# Patient Record
Sex: Female | Born: 1950 | Race: White | Hispanic: No | Smoking: Former smoker
Health system: Southern US, Community
[De-identification: ages and names within clinical notes are randomized; demographics above are authoritative.]

## PROBLEM LIST (undated history)

## (undated) DIAGNOSIS — G4700 Insomnia, unspecified: Secondary | ICD-10-CM

## (undated) DIAGNOSIS — R112 Nausea with vomiting, unspecified: Secondary | ICD-10-CM

## (undated) DIAGNOSIS — R059 Cough, unspecified: Secondary | ICD-10-CM

## (undated) DIAGNOSIS — S36039A Unspecified laceration of spleen, initial encounter: Secondary | ICD-10-CM

## (undated) DIAGNOSIS — F32A Depression, unspecified: Secondary | ICD-10-CM

## (undated) DIAGNOSIS — I509 Heart failure, unspecified: Secondary | ICD-10-CM

## (undated) DIAGNOSIS — J986 Disorders of diaphragm: Secondary | ICD-10-CM

## (undated) DIAGNOSIS — K760 Fatty (change of) liver, not elsewhere classified: Secondary | ICD-10-CM

## (undated) DIAGNOSIS — G4730 Sleep apnea, unspecified: Secondary | ICD-10-CM

## (undated) DIAGNOSIS — N289 Disorder of kidney and ureter, unspecified: Secondary | ICD-10-CM

## (undated) DIAGNOSIS — I10 Essential (primary) hypertension: Secondary | ICD-10-CM

## (undated) DIAGNOSIS — M109 Gout, unspecified: Secondary | ICD-10-CM

## (undated) DIAGNOSIS — E871 Hypo-osmolality and hyponatremia: Secondary | ICD-10-CM

## (undated) DIAGNOSIS — J449 Chronic obstructive pulmonary disease, unspecified: Secondary | ICD-10-CM

## (undated) DIAGNOSIS — F329 Major depressive disorder, single episode, unspecified: Secondary | ICD-10-CM

## (undated) DIAGNOSIS — D649 Anemia, unspecified: Secondary | ICD-10-CM

## (undated) DIAGNOSIS — Z9889 Other specified postprocedural states: Secondary | ICD-10-CM

## (undated) DIAGNOSIS — R42 Dizziness and giddiness: Secondary | ICD-10-CM

## (undated) DIAGNOSIS — R05 Cough: Secondary | ICD-10-CM

## (undated) DIAGNOSIS — K769 Liver disease, unspecified: Secondary | ICD-10-CM

## (undated) HISTORY — DX: Sleep apnea, unspecified: G47.30

## (undated) HISTORY — DX: Cough: R05

## (undated) HISTORY — DX: Fatty (change of) liver, not elsewhere classified: K76.0

## (undated) HISTORY — DX: Chronic obstructive pulmonary disease, unspecified: J44.9

## (undated) HISTORY — PX: LIVER BIOPSY: SHX301

## (undated) HISTORY — PX: TONSILLECTOMY AND ADENOIDECTOMY: SUR1326

## (undated) HISTORY — DX: Insomnia, unspecified: G47.00

## (undated) HISTORY — PX: APPENDECTOMY: SHX54

## (undated) HISTORY — DX: Heart failure, unspecified: I50.9

## (undated) HISTORY — DX: Cough, unspecified: R05.9

---

## 2000-11-11 HISTORY — PX: REPLACEMENT TOTAL KNEE: SUR1224

## 2005-01-29 ENCOUNTER — Ambulatory Visit: Payer: Self-pay | Admitting: Family Medicine

## 2006-02-24 ENCOUNTER — Ambulatory Visit: Payer: Self-pay | Admitting: Family Medicine

## 2006-02-26 ENCOUNTER — Ambulatory Visit: Payer: Self-pay | Admitting: Family Medicine

## 2006-04-28 ENCOUNTER — Emergency Department: Payer: Self-pay | Admitting: Internal Medicine

## 2007-04-15 ENCOUNTER — Ambulatory Visit: Payer: Self-pay | Admitting: Family Medicine

## 2008-03-08 ENCOUNTER — Ambulatory Visit: Payer: Self-pay | Admitting: Family Medicine

## 2009-03-14 ENCOUNTER — Ambulatory Visit: Payer: Self-pay | Admitting: Family Medicine

## 2009-04-28 ENCOUNTER — Inpatient Hospital Stay: Payer: Self-pay | Admitting: Surgery

## 2009-05-11 ENCOUNTER — Ambulatory Visit: Payer: Self-pay | Admitting: Internal Medicine

## 2009-05-11 ENCOUNTER — Inpatient Hospital Stay: Payer: Self-pay | Admitting: Internal Medicine

## 2009-06-12 ENCOUNTER — Ambulatory Visit: Payer: Self-pay | Admitting: Surgery

## 2009-06-26 ENCOUNTER — Ambulatory Visit: Payer: Self-pay | Admitting: Surgery

## 2009-07-10 ENCOUNTER — Ambulatory Visit: Payer: Self-pay | Admitting: Surgery

## 2010-03-15 ENCOUNTER — Ambulatory Visit: Payer: Self-pay | Admitting: Family Medicine

## 2010-08-21 ENCOUNTER — Ambulatory Visit: Payer: Self-pay | Admitting: Family Medicine

## 2010-09-06 ENCOUNTER — Emergency Department: Payer: Self-pay | Admitting: Emergency Medicine

## 2010-09-13 ENCOUNTER — Inpatient Hospital Stay: Payer: Self-pay | Admitting: Internal Medicine

## 2010-09-20 ENCOUNTER — Ambulatory Visit: Payer: Self-pay | Admitting: Internal Medicine

## 2010-09-26 ENCOUNTER — Ambulatory Visit: Payer: Self-pay | Admitting: Internal Medicine

## 2010-10-09 ENCOUNTER — Ambulatory Visit: Payer: Self-pay | Admitting: Specialist

## 2010-10-12 ENCOUNTER — Ambulatory Visit: Payer: Self-pay | Admitting: Specialist

## 2010-10-25 ENCOUNTER — Ambulatory Visit: Payer: Self-pay | Admitting: Specialist

## 2011-02-20 ENCOUNTER — Ambulatory Visit: Payer: Self-pay | Admitting: Urology

## 2011-02-25 ENCOUNTER — Ambulatory Visit: Payer: Self-pay | Admitting: Urology

## 2012-04-06 ENCOUNTER — Ambulatory Visit: Payer: Self-pay | Admitting: General Practice

## 2012-04-06 LAB — URINALYSIS, COMPLETE
Glucose,UR: NEGATIVE mg/dL (ref 0–75)
Ketone: NEGATIVE
Nitrite: NEGATIVE
Ph: 6 (ref 4.5–8.0)
Protein: NEGATIVE
Specific Gravity: 1.006 (ref 1.003–1.030)
Squamous Epithelial: 1
WBC UR: <1 /HPF (ref 0–5)

## 2012-04-06 LAB — BASIC METABOLIC PANEL
Anion Gap: 8 (ref 7–16)
BUN: 24 mg/dL — ABNORMAL HIGH (ref 7–18)
Calcium, Total: 8.8 mg/dL (ref 8.5–10.1)
Creatinine: 1.59 mg/dL — ABNORMAL HIGH (ref 0.60–1.30)
EGFR (Non-African Amer.): 35 — ABNORMAL LOW
Glucose: 95 mg/dL (ref 65–99)
Osmolality: 280 (ref 275–301)
Sodium: 138 mmol/L (ref 136–145)

## 2012-04-06 LAB — CBC
MCH: 29.3 pg (ref 26.0–34.0)
MCHC: 32.2 g/dL (ref 32.0–36.0)
MCV: 91 fL (ref 80–100)
Platelet: 196 10*3/uL (ref 150–440)
WBC: 3.9 10*3/uL (ref 3.6–11.0)

## 2012-04-06 LAB — PROTIME-INR
INR: 1
Prothrombin Time: 13.4 secs (ref 11.5–14.7)

## 2012-04-06 LAB — MRSA PCR SCREENING

## 2012-04-06 LAB — APTT: Activated PTT: 28.8 secs (ref 23.6–35.9)

## 2012-04-08 LAB — URINE CULTURE

## 2012-04-20 ENCOUNTER — Inpatient Hospital Stay: Payer: Self-pay

## 2012-04-21 LAB — PLATELET COUNT: Platelet: 209 10*3/uL (ref 150–440)

## 2012-04-21 LAB — BASIC METABOLIC PANEL
BUN: 19 mg/dL — ABNORMAL HIGH (ref 7–18)
Calcium, Total: 8.2 mg/dL — ABNORMAL LOW (ref 8.5–10.1)
Chloride: 109 mmol/L — ABNORMAL HIGH (ref 98–107)
Glucose: 97 mg/dL (ref 65–99)
Osmolality: 283 (ref 275–301)
Sodium: 141 mmol/L (ref 136–145)

## 2012-04-22 LAB — BASIC METABOLIC PANEL
Anion Gap: 10 (ref 7–16)
BUN: 19 mg/dL — ABNORMAL HIGH (ref 7–18)
Creatinine: 1.36 mg/dL — ABNORMAL HIGH (ref 0.60–1.30)
EGFR (Non-African Amer.): 42 — ABNORMAL LOW
Glucose: 93 mg/dL (ref 65–99)
Osmolality: 278 (ref 275–301)
Potassium: 4.3 mmol/L (ref 3.5–5.1)
Sodium: 138 mmol/L (ref 136–145)

## 2012-04-22 LAB — PLATELET COUNT: Platelet: 180 10*3/uL (ref 150–440)

## 2012-04-22 LAB — HEMOGLOBIN: HGB: 10 g/dL — ABNORMAL LOW (ref 12.0–16.0)

## 2012-04-24 LAB — BASIC METABOLIC PANEL
Anion Gap: 11 (ref 7–16)
BUN: 33 mg/dL — ABNORMAL HIGH (ref 7–18)
Calcium, Total: 8.4 mg/dL — ABNORMAL LOW (ref 8.5–10.1)
Chloride: 105 mmol/L (ref 98–107)
Co2: 18 mmol/L — ABNORMAL LOW (ref 21–32)
Creatinine: 3.16 mg/dL — ABNORMAL HIGH (ref 0.60–1.30)
EGFR (African American): 18 — ABNORMAL LOW
EGFR (Non-African Amer.): 15 — ABNORMAL LOW
Glucose: 91 mg/dL (ref 65–99)
Osmolality: 275 (ref 275–301)
Potassium: 4.3 mmol/L (ref 3.5–5.1)
Sodium: 134 mmol/L — ABNORMAL LOW (ref 136–145)

## 2012-04-24 LAB — URINALYSIS, COMPLETE
Bacteria: NONE SEEN
Bilirubin,UR: NEGATIVE
Glucose,UR: NEGATIVE mg/dL (ref 0–75)
Ketone: NEGATIVE
Leukocyte Esterase: NEGATIVE
Nitrite: NEGATIVE
Ph: 6 (ref 4.5–8.0)
Protein: 30
RBC,UR: 4 /HPF (ref 0–5)
Specific Gravity: 1.012 (ref 1.003–1.030)
Squamous Epithelial: NONE SEEN
WBC UR: 1 /HPF (ref 0–5)

## 2012-04-25 LAB — CBC WITH DIFFERENTIAL/PLATELET
Eosinophil #: 0 10*3/uL (ref 0.0–0.7)
Lymphocyte #: 0.7 10*3/uL — ABNORMAL LOW (ref 1.0–3.6)
Lymphocyte %: 15.1 %
MCV: 90 fL (ref 80–100)
Neutrophil #: 3.3 10*3/uL (ref 1.4–6.5)
Neutrophil %: 72.8 %
RDW: 15.2 % — ABNORMAL HIGH (ref 11.5–14.5)

## 2012-04-25 LAB — BASIC METABOLIC PANEL
Calcium, Total: 8.6 mg/dL (ref 8.5–10.1)
Creatinine: 1.49 mg/dL — ABNORMAL HIGH (ref 0.60–1.30)
EGFR (African American): 44 — ABNORMAL LOW
EGFR (Non-African Amer.): 38 — ABNORMAL LOW
Glucose: 95 mg/dL (ref 65–99)
Potassium: 4.4 mmol/L (ref 3.5–5.1)

## 2012-04-27 LAB — CBC WITH DIFFERENTIAL/PLATELET
Basophil %: 0.3 %
Eosinophil #: 0 10*3/uL (ref 0.0–0.7)
Eosinophil %: 0.1 %
HCT: 28.1 % — ABNORMAL LOW (ref 35.0–47.0)
HGB: 9.1 g/dL — ABNORMAL LOW (ref 12.0–16.0)
Lymphocyte #: 0.9 10*3/uL — ABNORMAL LOW (ref 1.0–3.6)
Lymphocyte %: 25.4 %
MCH: 29.1 pg (ref 26.0–34.0)
MCHC: 32.4 g/dL (ref 32.0–36.0)
MCV: 90 fL (ref 80–100)
Monocyte %: 15.9 %
Neutrophil #: 2.1 10*3/uL (ref 1.4–6.5)
Neutrophil %: 58.3 %
Platelet: 264 10*3/uL (ref 150–440)
WBC: 3.6 10*3/uL (ref 3.6–11.0)

## 2012-04-27 LAB — BASIC METABOLIC PANEL
Chloride: 104 mmol/L (ref 98–107)
Creatinine: 1.01 mg/dL (ref 0.60–1.30)
EGFR (African American): >60
EGFR (Non-African Amer.): >60
Potassium: 4.6 mmol/L (ref 3.5–5.1)

## 2012-06-16 ENCOUNTER — Ambulatory Visit: Payer: Self-pay | Admitting: Family Medicine

## 2013-08-05 ENCOUNTER — Ambulatory Visit: Payer: Self-pay | Admitting: Family Medicine

## 2014-09-15 ENCOUNTER — Ambulatory Visit: Payer: Self-pay | Admitting: Family Medicine

## 2014-10-24 ENCOUNTER — Ambulatory Visit: Payer: Self-pay | Admitting: Family Medicine

## 2015-02-01 ENCOUNTER — Ambulatory Visit: Payer: Self-pay | Admitting: Gastroenterology

## 2015-03-05 NOTE — Discharge Summary (Signed)
PATIENT NAME:  Carla Huerta, Carla Huerta MR#:  [id number] DATE OF BIRTH:  [birth year]  DATE OF ADMISSION:  04/20/2012 DATE OF DISCHARGE:  04/27/2012  ADMITTING DIAGNOSIS: Degenerative arthrosis of the right knee.   DISCHARGE DIAGNOSES:  1. Degenerative arthrosis of the right knee. 2. Hypotension. 3. Increased creatinine levels.  4. Hypoxia.   CONSULTATIONS:  1. Dr. Imogene Burnhen from [facility].  2. Dr. Meredeth IdeFleming.  3. Dr. Wynelle LinkKolluru, regular renal doctor.     HISTORY: The patient is a 64 year old female who has been followed at [facility] for progression of right knee pain. The patient was noted to have bilateral knee pain but the right was more symptomatic than the left. The patient states that the right knee pain had progressed to the point that it was significantly interfering with her activities of daily living even while she was at rest. Most of the pain was localized to the medial aspect of knee. The patient was experiencing a lot of difficulty with going up and down stairs as well as even walking short distances and getting in and out of a car. She had not seen any significant improvement in her condition despite cortisone injections as well as Synvisc. She has been unable to tolerate anti-inflammatory medications due to renal insufficiency. X-rays taken in the Orthopedic Department of [facility] showed severe degenerative changes in a tricompartmental fashion with varus deformity. After discussion of the risks and benefits of surgical intervention, the patient expressed her understanding of the risks and benefits and agreed for plans for surgical intervention.   PROCEDURE: Right total knee arthroplasty using computer-assisted navigation. Anesthesia:  Femoral nerve block with spinal and general. Soft tissue release: Anterior cruciate ligament, posterior cruciate ligament, deep medial collateral ligaments, as well as patellofemoral ligament. Implants utilized: DePuy PFC Sigma size 3 posterior  stabilized femoral component (cemented), size 3 MBT tibial component (cemented), 35 mm three-pegged oval dome patella (cemented), and a 15 mm stabilized rotating platform polyethylene insert.   HOSPITAL COURSE: The patient tolerated the procedure very well. She had no complications. She was then taken to the PAC-U where she was stabilized and then transferred to the Orthopedic floor. The patient began receiving anticoagulation therapy of Lovenox 30 mg subcutaneous every 12 hours per Anesthesia protocol. This was later changed to 40 mg daily once she had an increasingly elevated creatinine. The patient was also fitted with TED stockings bilaterally. These were allowed to be removed one hour per 8-hour shift. The right one was applied on day two following removal of the Hemovac and dressing change. She was fitted with the AVI compression foot pumps bilaterally set at 80 mmHg. Her calves have been nontender, free of any evidence of any deep venous thrombosis. Negative Homans sign. Her heels were elevated off the bed using rolled towels.   The patient was noted to have some difficulty with hypotension, and subsequently a medical consult was obtained by the Hospitalist. Her medications were adjusted and she seemed to tolerate this. She was having episodes of dizziness when she was ambulating and was noted to be with extremely difficult breathing after being up for a while. Her oxygen levels were dropping into the upper 80s. She was placed on 3 liters of oxygen. Throughout the hospital course, her oxygen level was maintained in the low 90s. A Pulmonary consult was obtained. After some adjustment for medication and some breathing treatments, the patient was able to wean from the O2 level and had normal saturation upon being discharged. The patient was  tentatively scheduled to be discharged from the hospital on the 14th, but at that time she was noted to have a significant increase in her creatinine level from 1.36 to  3.16. A Nephrology consult was obtained. A bolus of fluid was given as well as IV fluids. This seemed to correct it, and upon being discharged her laboratory studies were all within normal range. Hemoglobin was 9.2. There were no transfusions needed during the hospital course. Overall hospital course has been an unremarkable other than some modification of her blood pressure medicines and O2 medications.   Physical therapy was initiated on day one for gait training and transfers. As stated above, this was with difficulty secondary to deconditioning and her lung functions. However, she was able to ambulate 80 feet. Occupational therapy was also initiated on day one for activities of daily living and assistive devices.   DISPOSITION: The patient is being discharged to a skilled nursing facility in improved stable condition.   DISCHARGE INSTRUCTIONS:  1. She may weight bear as tolerated.  2. She will receive physical therapy for gait training and transfers.  3. Occupational therapy for activities of daily living and assistive devices.  4. She is placed on an ADA diet.  5. Incentive spirometer every1 hour while awake.  6. CPAP machine at night.  7. Encourage cough, deep breathing every 2 hours while awake.  8. TED stockings are to be worn during the day but may be removed at night.  9. Elevate heels off the bed.  10. She has a follow-up appointment with Van Clines on June 25th at 10:15,  Dr. Ernest Pine on July 25th.    DRUG ALLERGIES: No known drug allergies.   MEDICATIONS:  1. Tylenol ES 500 to 1000 mg every 4 hours p.r.n.  2. Roxicodone 5 to 10 mg every 4 hours p.r.n.  3. Tramadol 100 mg every 4 hours p.r.n.  4. Dulcolax suppository 10 mg rectally daily p.r.n. for constipation.  5. Milk of magnesia 30 mL b.i.d.  6. Enema with soapsuds if no results with milk of magnesia or Dulcolax daily.  7.  Malanta  DS 30 mL every 6 hours p.r.n.   8. Senokot S 1 tablet b.i.d.   9. Neurontin 600 mg b.i.d.    10. Zestril 10 mg daily.  11. Crestor 10 mg at bedtime. 12. Prilosec 40 mg at 6 a.m.  13. Tofranil 75 mg at bedtime.  14. Omega-3 fatty acid 2 grams daily.  15. Advair Diskus 250/50 inhaler, 1 puff b.i.d.    16. Albuterol oral inhaler 2 puffs every 6 hours p.r.n. for shortness of breath with a spacer.  17. Lovenox 40 mg daily for 14 days, then discontinue and begin taking one 81 mg enteric-coated aspirin.   PAST MEDICAL HISTORY:  1. Chickenpox.  2. Lung disease.  3. Anemia.  4. Hypertension.  5. Hyperlipidemia.  6. Paralyzed diaphragm. 7. Slight chronic obstructive pulmonary disease.  8. Cystic fibrosis.  9. Renal insufficiency.  10. Sleep apnea.  ____________________________ Van Clines, PA jrw:cbb D: 04/27/2012 10:34:48 ET T: 04/27/2012 10:49:41 ET JOB#: [id number]  cc: Van Clines, PA, <Dictator> JON WOLFE PA ELECTRONICALLY SIGNED 04/30/2012 21:44

## 2015-03-05 NOTE — Discharge Summary (Signed)
PATIENT NAME:  Carla Huerta, Carla Huerta MR#:  [id number] DATE OF BIRTH:  Jan 31, 1951  DATE OF ADMISSION:  04/20/2012 DATE OF DISCHARGE:  04/23/2012  ADMITTING DIAGNOSIS: Degenerative arthrosis of right knee.   DISCHARGE DIAGNOSIS: Degenerative arthrosis of right knee.    HISTORY: Patient is a 64 year old pleasant female who has been followed at [facility] for progression of bilateral knee pain with the right being more symptomatic than the left. Patient's right knee had progressed to the point that it was significantly interfering with her activities of daily living. She was noted to have pain even while nonweightbearing and at rest. She had localized most of the pain along the medial aspect of the knee. Patient was having a lot of difficulty with going up and down stairs as well as ambulating short distance as well getting in and out of car. Previous treatments have consisted of cortisone injections and Synvisc injections with no notable improvement. She has been unable to take anti-inflammatory due to renal insufficiency. Patient states that the pain increased to the point that it was significantly interfering with her activities of daily living. X-rays taken in the [facility] orthopedic department showed severe degenerative changes in a tricompartmental fashion with varus deformity noted. After discussion of the risks and benefits of surgical intervention the patient expressed her understanding of the risks and benefits and agreed for plans for surgical intervention.   PROCEDURE: Right total knee arthroplasty using computer-assisted navigation.   ANESTHESIA: Femoral nerve block, spinal and general.   SOFT TISSUE RELEASE: Anterior cruciate ligament, posterior cruciate ligament, deep medial collateral ligaments, as well as the patellofemoral ligament.   IMPLANTS UTILIZED: DePuy PFC Sigma size 3 posterior stabilized femoral component (cemented), size 3 MBT tibial component (cemented), 35 mm three  pegged oval dome patella (cemented), and a 15 mm stabilized rotating platform polyethylene insert.   HOSPITAL COURSE: Patient tolerated procedure very well. She had no complications. She was then taken to PAC-U where she was stabilized then transferred to the orthopedic floor. She began receiving anticoagulation therapy per hospital pharmacy protocol and anesthesia. This consisted of 30 mg sub-Q q.12 hours. She was fitted with TED stockings bilaterally. These were allowed to be removed one hour per eight hour shift. The right one was applied on day two following removal of the Hemovac and the dressing change. She was fitted with the AVI compression foot pumps bilaterally set at 80 mmHg. Her calves have been nontender, free of any evidence of any deep venous thromboses. Negative Homans sign. Heels were elevated off the bed using rolled towels.   Patient denied any chest pain or shortness of breath. Vital signs have been stable. She has been afebrile. Hemodynamically she was stable. No transfusions were given other than the Autovac transfusions given the first six hours postoperatively.   Physical therapy was initiated on day one for gait training and transfers. She is doing very well with range of motion of the knee but ambulation has been somewhat slow secondary to her partially paralyzed lung, however, she was done very well and has been participating well. Occupational therapy was also initiated on day one for activities of daily living and assisted devices.   Patient's IV, Foley and Hemovac were all discontinued on day two along with dressing change. The wound was free of any drainage or any signs of infection. The Polar Care was reapplied to the surgical leg maintaining a temperature of 40 to 50 degrees Fahrenheit.   DISPOSITION: Patient is being discharged to home  in improved stable condition.   DISCHARGE INSTRUCTIONS:  1. She may weight bear as tolerated. Continue using a walker until cleared by  physical therapy to go to a quad cane.  2. She will receive home health physical therapy.  3. She is to wear her stockings during the day but may be removed at night.  4. Recommend that she use the Polar Care as much as she can around-the-clock maintaining a temperature of 40 to 50 degrees Fahrenheit.  5. She is placed on a regular diet.  6. She has a follow-up appointment in two weeks. She is to call the clinic sooner if any temperatures of 101.5 or greater or excessive bleeding.  7. She is to resume regular medication that she was on prior to admission. She was given a prescription for Lovenox 40 mg daily for 14 days, then discontinue and begin taking one 81 mg enteric-coated aspirin. Also given another prescription for oxycodone 5 to 10 mg every 4 to 6 hours p.r.n. for pain and Ultram 50 to 100 mg every 4 to 6 hours p.r.n. for pain.   PAST MEDICAL HISTORY:  1. Chickenpox.  2. Lung disease.  3. Anemia.  4. Hypertension.  5. Hyperlipidemia.  6. Paralyzed diaphragm. 7. Slight chronic obstructive pulmonary disease. 8. Cystic fibrosis. 9. Renal insufficiency.  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  11. Sleep apnea.  ____________________________ Van ClinesJon Lema Heinkel, PA jrw:cms D: 04/23/2012 07:41:24 ET T: 04/23/2012 12:05:34 ET JOB#: [id number]  cc: Van ClinesJon Donne Robillard, PA, <Dictator> Vernor Monnig PA ELECTRONICALLY SIGNED 04/23/2012 21:33

## 2015-03-05 NOTE — Op Note (Signed)
PATIENT NAME:  Carla Huerta, Nema E MR#:  [id number] DATE OF BIRTH:  November 13, 1950  DATE OF PROCEDURE:  04/20/2012  PROCEDURE: Right femoral nerve block.   SURGEON: Aamna Mallozzi P. Rubye OaksPalmer, MD  INDICATION: To help this patient with postoperative pain about to have a right total knee replacement by Dr. Francesco SorJames Hooten.   DESCRIPTION OF PROCEDURE: The risks and benefits of general anesthesia, spinal anesthesia and the femoral nerve block were discussed with the patient and her family preoperatively in same day surgery. Consent was obtained. The patient opted for a subarachnoid block and femoral nerve block to help with postoperative pain with general anesthesia as a backup. The patient was brought to the PAC-U preoperatively. The usual monitors were applied. She was placed on nasal cannula oxygen. Her oxygen saturation was 99% despite her paralyzed left hemidiaphragm. She was lightly sedated with a total of 2 mg of Versed intravenously. She was still awake, talkative, and in good verbal communication with us but much more comfortable. She had a Betadine prep of her right upper thigh and inguinal crease x3 and a sterile technique was used. A 22-gauge 2 inch Stimuplex needle was used. The usual landmarks were identified including the iliopsoas tendon and the right femoral artery. The needle was advanced approximately 1 inch lateral to the right femoral pulsation. We had good right thigh movement on the first pass without heme or paresthesia. She had a total of 30 mL of 0.25% bupivacaine with 1:400,000 of epinephrine injected incrementally. She tolerated the block without problem or complication. I am hopeful that the block will help her significantly with her postoperative pain.   ____________________________ Kirby CriglerScott P. Rubye OaksPalmer, MD spp:cms D: 04/20/2012 10:27:42 ET T: 04/20/2012 10:58:54 ET JOB#: [id number]  cc: Lorin PicketScott P. Rubye OaksPalmer, MD, <Dictator>  Heriberto AntiguaSCOTT Jamya Starry MD ELECTRONICALLY SIGNED 04/20/2012 12:29

## 2015-03-05 NOTE — Consult Note (Signed)
PATIENT NAME:  Carla Huerta, Carla Huerta MR#:  [id number] DATE OF BIRTH:  [birth year]  DATE OF CONSULTATION:  04/23/2012  REFERRING PHYSICIAN:  Francesco SorJames Hooten, MD  CONSULTING PHYSICIAN:  Shaune PollackQing Felicity Penix, MD  PRIMARY CARE PHYSICIAN: Dr. Hillery AldoSarah Patel   REASON FOR CONSULTATION: Hypotension status post knee replacement.   HISTORY OF PRESENT ILLNESS: The patient is a 64 year old Caucasian female with a history of hypertension, hyperlipidemia, gastroesophageal reflux disease, and history of splenic laceration who had a right knee replacement three days ago for arthrosis by Dr. Ernest PineHooten. The patient developed dizziness today and was noted to have a low blood pressure at 88/59 so [facility] physician was called for consult for hypotension. I ordered normal saline bolus 500 mL and then changed to 150 mL/h and now blood pressure has increased to 102/60. The patient denies any chest pain, palpitation, or orthopnea. No nocturnal dyspnea. No headache or dizziness.    PAST MEDICAL HISTORY:  1. Hypertension. 2. Hyperlipidemia. 3. Gastroesophageal reflux disease.  4. Chronic obstructive pulmonary disease. 5. Obstructive sleep apnea.   PAST SURGICAL HISTORY: Right knee replacement.   FAMILY HISTORY: Both parents had heart attack.   SOCIAL HISTORY: No smoking, alcohol drinking, or illicit drugs.   REVIEW OF SYSTEMS: CONSTITUTIONAL: The patient denies any fever or chills. No headache but has dizziness and mild weakness. HEENT: No double vision, blurred vision, epistaxis, or postnasal drip. No dysphagia. CARDIOVASCULAR: No chest pain, palpitation, orthopnea, or nocturnal dyspnea. No leg edema. PULMONARY: No cough, sputum, shortness of breath, or hemoptysis. GI: No abdominal pain, nausea, vomiting, or diarrhea. No melena or bloody stool. GU: No dysuria or hematuria. No incontinence. SKIN: No rash or jaundice. NEUROLOGY: No syncope, loss of consciousness or seizure but has dizziness.   ALLERGIES: None.   MEDICATIONS:   1. Ancef 1 gram IV one dose. 2. Tylenol 500 to 1000 mg p.o. q.4 hours p.r.n.  3. Dulcolax suppository 10 mg rectal daily p.r.n. for constipation.  4. Senokot one tablet p.o. b.i.d.  5. Anzemet injection 12.5 mg IV push q.6 hours p.r.n. for nausea and vomiting.  6. Neurontin 600 mg p.o. b.i.d.  7. Imipramine tablets 75 mg at bedtime.  8. Lisinopril 10 mg p.o. daily.  9. Milk of Magnesia 30 mL p.o. p.r.n. for constipation.  10. Morphine 1 to 5 mg IV q.4 hours p.r.n.  11. Fish Oil 2 grams p.o. daily.  12. Omeprazole 40 mg p.o. q. 6 a.m.  13. Roxicodone 5 to 10 mg p.o. q.4 hours p.r.n.  14. Crestor 10 mg p.o. daily at bedtime.  15. Tramadol 50 to 100 mg q.4 hours p.r.n. for pain.  16. Ventolin Oral Inhaler 2 puffs q.6 hours p.r.n.  17. Lovenox 40 mg p.o. sub-Q b.i.d.  18. Advair 250/50 inhaler one puff inhalation b.i.d.   PHYSICAL EXAMINATION:   VITALS: Temperature 98.3, blood pressure 102/60, pulse 112, oxygen saturation 95%.   GENERAL: The patient is alert, awake, oriented in no acute distress. Has obesity.   HEENT: Pupils round, equal, reactive to light and accommodation. Moist oral mucosa. Clear oropharynx.   NECK: Supple. No JVD or carotid bruits. No lymphadenopathy. No thyromegaly.   CARDIOVASCULAR: S1, S2 regular rate and rhythm. No murmurs or gallops.   PULMONARY: Bilateral air entry. No wheezing or rales.   ABDOMEN: Obese, soft. Bowel sounds present. It is very difficult to estimate whether the patient has organomegaly.   EXTREMITIES: No edema, clubbing, or cyanosis. Right knee status post knee replacement surgery.   NEUROLOGY: Alert and  oriented x3. No focal deficit. Sensation intact.   LABORATORY DATA: Hemoglobin 9.2, glucose 93, BUN 19, creatinine 1.36, sodium 139, potassium 4.3, chloride 107, bicarb 21, platelets 180.   IMPRESSION:  1. Hypotension possibly due to hypovolemia. 2. Dehydration.  3. Tachycardia due to hypotension.  4. Chronic obstructive pulmonary  disease.  5. Obstructive sleep apnea. 6. CKD, stable.  7. Anemia.   RECOMMENDATIONS:  1. We will hold lisinopril.  2. Cautious pain medication. 3. The patient needs to continue normal saline IV at 150 mL/h for now and closely monitor vital signs. If her blood pressure is stable, may decrease IV fluid rate. In addition, we need to follow-up BMP in the morning and get input and output measurement. 4. For chronic obstructive pulmonary disease and obstructive sleep apnea, follow-up with Dr. Meredeth Ide.   Discussed the patient's situation, plan, and recommendations with the patient and the patient's husband.   TIME SPENT: About 50 minutes.  ____________________________ Shaune Pollack, MD qc:drc D: 04/23/2012 17:09:37 ET T: 04/24/2012 07:27:09 ET JOB#: [id number] Shaune Pollack MD ELECTRONICALLY SIGNED 04/26/2012 14:39

## 2015-03-05 NOTE — Op Note (Signed)
PATIENT NAME:  Carla Huerta, Carla Huerta MR#:  [id number] DATE OF BIRTH:  06-15-1951  DATE OF PROCEDURE:  04/20/2012  PREOPERATIVE DIAGNOSIS: Degenerative arthrosis of the right knee.   POSTOPERATIVE DIAGNOSIS: Degenerative arthrosis of the right knee.   PROCEDURE PERFORMED: Right total knee arthroplasty using computer-assisted navigation.   SURGEON: Illene Labrador. Angie Fava., MD   ASSISTANT: Van Clines, PA-C (required to maintain retraction throughout the procedure)   ANESTHESIA: Femoral nerve block, spinal, and general.   ESTIMATED BLOOD LOSS: 100 mL.   FLUIDS REPLACED: 1600 mL of Crystalloid.   TOURNIQUET TIME: 83 minutes.   DRAINS: Two medium drains to reinfusion system.   SOFT TISSUE RELEASES: Anterior cruciate ligament, posterior cruciate ligament, deep medial collateral ligament, and patellofemoral ligament.   IMPLANTS UTILIZED: DePuy PFC Sigma size 3 posterior stabilized femoral component (cemented), size 3 MBT tibial component (cemented), 35 mm three peg oval dome patella (cemented), and a 15 mm stabilized rotating platform polyethylene insert.   INDICATIONS FOR SURGERY: The patient is a 64 year old female who has been seen for complaints of severe progressive right knee pain. X-rays demonstrated severe degenerative changes in a tricompartmental fashion with varus deformity. After discussion of the risks and benefits of surgical intervention, the patient expressed her understanding of the risks and benefits and agreed with plans for surgical intervention.   PROCEDURE IN DETAIL: The patient was brought in the operating room and, after adequate femoral nerve block and first spinal and subsequently general anesthesia was achieved, a tourniquet was placed on the patient's upper right thigh. The patient's right knee and leg were cleaned and prepped with alcohol and DuraPrep and draped in the usual sterile fashion. A "time-out" was performed as per usual protocol. The right lower extremity was  exsanguinated using an Esmarch and the tourniquet was inflated to 300 mmHg. Anterior longitudinal incision was made followed by a standard mid vastus approach. A moderate effusion was evacuated. Deep fibers of the medial collateral ligament were elevated in a subperiosteal fashion off the medial flare of the tibia so as to maintain a continuous soft tissue sleeve. The patella was subluxed laterally and the patellofemoral ligament was incised. Inspection of the knee demonstrated severe degenerative changes most notably to the medial compartment. Prominent osteophytes were debrided using a rongeur. Anterior and posterior cruciate ligaments were excised. Two 4.0 mm Schanz pins were inserted into the femur and into the tibia for attachment of the ray of trackers used for computer-assisted navigation. Hip center was identified using circumduction technique. Distal landmarks were mapped using the computer. Distal femur and proximal tibia were mapped using the computer. Distal femoral cutting guide was positioned using computer-assisted navigation so as to achieve a 5 degree distal cut. Cut was performed and verified using the computer. Distal femur was sized and it was felt that a size 3 femoral component was appropriate. Femoral cutting guide was positioned and the anterior cut was performed and verified using the computer. This was followed by completion of the posterior and chamfer cuts. Femoral cutting guide for central box was then positioned and central box cut was performed.   Attention was then directed to the proximal tibia. Medial and lateral menisci were excised. The extramedullary tibial cutting guide was positioned using computer-assisted navigation so as to achieve a 0 degree varus valgus alignment and 0 degree posterior slope. Cut was performed and verified using the computer. Proximal tibia was sized and it was felt that a size 3 tibial tray was appropriate. Tibial and femoral trials were  inserted  followed by insertion of first a 10 and subsequently a 12.5, and finally a 15 mm polyethylene trial. This allowed for excellent mediolateral soft tissue balancing both in full extension and in flexion. Finally, the patella was cut and prepared so as to accommodate a 35 mm three peg oval dome patella. Patellar trial was placed and the knee was placed through a range of motion. Excellent patellar tracking was appreciated.   Femoral trial was removed. Central post hole for the tibial component was reamed followed by insertion of a keel punch. Tibial trial was then removed. Cut surfaces of bone were irrigated with copious amounts of normal saline with antibiotic solution using pulsatile lavage and then suctioned dry. Polymethyl methacrylate cement with gentamicin was prepared in the usual fashion using a vacuum mixer. Cement was applied to the cut surface of the proximal tibia as well as along the undersurface of a size 3 MBT tibial component. Tibial component was positioned and impacted into place. Excess cement was removed using freer elevators. Cement was then applied to the cut surface of the femur as well as along the posterior flanges of size 3 posterior stabilized femoral component. Femoral component was positioned and impacted into place. Excess cement was removed using freer elevators. Finally, cement was applied to the backside of a 35 mm three peg oval dome patella and patellar component was positioned and patellar clamp applied. Excess cement was removed using freer elevators.   After adequate curing of cement, tourniquet was deflated after a total tourniquet time of 83 minutes. Hemostasis was achieved using electrocautery. The knee was irrigated with copious amounts of normal saline with antibiotic solution using pulsatile lavage and then suctioned dry. The knee was inspected for any residual cement debris. 30 mL of 0.25% Marcaine with epinephrine was injected along the posterior capsule. A 15 mm  stabilized rotating platform polyethylene insert was inserted and the knee was placed through a range of motion. Excellent mediolateral soft tissue balancing was appreciated and excellent patellar tracking appreciated. Two medium drains were placed in the wound bed and brought out through a separate stab incision to be attached to a reinfusion system. The medial parapatellar portion of the incision was reapproximated using interrupted sutures of #1 Vicryl. Subcutaneous tissue was approximated in layers using first #0 Vicryl followed by #2-0 Vicryl. Skin was closed with skin staples. Sterile dressing was applied.   The patient tolerated the procedure well. She was transported to the recovery room in stable condition.   ____________________________ Illene LabradorJames P. Angie FavaHooten Jr., MD jph:drc D: 04/20/2012 23:18:39 ET T: 04/21/2012 11:24:37 ET JOB#: [id number]  cc: Fayrene FearingJames P. Angie FavaHooten Jr., MD, <Dictator> Illene LabradorJAMES P Angie FavaHOOTEN JR MD ELECTRONICALLY SIGNED 05/01/2012 12:39

## 2015-03-06 LAB — SURGICAL PATHOLOGY

## 2015-06-05 LAB — CBC WITH DIFFERENTIAL/PLATELET
BASOS ABS: 0 10*3/uL (ref 0.0–0.1)
Basophil %: 0.4 %
Eosinophil #: 0 10*3/uL (ref 0.0–0.7)
Eosinophil %: 0.1 %
HCT: 37.7 % (ref 35.0–47.0)
HGB: 12.6 g/dL (ref 12.0–16.0)
LYMPHS PCT: 25.4 %
Lymphocyte #: 1.3 10*3/uL (ref 1.0–3.6)
MCH: 30.9 pg (ref 26.0–34.0)
MCHC: 33.5 g/dL (ref 32.0–36.0)
MCV: 92 fL (ref 80–100)
Monocyte #: 0.6 x10 3/mm (ref 0.2–0.9)
Monocyte %: 10.6 %
NEUTROS PCT: 63.5 %
Neutrophil #: 3.3 10*3/uL (ref 1.4–6.5)
Other Cells Blood: 0
Platelet: 220 10*3/uL (ref 150–440)
RBC: 4.08 10*6/uL (ref 3.80–5.20)
RDW: 15.5 % — ABNORMAL HIGH (ref 11.5–14.5)
WBC: 5.2 10*3/uL (ref 3.6–11.0)

## 2015-06-05 LAB — PROTIME-INR
INR: 1
Prothrombin Time: 13.8 secs

## 2016-07-25 ENCOUNTER — Encounter: Payer: Self-pay | Admitting: *Deleted

## 2016-07-25 ENCOUNTER — Emergency Department: Payer: Medicare HMO

## 2016-07-25 ENCOUNTER — Observation Stay
Admission: EM | Admit: 2016-07-25 | Discharge: 2016-07-26 | Disposition: A | Discharge status: Home / Self Care | Payer: Medicare HMO | Attending: Specialist | Admitting: Specialist

## 2016-07-25 DIAGNOSIS — Z6841 Body Mass Index (BMI) 40.0 and over, adult: Secondary | ICD-10-CM | POA: Diagnosis not present

## 2016-07-25 DIAGNOSIS — R32 Unspecified urinary incontinence: Secondary | ICD-10-CM | POA: Diagnosis not present

## 2016-07-25 DIAGNOSIS — Z87891 Personal history of nicotine dependence: Secondary | ICD-10-CM | POA: Insufficient documentation

## 2016-07-25 DIAGNOSIS — I509 Heart failure, unspecified: Secondary | ICD-10-CM | POA: Insufficient documentation

## 2016-07-25 DIAGNOSIS — M109 Gout, unspecified: Secondary | ICD-10-CM | POA: Diagnosis not present

## 2016-07-25 DIAGNOSIS — D509 Iron deficiency anemia, unspecified: Secondary | ICD-10-CM | POA: Diagnosis not present

## 2016-07-25 DIAGNOSIS — I130 Hypertensive heart and chronic kidney disease with heart failure and stage 1 through stage 4 chronic kidney disease, or unspecified chronic kidney disease: Secondary | ICD-10-CM | POA: Insufficient documentation

## 2016-07-25 DIAGNOSIS — K8020 Calculus of gallbladder without cholecystitis without obstruction: Secondary | ICD-10-CM | POA: Insufficient documentation

## 2016-07-25 DIAGNOSIS — D649 Anemia, unspecified: Secondary | ICD-10-CM | POA: Diagnosis present

## 2016-07-25 DIAGNOSIS — R42 Dizziness and giddiness: Secondary | ICD-10-CM | POA: Diagnosis present

## 2016-07-25 DIAGNOSIS — Z791 Long term (current) use of non-steroidal anti-inflammatories (NSAID): Secondary | ICD-10-CM | POA: Diagnosis not present

## 2016-07-25 DIAGNOSIS — Z7982 Long term (current) use of aspirin: Secondary | ICD-10-CM | POA: Insufficient documentation

## 2016-07-25 DIAGNOSIS — R0602 Shortness of breath: Secondary | ICD-10-CM

## 2016-07-25 DIAGNOSIS — E669 Obesity, unspecified: Secondary | ICD-10-CM | POA: Diagnosis not present

## 2016-07-25 DIAGNOSIS — I700 Atherosclerosis of aorta: Secondary | ICD-10-CM | POA: Diagnosis not present

## 2016-07-25 DIAGNOSIS — Z79899 Other long term (current) drug therapy: Secondary | ICD-10-CM | POA: Insufficient documentation

## 2016-07-25 DIAGNOSIS — F329 Major depressive disorder, single episode, unspecified: Secondary | ICD-10-CM | POA: Insufficient documentation

## 2016-07-25 DIAGNOSIS — E785 Hyperlipidemia, unspecified: Secondary | ICD-10-CM | POA: Diagnosis not present

## 2016-07-25 DIAGNOSIS — N183 Chronic kidney disease, stage 3 (moderate): Secondary | ICD-10-CM | POA: Diagnosis not present

## 2016-07-25 DIAGNOSIS — K219 Gastro-esophageal reflux disease without esophagitis: Secondary | ICD-10-CM | POA: Diagnosis not present

## 2016-07-25 DIAGNOSIS — D61818 Other pancytopenia: Secondary | ICD-10-CM | POA: Diagnosis present

## 2016-07-25 HISTORY — DX: Major depressive disorder, single episode, unspecified: F32.9

## 2016-07-25 HISTORY — DX: Dizziness and giddiness: R42

## 2016-07-25 HISTORY — DX: Gout, unspecified: M10.9

## 2016-07-25 HISTORY — DX: Liver disease, unspecified: K76.9

## 2016-07-25 HISTORY — DX: Essential (primary) hypertension: I10

## 2016-07-25 HISTORY — DX: Depression, unspecified: F32.A

## 2016-07-25 HISTORY — DX: Anemia, unspecified: D64.9

## 2016-07-25 HISTORY — DX: Disorder of kidney and ureter, unspecified: N28.9

## 2016-07-25 HISTORY — DX: Disorders of diaphragm: J98.6

## 2016-07-25 HISTORY — DX: Unspecified laceration of spleen, initial encounter: S36.039A

## 2016-07-25 HISTORY — DX: Hypo-osmolality and hyponatremia: E87.1

## 2016-07-25 LAB — TECHNOLOGIST SMEAR REVIEW

## 2016-07-25 LAB — IRON AND TIBC
IRON: 9 ug/dL — AB (ref 28–170)
SATURATION RATIOS: 2 % — AB (ref 10.4–31.8)
TIBC: 535 ug/dL — ABNORMAL HIGH (ref 250–450)
UIBC: 526 ug/dL

## 2016-07-25 LAB — CBC WITH DIFFERENTIAL/PLATELET
Basophils Absolute: 0 10*3/uL (ref 0–0.1)
Basophils Relative: 1 %
Eosinophils Absolute: 0 10*3/uL (ref 0–0.7)
Eosinophils Relative: 0 %
HCT: 17.3 % — ABNORMAL LOW (ref 35.0–47.0)
HEMOGLOBIN: 5.1 g/dL — AB (ref 12.0–16.0)
LYMPHS PCT: 25 %
Lymphs Abs: 1.1 10*3/uL (ref 1.0–3.6)
MCH: 16.4 pg — AB (ref 26.0–34.0)
MCHC: 29.2 g/dL — ABNORMAL LOW (ref 32.0–36.0)
MCV: 56.3 fL — AB (ref 80.0–100.0)
Monocytes Absolute: 0.7 10*3/uL (ref 0.2–0.9)
Monocytes Relative: 15 %
NEUTROS ABS: 2.7 10*3/uL (ref 1.4–6.5)
Neutrophils Relative %: 59 %
Platelets: 210 10*3/uL (ref 150–440)
RBC: 3.08 MIL/uL — AB (ref 3.80–5.20)
RDW: 22.1 % — ABNORMAL HIGH (ref 11.5–14.5)
WBC: 4.5 10*3/uL (ref 3.6–11.0)

## 2016-07-25 LAB — PROTIME-INR
INR: 1.16
Prothrombin Time: 14.9 s (ref 11.4–15.2)

## 2016-07-25 LAB — COMPREHENSIVE METABOLIC PANEL
ALK PHOS: 126 U/L (ref 38–126)
ALT: 21 U/L (ref 14–54)
AST: 37 U/L (ref 15–41)
Albumin: 3.7 g/dL (ref 3.5–5.0)
Anion gap: 8 (ref 5–15)
BUN: 28 mg/dL — AB (ref 6–20)
CALCIUM: 9 mg/dL (ref 8.9–10.3)
CO2: 23 mmol/L (ref 22–32)
CREATININE: 1.72 mg/dL — AB (ref 0.44–1.00)
Chloride: 105 mmol/L (ref 101–111)
GFR calc non Af Amer: 30 mL/min — ABNORMAL LOW (ref >60)
GFR, EST AFRICAN AMERICAN: 35 mL/min — AB (ref >60)
Glucose, Bld: 81 mg/dL (ref 65–99)
Potassium: 4.5 mmol/L (ref 3.5–5.1)
SODIUM: 136 mmol/L (ref 135–145)
Total Bilirubin: 0.5 mg/dL (ref 0.3–1.2)
Total Protein: 7.1 g/dL (ref 6.5–8.1)

## 2016-07-25 LAB — FERRITIN: FERRITIN: 6 ng/mL — AB (ref 11–307)

## 2016-07-25 LAB — APTT: aPTT: 30 s (ref 24–36)

## 2016-07-25 LAB — PREPARE RBC (CROSSMATCH)

## 2016-07-25 LAB — RETICULOCYTES
RBC.: 3.12 MIL/uL — AB (ref 3.80–5.20)
RETIC COUNT ABSOLUTE: 65.5 10*3/uL (ref 19.0–183.0)
Retic Ct Pct: 2.1 % (ref 0.4–3.1)

## 2016-07-25 LAB — ABO/RH: ABO/RH(D): O POS

## 2016-07-25 LAB — TROPONIN I

## 2016-07-25 MED ORDER — SODIUM CHLORIDE 0.9% FLUSH
3.0000 mL | Freq: Two times a day (BID) | INTRAVENOUS | Status: DC
  Administered 2016-07-25 – 2016-07-26 (×2): 3 mL via INTRAVENOUS

## 2016-07-25 MED ORDER — ACETAMINOPHEN 325 MG PO TABS: 650.0000 mg | ORAL_TABLET | Freq: Four times a day (QID) | ORAL | Status: DC | PRN

## 2016-07-25 MED ORDER — SODIUM CHLORIDE 0.9% FLUSH: 3.0000 mL | INTRAVENOUS | Status: DC | PRN

## 2016-07-25 MED ORDER — BISACODYL 5 MG PO TBEC: 5.0000 mg | DELAYED_RELEASE_TABLET | Freq: Every day | ORAL | Status: DC | PRN

## 2016-07-25 MED ORDER — SODIUM CHLORIDE 0.9 % IV SOLN
10.0000 mL/h | Freq: Once | INTRAVENOUS | Status: AC
  Administered 2016-07-25: 10 mL/h via INTRAVENOUS

## 2016-07-25 MED ORDER — ALBUTEROL SULFATE (2.5 MG/3ML) 0.083% IN NEBU: 2.5000 mg | INHALATION_SOLUTION | RESPIRATORY_TRACT | Status: DC | PRN

## 2016-07-25 MED ORDER — ONDANSETRON HCL 4 MG/2ML IJ SOLN: 4.0000 mg | Freq: Four times a day (QID) | INTRAMUSCULAR | Status: DC | PRN

## 2016-07-25 MED ORDER — SODIUM CHLORIDE 0.9 % IV SOLN: 250.0000 mL | INTRAVENOUS | Status: DC | PRN

## 2016-07-25 MED ORDER — ONDANSETRON HCL 4 MG PO TABS: 4.0000 mg | ORAL_TABLET | Freq: Four times a day (QID) | ORAL | Status: DC | PRN

## 2016-07-25 MED ORDER — ACETAMINOPHEN 650 MG RE SUPP
650.0000 mg | Freq: Four times a day (QID) | RECTAL | Status: DC | PRN
Start: 2016-07-25 — End: 2016-07-26

## 2016-07-25 MED ORDER — POLYETHYLENE GLYCOL 3350 17 G PO PACK: 17.0000 g | PACK | Freq: Every day | ORAL | Status: DC | PRN

## 2016-07-25 NOTE — ED Provider Notes (Signed)
[facility] Emergency Department Provider Note   ____________________________________________   First MD Initiated Contact with Patient 07/25/16 1621     (approximate)  I have reviewed the triage vital signs and the nursing notes.   HISTORY  Chief Complaint Abnormal Lab and Dizziness    HPI Carla Huerta is a 65 y.o. female with history of diaphragm paralysis, hypertension, depression, anemia resents for evaluation of 2-3 weeks of shortness of breath as well as intermittent lightheadedness, gradual onset, no modifying factors, currently mild. Patient was seen by her primary care doctor is prior to arrival and told that her hemoglobin was 4, she was heard to the emergency department for further evaluation. Patient denies any hematemesis, hematuria, dark or tarry stools, blood in her stools. She has no history of GI bleed. She has not had a menstrual period in over 20 years and denies any abnormal vaginal bleeding. She denies any chest pain. She reports that sometime ago she had traumatic spleen laceration and required blood transfusions but has not had required any blood transfusion since then.   Past Medical History:  Diagnosis Date  . Anemia   . Depression   . Diaphragm paralysis   . Gallstone   . Gout   . Hypertension   . Hyponatremia   . Liver disease   . Renal disorder   . Splenic laceration   . Vertigo     Patient Active Problem List   Diagnosis Date Noted  . Anemia 07/25/2016  . Gallstone     History reviewed. No pertinent surgical history.  Prior to Admission medications   Medication Sig Start Date End Date Taking? Authorizing Provider  albuterol (PROVENTIL HFA;VENTOLIN HFA) 108 (90 Base) MCG/ACT inhaler Inhale 2 puffs into the lungs every 4 (four) hours as needed for wheezing or shortness of breath.   Yes Historical Provider, MD  aspirin 81 MG chewable tablet Chew 81 mg by mouth at bedtime.   Yes Historical Provider, MD    atorvastatin (LIPITOR) 40 MG tablet Take 40 mg by mouth at bedtime.   Yes Historical Provider, MD  cholecalciferol (VITAMIN D) 1000 units tablet Take 1,000 Units by mouth daily.   Yes Historical Provider, MD  colchicine 0.6 MG tablet Take 0.6 mg by mouth at bedtime.   Yes Historical Provider, MD  furosemide (LASIX) 20 MG tablet Take 20 mg by mouth 3 (three) times a week. On Mondays, Wednesdays, and Fridays.   Yes Historical Provider, MD  gabapentin (NEURONTIN) 300 MG capsule Take 300 mg by mouth 3 (three) times daily.   Yes Historical Provider, MD  imipramine (TOFRANIL-PM) 100 MG capsule Take 100 mg by mouth at bedtime.   Yes Historical Provider, MD  lisinopril (PRINIVIL,ZESTRIL) 10 MG tablet Take 10 mg by mouth daily.   Yes Historical Provider, MD  naproxen (NAPROSYN) 500 MG tablet Take 500 mg by mouth 2 (two) times daily with a meal.   Yes Historical Provider, MD  omeprazole (PRILOSEC) 40 MG capsule Take 40 mg by mouth at bedtime.   Yes Historical Provider, MD  tolterodine (DETROL LA) 4 MG 24 hr capsule Take 4 mg by mouth at bedtime.   Yes Historical Provider, MD    Allergies Review of patient's allergies indicates no known allergies.  History reviewed. No pertinent family history.  Social History Social History  Substance Use Topics  . Smoking status: Former Games developermoker  . Smokeless tobacco: Current User  . Alcohol use No    Review of Systems Constitutional:  No fever/chills Eyes: No visual changes. ENT: No sore throat. Cardiovascular: Denies chest pain. Respiratory: +shortness of breath. Gastrointestinal: No abdominal pain.  No nausea, no vomiting.  No diarrhea.  No constipation. Genitourinary: Negative for dysuria. Musculoskeletal: Negative for back pain. Skin: Negative for rash. Neurological: Negative for headaches, focal weakness or numbness.  10-point ROS otherwise negative.  ____________________________________________   PHYSICAL EXAM:  VITAL SIGNS: ED Triage Vitals   Enc Vitals Group     BP 07/25/16 1615 134/61     Pulse Rate 07/25/16 1615 (!) 108     Resp 07/25/16 1615 20     Temp 07/25/16 1615 99.8 F (37.7 C)     Temp Source 07/25/16 1615 Oral     SpO2 07/25/16 1615 95 %     Weight 07/25/16 1616 (!) 301 lb (136.5 kg)     Height 07/25/16 1616 5\' 6"  (1.676 m)     Head Circumference --      Peak Flow --      Pain Score --      Pain Loc --      Pain Edu? --      Excl. in GC? --     Constitutional: Alert and oriented. Nontoxic appearing and in no acute distress. Marked skin pallor. Eyes: Conjunctivae are normal. PERRL. EOMI. Head: Atraumatic. Nose: No congestion/rhinnorhea. Mouth/Throat: Mucous membranes are moist.  Oropharynx non-erythematous. Neck: No stridor.  Supple without meningismus. Cardiovascular: Mildly tachycardic rate, regular rhythm. Grossly normal heart sounds.  Good peripheral circulation. Respiratory: Normal respiratory effort.  No retractions. Lungs CTAB. Gastrointestinal: Soft and nontender. No distention.  No CVA tenderness. Genitourinary: deferred Rectal: Clear mucous in the rectal vault is guaiac negative, no blood Musculoskeletal: No lower extremity tenderness nor edema.  No joint effusions. Neurologic:  Normal speech and language. No gross focal neurologic deficits are appreciated. No gait instability. Skin:  Skin is warm, dry and intact. No rash noted. Psychiatric: Mood and affect are normal. Speech and behavior are normal.  ____________________________________________   LABS (all labs ordered are listed, but only abnormal results are displayed)  Labs Reviewed  CBC WITH DIFFERENTIAL/PLATELET - Abnormal; Notable for the following:       Result Value   RBC 3.08 (*)    Hemoglobin 5.1 (*)    HCT 17.3 (*)    MCV 56.3 (*)    MCH 16.4 (*)    MCHC 29.2 (*)    RDW 22.1 (*)    All other components within normal limits  COMPREHENSIVE METABOLIC PANEL - Abnormal; Notable for the following:    BUN 28 (*)     Creatinine, Ser 1.72 (*)    GFR calc non Af Amer 30 (*)    GFR calc Af Amer 35 (*)    All other components within normal limits  FERRITIN - Abnormal; Notable for the following:    Ferritin 6 (*)    All other components within normal limits  IRON AND TIBC - Abnormal; Notable for the following:    Iron 9 (*)    TIBC 535 (*)    Saturation Ratios 2 (*)    All other components within normal limits  RETICULOCYTES - Abnormal; Notable for the following:    RBC. 3.12 (*)    All other components within normal limits  PROTIME-INR  APTT  TROPONIN I  TECHNOLOGIST SMEAR REVIEW  CBC  TYPE AND SCREEN  PREPARE RBC (CROSSMATCH)  PREPARE RBC (CROSSMATCH)  ABO/RH   ____________________________________________  EKG  ED ECG  REPORT I, Gayla Doss, the attending physician, personally viewed and interpreted this ECG.   Date: 07/25/2016  EKG Time: 16:14  Rate: 109  Rhythm: sinus tachycardia  Axis: normal  Intervals:none  ST&T Change: No acute ST elevation or acute ST depression.  ____________________________________________  RADIOLOGY  CXR IMPRESSION:  Mild CHF with interstitial edema. No pleural effusion or pneumonia.  When the patient can tolerate the procedure, a PA and lateral chest  x-ray would be useful.    Aortic atherosclerosis.       ____________________________________________   PROCEDURES  Procedure(s) performed: None  Procedures  Critical Care performed: Yes, see critical care note(s).  CRITICAL CARE Performed by: Toney Rakes A   Total critical care time: 30 minutes  Critical care time was exclusive of separately billable procedures and treating other patients.  Critical care was necessary to treat or prevent imminent or life-threatening deterioration.  Critical care was time spent personally by me on the following activities: development of treatment plan with patient and/or surrogate as well as nursing, discussions with consultants, evaluation of  patient's response to treatment, examination of patient, obtaining history from patient or surrogate, ordering and performing treatments and interventions, ordering and review of laboratory studies, ordering and review of radiographic studies, pulse oximetry and re-evaluation of patient's condition.  ____________________________________________   INITIAL IMPRESSION / ASSESSMENT AND PLAN / ED COURSE  Pertinent labs & imaging results that were available during my care of the patient were reviewed by me and considered in my medical decision making (see chart for details).  Carla Huerta is a 65 y.o. female with history of diaphragm paralysis, hypertension, depression, anemia resents for evaluation of 2-3 weeks of shortness of breath as well as intermittent lightheadedness in the setting of diagnosed anemia today. On exam, she is nontoxic appearing and in no acute distress. Mildly tachycardic but the remainder of her vital signs are stable and she is afebrile. EKG negative so with acute ischemia. Rectal exam shows no evidence of GI bleed. At this time, I am not sure why her hemoglobin is so low. Will recheck CBC, obtain basic screening labs and anticipate admission.  ----------------------------------------- 6:48 PM on 07/25/2016 -----------------------------------------  Patient has severe Symptomatic anemia, hemoglobin 5.1. Patient has been consented to receive blood. She voices understanding of risks and benefits of transfusion and would like to move forward. She has signed consent. We'll transfuse 2 units of packed red blood cells. At this time, it is not clear to me why she is so anemic but she has no evidence of active/acute blood loss. I discussed the case with the hospitalist for admission at this time.  Clinical Course     ____________________________________________   FINAL CLINICAL IMPRESSION(S) / ED DIAGNOSES  Final diagnoses:  Symptomatic anemia  Lightheadedness  SOB  (shortness of breath)      NEW MEDICATIONS STARTED DURING THIS VISIT:  New Prescriptions   No medications on file     Note:  This document was prepared using Dragon voice recognition software and may include unintentional dictation errors.    Gayla Doss, MD 07/25/16 [phone]

## 2016-07-25 NOTE — H&P (Signed)
[facility] [place] at [facility]   PATIENT NAME: Carla Huerta    MR#:  [id number]  DATE OF BIRTH:  [birth year]  DATE OF ADMISSION:  07/25/2016  PRIMARY CARE PHYSICIAN: No primary care provider on file.   REQUESTING/REFERRING PHYSICIAN:  Dr. Inocencio HomesGayle  CHIEF COMPLAINT:   Chief Complaint  Patient presents with  . Abnormal Lab  . Dizziness    HISTORY OF PRESENT ILLNESS:  Carla Huerta  is a 65 y.o. female with a known history of Anemia, CKD stage III, hepatic steatosis, obesity, splenic laceration presents to the emergency room sent in by primary care physician due to anemia. Her hemoglobin today in the emergency room is 5.4. She has been feeling fatigued, dizzy and short of breath. No chest pain or syncope. No melena or blood in stool. No vaginal bleeding or hematuria. Stool checked for blood in the emergency room is negative.  Patient had splenic laceration from a fall 3 years back and received 4 units blood during that time.   PAST MEDICAL HISTORY:   Past Medical History:  Diagnosis Date  . Anemia   . Depression   . Diaphragm paralysis   . Gallstone   . Gout   . Hypertension   . Hyponatremia   . Liver disease   . Renal disorder   . Splenic laceration   . Vertigo     PAST SURGICAL HISTORY:  History reviewed. No pertinent surgical history.  SOCIAL HISTORY:   Social History  Substance Use Topics  . Smoking status: Former Games developermoker  . Smokeless tobacco: Current User  . Alcohol use No    FAMILY HISTORY:  History reviewed. No pertinent family history.  No history of colon cancer  DRUG ALLERGIES:  No Known Allergies  REVIEW OF SYSTEMS:   Review of Systems  Constitutional: Positive for malaise/fatigue. Negative for chills, fever and weight loss.  HENT: Negative for hearing loss and nosebleeds.   Eyes: Negative for blurred vision, double vision and pain.  Respiratory: Negative for cough, hemoptysis, sputum production, shortness of  breath and wheezing.   Cardiovascular: Negative for chest pain, palpitations, orthopnea and leg swelling.  Gastrointestinal: Negative for abdominal pain, constipation, diarrhea, nausea and vomiting.  Genitourinary: Negative for dysuria and hematuria.  Musculoskeletal: Negative for back pain, falls and myalgias.  Skin: Negative for rash.  Neurological: Positive for dizziness and weakness. Negative for tremors, sensory change, speech change, focal weakness, seizures and headaches.  Endo/Heme/Allergies: Does not bruise/bleed easily.  Psychiatric/Behavioral: Negative for depression and memory loss. The patient is not nervous/anxious.     MEDICATIONS AT HOME:   Prior to Admission medications   Medication Sig Start Date End Date Taking? Authorizing Provider  albuterol (PROVENTIL HFA;VENTOLIN HFA) 108 (90 Base) MCG/ACT inhaler Inhale 2 puffs into the lungs every 4 (four) hours as needed for wheezing or shortness of breath.   Yes Historical Provider, MD  aspirin 81 MG chewable tablet Chew 81 mg by mouth at bedtime.   Yes Historical Provider, MD  atorvastatin (LIPITOR) 40 MG tablet Take 40 mg by mouth at bedtime.   Yes Historical Provider, MD  cholecalciferol (VITAMIN D) 1000 units tablet Take 1,000 Units by mouth daily.   Yes Historical Provider, MD  colchicine 0.6 MG tablet Take 0.6 mg by mouth at bedtime.   Yes Historical Provider, MD  furosemide (LASIX) 20 MG tablet Take 20 mg by mouth 3 (three) times a week. On Mondays, Wednesdays, and Fridays.   Yes Historical Provider, MD  gabapentin (NEURONTIN) 300 MG capsule Take 300 mg by mouth 3 (three) times daily.   Yes Historical Provider, MD  imipramine (TOFRANIL-PM) 100 MG capsule Take 100 mg by mouth at bedtime.   Yes Historical Provider, MD  lisinopril (PRINIVIL,ZESTRIL) 10 MG tablet Take 10 mg by mouth daily.   Yes Historical Provider, MD  naproxen (NAPROSYN) 500 MG tablet Take 500 mg by mouth 2 (two) times daily with a meal.   Yes Historical  Provider, MD  omeprazole (PRILOSEC) 40 MG capsule Take 40 mg by mouth at bedtime.   Yes Historical Provider, MD  tolterodine (DETROL LA) 4 MG 24 hr capsule Take 4 mg by mouth at bedtime.   Yes Historical Provider, MD     VITAL SIGNS:  Blood pressure (!) 146/72, pulse (!) 101, temperature 98.7 F (37.1 C), temperature source Oral, resp. rate 13, height 5\' 6"  (1.676 m), weight (!) 136.5 kg (301 lb), SpO2 95 %.  PHYSICAL EXAMINATION:  Physical Exam  GENERAL:  65 y.o.-year-old patient lying in the bed with no acute distress. Morbidly obese. Pale EYES: Pupils equal, round, reactive to light and accommodation. No scleral icterus. Extraocular muscles intact.  HEENT: Head atraumatic, normocephalic. Oropharynx and nasopharynx clear. No oropharyngeal erythema, moist oral mucosa  NECK:  Supple, no jugular venous distention. No thyroid enlargement, no tenderness.  LUNGS: Normal breath sounds bilaterally, no wheezing, rales, rhonchi. No use of accessory muscles of respiration.  CARDIOVASCULAR: S1, S2 normal. No murmurs, rubs, or gallops.  ABDOMEN: Soft, nontender, nondistended. Bowel sounds present. No organomegaly or mass.  EXTREMITIES: No pedal edema, cyanosis, or clubbing. + 2 pedal & radial pulses b/l.   NEUROLOGIC: Cranial nerves II through XII are intact. No focal Motor or sensory deficits appreciated b/l PSYCHIATRIC: The patient is alert and oriented x 3. Good affect.  SKIN: No obvious rash, lesion, or ulcer.   LABORATORY PANEL:   CBC  Recent Labs Lab 07/25/16 1620  WBC 4.5  HGB 5.1*  HCT 17.3*  PLT 210   ------------------------------------------------------------------------------------------------------------------  Chemistries   Recent Labs Lab 07/25/16 1620  NA 136  K 4.5  CL 105  CO2 23  GLUCOSE 81  BUN 28*  CREATININE 1.72*  CALCIUM 9.0  AST 37  ALT 21  ALKPHOS 126  BILITOT 0.5    ------------------------------------------------------------------------------------------------------------------  Cardiac Enzymes  Recent Labs Lab 07/25/16 1620  TROPONINI <0.03   ------------------------------------------------------------------------------------------------------------------  RADIOLOGY:  Dg Chest Portable 1 View  Result Date: 07/25/2016 CLINICAL DATA:  Low hemoglobin. History of hypertension, former smoker. EXAM: PORTABLE CHEST 1 VIEW COMPARISON:  PA and lateral chest x-ray of September 14, 2010 FINDINGS: The study is somewhat limited due to scatter effects related to overlying soft tissues. The lungs are well-expanded. There is no pleural effusion. No alveolar infiltrate is observed. The interstitial markings are coarse. The cardiac silhouette is enlarged. The central pulmonary vascularity is mildly prominent. There is calcification in the wall of the aortic arch. The observed bony thorax is unremarkable. IMPRESSION: Mild CHF with interstitial edema. No pleural effusion or pneumonia. When the patient can tolerate the procedure, a PA and lateral chest x-ray would be useful. Aortic atherosclerosis. Electronically Signed   By: David  [place] M.D.   On: 07/25/2016 16:36     IMPRESSION AND PLAN:   * Microcytic anemia Etiology unknown. Stool is negative for Hemoccult. We'll transfuse 2 units packed RBC. Repeat hemoglobin in the morning. Check iron levels and ferritin along with reticulocyte count. Will need referral to cancer Center for  further workup. Could be due to anemia of chronic disease  * CKD stage III is stable  All the records are reviewed and case discussed with ED provider. Management plans discussed with the patient, family and they are in agreement.  CODE STATUS: FULL CODE  TOTAL TIME TAKING CARE OF THIS PATIENT:  minutes.   Milagros Loll R M.D on 07/25/2016 at 6:38 PM  Between 7am to 6pm - Pager - [phone]  After 6pm go to [email or web address] -  password [email or web address] [facility]  [facility]  Office  [phone]  CC: Primary care physician; No primary care provider on file.  Note: This dictation was prepared with Dragon dictation along with smaller phrase technology. Any transcriptional errors that result from this process are unintentional.

## 2016-07-25 NOTE — ED Triage Notes (Signed)
Pt arrives with complaints of dizziness and SOB for 2 weeks, was seen by PCP this AM and sent to ED for hgb of 4, pt denies any bloody stools or diarrhea, denies any pain, pt awake and alert

## 2016-07-26 LAB — CBC
HCT: 22.3 % — ABNORMAL LOW (ref 35.0–47.0)
HEMOGLOBIN: 7 g/dL — AB (ref 12.0–16.0)
MCH: 19.3 pg — AB (ref 26.0–34.0)
MCHC: 31.1 g/dL — AB (ref 32.0–36.0)
MCV: 62 fL — ABNORMAL LOW (ref 80.0–100.0)
PLATELETS: 197 10*3/uL (ref 150–440)
RBC: 3.6 MIL/uL — ABNORMAL LOW (ref 3.80–5.20)
RDW: 28.1 % — AB (ref 11.5–14.5)
WBC: 3.6 10*3/uL (ref 3.6–11.0)

## 2016-07-26 LAB — HEMOGLOBIN: HEMOGLOBIN: 8.3 g/dL — AB (ref 12.0–16.0)

## 2016-07-26 LAB — PREPARE RBC (CROSSMATCH)

## 2016-07-26 MED ORDER — PANTOPRAZOLE SODIUM 40 MG PO TBEC
40.0000 mg | DELAYED_RELEASE_TABLET | Freq: Every day | ORAL | Status: DC
  Administered 2016-07-26: 40 mg via ORAL
  Filled 2016-07-26: qty 1

## 2016-07-26 MED ORDER — FESOTERODINE FUMARATE ER 4 MG PO TB24
4.0000 mg | ORAL_TABLET | Freq: Every day | ORAL | Status: DC
  Administered 2016-07-26: 4 mg via ORAL
  Filled 2016-07-26: qty 1

## 2016-07-26 MED ORDER — ALBUTEROL SULFATE HFA 108 (90 BASE) MCG/ACT IN AERS: 2.0000 | INHALATION_SPRAY | RESPIRATORY_TRACT | Status: DC | PRN

## 2016-07-26 MED ORDER — FUROSEMIDE 20 MG PO TABS
20.0000 mg | ORAL_TABLET | ORAL | Status: DC
  Administered 2016-07-26: 20 mg via ORAL
  Filled 2016-07-26: qty 1

## 2016-07-26 MED ORDER — IMIPRAMINE HCL 25 MG PO TABS
50.0000 mg | ORAL_TABLET | Freq: Two times a day (BID) | ORAL | Status: DC
  Administered 2016-07-26: 50 mg via ORAL
  Filled 2016-07-26 (×2): qty 2

## 2016-07-26 MED ORDER — ATORVASTATIN CALCIUM 20 MG PO TABS: 40.0000 mg | ORAL_TABLET | Freq: Every day | ORAL | Status: DC

## 2016-07-26 MED ORDER — COLCHICINE 0.6 MG PO TABS: 0.6000 mg | ORAL_TABLET | Freq: Every day | ORAL | Status: DC

## 2016-07-26 MED ORDER — SODIUM CHLORIDE 0.9 % IV SOLN
Freq: Once | INTRAVENOUS | Status: AC
  Administered 2016-07-26: 13:00:00 via INTRAVENOUS

## 2016-07-26 MED ORDER — GABAPENTIN 300 MG PO CAPS
300.0000 mg | ORAL_CAPSULE | Freq: Three times a day (TID) | ORAL | Status: DC
  Administered 2016-07-26: 300 mg via ORAL
  Filled 2016-07-26: qty 1

## 2016-07-26 MED ORDER — VITAMIN D 1000 UNITS PO TABS
1000.0000 [IU] | ORAL_TABLET | Freq: Every day | ORAL | Status: DC
  Administered 2016-07-26: 1000 [IU] via ORAL
  Filled 2016-07-26: qty 1

## 2016-07-26 MED ORDER — LISINOPRIL 10 MG PO TABS
10.0000 mg | ORAL_TABLET | Freq: Every day | ORAL | Status: DC
  Administered 2016-07-26: 10 mg via ORAL
  Filled 2016-07-26: qty 1

## 2016-07-26 MED ORDER — FERROUS SULFATE 325 (65 FE) MG PO TBEC: 325.0000 mg | DELAYED_RELEASE_TABLET | Freq: Two times a day (BID) | ORAL | 2 refills | Status: DC

## 2016-07-26 NOTE — Progress Notes (Signed)
07/26/2016 5:09 PM  Erle CrockerEvelyn H Huerta to be D/C'd Home per MD order.  Discussed prescriptions and follow up appointments with the patient. Prescriptions given to patient, medication list explained in detail. Pt verbalized understanding.    Medication List    STOP taking these medications   naproxen 500 MG tablet Commonly known as:  NAPROSYN     TAKE these medications   albuterol 108 (90 Base) MCG/ACT inhaler Commonly known as:  PROVENTIL HFA;VENTOLIN HFA Inhale 2 puffs into the lungs every 4 (four) hours as needed for wheezing or shortness of breath.   aspirin 81 MG chewable tablet Chew 81 mg by mouth at bedtime.   atorvastatin 40 MG tablet Commonly known as:  LIPITOR Take 40 mg by mouth at bedtime.   cholecalciferol 1000 units tablet Commonly known as:  VITAMIN D Take 1,000 Units by mouth daily.   colchicine 0.6 MG tablet Take 0.6 mg by mouth at bedtime.   ferrous sulfate 325 (65 FE) MG EC tablet Take 1 tablet (325 mg total) by mouth 2 (two) times daily.   furosemide 20 MG tablet Commonly known as:  LASIX Take 20 mg by mouth 3 (three) times a week. On Mondays, Wednesdays, and Fridays.   gabapentin 300 MG capsule Commonly known as:  NEURONTIN Take 300 mg by mouth 3 (three) times daily.   imipramine 100 MG capsule Commonly known as:  TOFRANIL-PM Take 100 mg by mouth at bedtime.   lisinopril 10 MG tablet Commonly known as:  PRINIVIL,ZESTRIL Take 10 mg by mouth daily.   omeprazole 40 MG capsule Commonly known as:  PRILOSEC Take 40 mg by mouth at bedtime.   tolterodine 4 MG 24 hr capsule Commonly known as:  DETROL LA Take 4 mg by mouth at bedtime.       Vitals:   07/26/16 1604 07/26/16 1605  BP: (!) 144/74 123/68  Pulse: (!) 105 (!) 110  Resp:    Temp: 98.3 F (36.8 C)     Skin clean, dry and intact without evidence of skin break down, no evidence of skin tears noted. IV catheter discontinued intact. Site without signs and symptoms of complications.  Dressing and pressure applied. Pt denies pain at this time. No complaints noted.  An After Visit Summary was printed and given to the patient. Patient escorted via WC, and D/C home via private auto.  Bradly Chrisougherty, Tucker Steedley E

## 2016-07-26 NOTE — Discharge Summary (Signed)
[facility] - [place] at [facility]   PATIENT NAME: Carla Huerta    MR#:  [id number]  DATE OF BIRTH:  [birth year]  DATE OF ADMISSION:  07/25/2016 ADMITTING PHYSICIAN: Milagros Loll, MD  DATE OF DISCHARGE: 07/26/2016  PRIMARY CARE PHYSICIAN: No primary care provider on file.    ADMISSION DIAGNOSIS:  Lightheadedness [R42] SOB (shortness of breath) [R06.02] Symptomatic anemia [D64.9]  DISCHARGE DIAGNOSIS:  Active Problems:   Anemia   SECONDARY DIAGNOSIS:   Past Medical History:  Diagnosis Date  . Anemia   . Depression   . Diaphragm paralysis   . Gallstone   . Gout   . Hypertension   . Hyponatremia   . Liver disease   . Renal disorder   . Splenic laceration   . Vertigo     HOSPITAL COURSE:   65 year old female with past medical history of hypertension, hyperlipidemia, gout, osteoporosis, history of splenic laceration, chronic anemia who presents to the hospital due to dizziness and shortness of breath and noted to have symptomatic anemia.  1. Symptomatic anemia-patient was noted to have a severe microcytic anemia. This is likely iron deficient in nature. -She had a stool occult in the ER which was negative. No evidence of melena or hematochezia or any acute bleeding. She was transfused a total of 3 units of packed red blood cells. She is no longer dizzy or orthostatic and feels better and therefore is being discharged home. -She was discharged on oral iron supplements and follow up with hematology as an outpatient.  2. Essential hypertension-patient will continue her lisinopril.  3. History of gout-no acute attack-patient will continue her colchicine.  4. Hyperlipidemia-patient will continue atorvastatin.  5. GERD-patient will continue her omeprazole.  6. Urinary incontinence- cont. Tolterodine.     DISCHARGE CONDITIONS:   Stable.   CONSULTS OBTAINED:    DRUG ALLERGIES:  No Known Allergies  DISCHARGE MEDICATIONS:     Medication List     STOP taking these medications   naproxen 500 MG tablet Commonly known as:  NAPROSYN     TAKE these medications   albuterol 108 (90 Base) MCG/ACT inhaler Commonly known as:  PROVENTIL HFA;VENTOLIN HFA Inhale 2 puffs into the lungs every 4 (four) hours as needed for wheezing or shortness of breath.   aspirin 81 MG chewable tablet Chew 81 mg by mouth at bedtime.   atorvastatin 40 MG tablet Commonly known as:  LIPITOR Take 40 mg by mouth at bedtime.   cholecalciferol 1000 units tablet Commonly known as:  VITAMIN D Take 1,000 Units by mouth daily.   colchicine 0.6 MG tablet Take 0.6 mg by mouth at bedtime.   ferrous sulfate 325 (65 FE) MG EC tablet Take 1 tablet (325 mg total) by mouth 2 (two) times daily.   furosemide 20 MG tablet Commonly known as:  LASIX Take 20 mg by mouth 3 (three) times a week. On Mondays, Wednesdays, and Fridays.   gabapentin 300 MG capsule Commonly known as:  NEURONTIN Take 300 mg by mouth 3 (three) times daily.   imipramine 100 MG capsule Commonly known as:  TOFRANIL-PM Take 100 mg by mouth at bedtime.   lisinopril 10 MG tablet Commonly known as:  PRINIVIL,ZESTRIL Take 10 mg by mouth daily.   omeprazole 40 MG capsule Commonly known as:  PRILOSEC Take 40 mg by mouth at bedtime.   tolterodine 4 MG 24 hr capsule Commonly known as:  DETROL LA Take 4 mg by mouth at bedtime.  DISCHARGE INSTRUCTIONS:   DIET:  Cardiac diet  DISCHARGE CONDITION:  Stable  ACTIVITY:  Activity as tolerated  OXYGEN:  Home Oxygen: No.   Oxygen Delivery: room air  DISCHARGE LOCATION:  home   If you experience worsening of your admission symptoms, develop shortness of breath, life threatening emergency, suicidal or homicidal thoughts you must seek medical attention immediately by calling 911 or calling your MD immediately  if symptoms less severe.  You Must read complete instructions/literature along with all the possible adverse  reactions/side effects for all the Medicines you take and that have been prescribed to you. Take any new Medicines after you have completely understood and accpet all the possible adverse reactions/side effects.   Please note  You were cared for by a hospitalist during your hospital stay. If you have any questions about your discharge medications or the care you received while you were in the hospital after you are discharged, you can call the unit and asked to speak with the hospitalist on call if the hospitalist that took care of you is not available. Once you are discharged, your primary care physician will handle any further medical issues. Please note that NO REFILLS for any discharge medications will be authorized once you are discharged, as it is imperative that you return to your primary care physician (or establish a relationship with a primary care physician if you do not have one) for your aftercare needs so that they can reassess your need for medications and monitor your lab values.     Today   Pt. Here due to symptomatic anemia.  NO melena, hematochezia.  Feels bitter. Will give one more unit as her hg. Is still 7.0.    VITAL SIGNS:  Blood pressure (!) 143/80, pulse (!) 101, temperature 98.3 F (36.8 C), temperature source Oral, resp. rate 16, height 5\' 6"  (1.676 m), weight (!) 136.4 kg (300 lb 12.8 oz), SpO2 100 %.  I/O:   Intake/Output Summary (Last 24 hours) at 07/26/16 1601 Last data filed at 07/26/16 1540  Gross per 24 hour  Intake           1911.6 ml  Output             1800 ml  Net            111.6 ml    PHYSICAL EXAMINATION:  GENERAL:  65 y.o.-year-old patient lying in the bed with no acute distress.  EYES: Pupils equal, round, reactive to light and accommodation. No scleral icterus. Extraocular muscles intact.  Pale Conjunctiva.  HEENT: Head atraumatic, normocephalic. Oropharynx and nasopharynx clear.  NECK:  Supple, no jugular venous distention. No thyroid  enlargement, no tenderness.  LUNGS: Normal breath sounds bilaterally, no wheezing, rales,rhonchi. No use of accessory muscles of respiration.  CARDIOVASCULAR: S1, S2 normal. No murmurs, rubs, or gallops.  ABDOMEN: Soft, non-tender, non-distended. Bowel sounds present. No organomegaly or mass.  EXTREMITIES: No pedal edema, cyanosis, or clubbing.  NEUROLOGIC: Cranial nerves II through XII are intact. No focal motor or sensory defecits b/l.  PSYCHIATRIC: The patient is alert and oriented x 3. Good affect.  SKIN: No obvious rash, lesion, or ulcer.   DATA REVIEW:   CBC  Recent Labs Lab 07/26/16 0523  WBC 3.6  HGB 7.0*  HCT 22.3*  PLT 197    Chemistries   Recent Labs Lab 07/25/16 1620  NA 136  K 4.5  CL 105  CO2 23  GLUCOSE 81  BUN 28*  CREATININE 1.72*  CALCIUM 9.0  AST 37  ALT 21  ALKPHOS 126  BILITOT 0.5    Cardiac Enzymes  Recent Labs Lab 07/25/16 1620  TROPONINI <0.03    Microbiology Results  No results found for this or any previous visit.  RADIOLOGY:  Dg Chest Portable 1 View  Result Date: 07/25/2016 CLINICAL DATA:  Low hemoglobin. History of hypertension, former smoker. EXAM: PORTABLE CHEST 1 VIEW COMPARISON:  PA and lateral chest x-ray of September 14, 2010 FINDINGS: The study is somewhat limited due to scatter effects related to overlying soft tissues. The lungs are well-expanded. There is no pleural effusion. No alveolar infiltrate is observed. The interstitial markings are coarse. The cardiac silhouette is enlarged. The central pulmonary vascularity is mildly prominent. There is calcification in the wall of the aortic arch. The observed bony thorax is unremarkable. IMPRESSION: Mild CHF with interstitial edema. No pleural effusion or pneumonia. When the patient can tolerate the procedure, a PA and lateral chest x-ray would be useful. Aortic atherosclerosis. Electronically Signed   By: David  [place] M.D.   On: 07/25/2016 16:36      Management plans  discussed with the patient, family and they are in agreement.  CODE STATUS:     Code Status Orders        Start     Ordered   07/25/16 1837  Full code  Continuous     07/25/16 1837    Code Status History    Date Active Date Inactive Code Status Order ID Comments User Context   This patient has a current code status but no historical code status.      TOTAL TIME TAKING CARE OF THIS PATIENT: 40 minutes.    Houston Siren M.D on 07/26/2016 at 4:01 PM  Between 7am to 6pm - Pager - [phone]  After 6pm go to [email or web address] - Scientist, research (life sciences) [facility]  Office  [phone]  CC: Primary care physician; No primary care provider on file.

## 2016-07-26 NOTE — Care Management Obs Status (Signed)
MEDICARE OBSERVATION STATUS NOTIFICATION   Patient Details  Name: Carla Huerta MRN: [id number] Date of Birth: 02-18-1951   Medicare Observation Status Notification Given:  No (admitted observation less than 24 hours)    Chapman FitchBOWEN, Fahd Galea T, RN 07/26/2016, 1:46 PM

## 2016-07-26 NOTE — Progress Notes (Signed)
Spoke with Dr. Tobi BastosPyreddy about transfusion orders. Blood transfusion flowsheet still states that there is one unit left to be released. ER gave one unit and pt received second unit on the floor. Making sure the order was for two total units, not 1 unit in the ER and two units on the floor. MD stated to get hgb lab follow up after two units and we will monitor from there. No other transfusions at this time.

## 2016-07-27 LAB — TYPE AND SCREEN
ABO/RH(D): O POS
ANTIBODY SCREEN: NEGATIVE
UNIT DIVISION: 0
UNIT DIVISION: 0
Unit division: 0

## 2016-07-31 ENCOUNTER — Inpatient Hospital Stay: Payer: Medicare HMO | Attending: Hematology and Oncology | Admitting: Hematology and Oncology

## 2016-07-31 ENCOUNTER — Encounter: Payer: Self-pay | Admitting: Hematology and Oncology

## 2016-07-31 ENCOUNTER — Inpatient Hospital Stay: Payer: Medicare HMO

## 2016-07-31 VITALS — BP 105/67 | HR 112 | Temp 99.8°F | Resp 18 | Ht 66.0 in | Wt 298.1 lb

## 2016-07-31 DIAGNOSIS — R42 Dizziness and giddiness: Secondary | ICD-10-CM

## 2016-07-31 DIAGNOSIS — R0602 Shortness of breath: Secondary | ICD-10-CM

## 2016-07-31 DIAGNOSIS — Z7982 Long term (current) use of aspirin: Secondary | ICD-10-CM | POA: Insufficient documentation

## 2016-07-31 DIAGNOSIS — R5383 Other fatigue: Secondary | ICD-10-CM

## 2016-07-31 DIAGNOSIS — Z79899 Other long term (current) drug therapy: Secondary | ICD-10-CM

## 2016-07-31 DIAGNOSIS — G4730 Sleep apnea, unspecified: Secondary | ICD-10-CM | POA: Insufficient documentation

## 2016-07-31 DIAGNOSIS — J449 Chronic obstructive pulmonary disease, unspecified: Secondary | ICD-10-CM | POA: Insufficient documentation

## 2016-07-31 DIAGNOSIS — Z87891 Personal history of nicotine dependence: Secondary | ICD-10-CM | POA: Diagnosis not present

## 2016-07-31 DIAGNOSIS — F329 Major depressive disorder, single episode, unspecified: Secondary | ICD-10-CM | POA: Insufficient documentation

## 2016-07-31 DIAGNOSIS — I10 Essential (primary) hypertension: Secondary | ICD-10-CM | POA: Insufficient documentation

## 2016-07-31 DIAGNOSIS — D509 Iron deficiency anemia, unspecified: Secondary | ICD-10-CM | POA: Diagnosis not present

## 2016-07-31 DIAGNOSIS — D649 Anemia, unspecified: Secondary | ICD-10-CM

## 2016-07-31 DIAGNOSIS — M109 Gout, unspecified: Secondary | ICD-10-CM | POA: Diagnosis not present

## 2016-07-31 LAB — FERRITIN: Ferritin: 36 ng/mL (ref 11–307)

## 2016-07-31 LAB — FOLATE: Folate: 33 ng/mL (ref >5.9)

## 2016-07-31 LAB — CBC WITH DIFFERENTIAL/PLATELET
Basophils Absolute: 0.1 10*3/uL (ref 0–0.1)
Basophils Relative: 1 %
Eosinophils Absolute: 0 10*3/uL (ref 0–0.7)
Eosinophils Relative: 0 %
HCT: 29.9 % — ABNORMAL LOW (ref 35.0–47.0)
Hemoglobin: 9.2 g/dL — ABNORMAL LOW (ref 12.0–16.0)
Lymphocytes Relative: 23 %
Lymphs Abs: 1.6 10*3/uL (ref 1.0–3.6)
MCH: 20.3 pg — ABNORMAL LOW (ref 26.0–34.0)
MCHC: 30.8 g/dL — ABNORMAL LOW (ref 32.0–36.0)
MCV: 65.9 fL — ABNORMAL LOW (ref 80.0–100.0)
Monocytes Absolute: 0.8 10*3/uL (ref 0.2–0.9)
Monocytes Relative: 12 %
Neutro Abs: 4.2 10*3/uL (ref 1.4–6.5)
Neutrophils Relative %: 64 %
Platelets: 232 10*3/uL (ref 150–440)
RBC: 4.54 MIL/uL (ref 3.80–5.20)
RDW: 31.6 % — ABNORMAL HIGH (ref 11.5–14.5)
WBC: 6.6 10*3/uL (ref 3.6–11.0)

## 2016-07-31 LAB — RETICULOCYTES
RBC.: 4.54 MIL/uL (ref 3.80–5.20)
Retic Count, Absolute: 181.6 10*3/uL (ref 19.0–183.0)
Retic Ct Pct: 4 % — ABNORMAL HIGH (ref 0.4–3.1)

## 2016-07-31 LAB — BASIC METABOLIC PANEL
Anion gap: 12 (ref 5–15)
BUN: 19 mg/dL (ref 6–20)
CO2: 21 mmol/L — ABNORMAL LOW (ref 22–32)
Calcium: 9.6 mg/dL (ref 8.9–10.3)
Chloride: 106 mmol/L (ref 101–111)
Creatinine, Ser: 1.37 mg/dL — ABNORMAL HIGH (ref 0.44–1.00)
GFR calc Af Amer: 46 mL/min — ABNORMAL LOW (ref >60)
GFR calc non Af Amer: 40 mL/min — ABNORMAL LOW (ref >60)
Glucose, Bld: 101 mg/dL — ABNORMAL HIGH (ref 65–99)
Potassium: 4.1 mmol/L (ref 3.5–5.1)
Sodium: 139 mmol/L (ref 135–145)

## 2016-07-31 LAB — URINALYSIS COMPLETE WITH MICROSCOPIC (ARMC ONLY)
Bilirubin Urine: NEGATIVE
Glucose, UA: NEGATIVE mg/dL
Hgb urine dipstick: NEGATIVE
Ketones, ur: NEGATIVE mg/dL
Leukocytes, UA: NEGATIVE
Nitrite: NEGATIVE
Protein, ur: NEGATIVE mg/dL
RBC / HPF: NONE SEEN RBC/hpf (ref 0–5)
Specific Gravity, Urine: <1.005 — ABNORMAL LOW (ref 1.005–1.030)
pH: 5.5 (ref 5.0–8.0)

## 2016-07-31 LAB — VITAMIN B12: Vitamin B-12: 646 pg/mL (ref 180–914)

## 2016-07-31 NOTE — Progress Notes (Signed)
[facility] day:  07/31/2016  Chief Complaint: Carla Huerta is a 65 y.o. female with anemia who is referred in consultation by Dr. Verdell Carmine for assessment and management.  HPI: The patient notes blood drawn once a year. Counts have been "borderline" for awhile.  She notes being on "oral iron for a while then 2 months off".     She was admitted overnight at [facility] on 07/25/2016.  She presented with fatigue, dizziness, and shortness of breath.  Hematocrit was 17.3 with a hemoglobin of 5.1, and MCV 56.3.  She was transfused with 3 units of packed red blood cells.   Hemoglobin at discharge was 7.0.  Stool occult blood was negative.  She was told to take iron again.  She notes a weight loss of about 15 pounds during the summer. She is eating vegetables and pasta. She does not like to eat meat or chicken. She eats chicken livers once a week. She will also eat fish. She craves ice cream and has loved ice for years.  She denies any melena or hematochezia.   She has never had a colonoscopy or EGD.  She has a history of splenic laceration 4-5 years ago.  Spleen was not removed.  She was transfused 4 units of PRBCs.  Symptomatically, she notes a runny nose and cough since her hospitalization. She has some bladder leakage.   She has felt better since discharge and transfusion of PRBCs. She denies any dizziness or swimmy headedness. She denies any hematuria or vaginal bleeding.   Past Medical History:  Diagnosis Date  . Anemia   . COPD (chronic obstructive pulmonary disease) ([place])   . Cough   . Depression   . Diaphragm paralysis   . Gallstone   . Gout   . Hypertension   . Hyponatremia   . Insomnia   . Liver disease   . Renal disorder   . Sleep apnea   . Splenic laceration   . Vertigo     No past surgical history on file.  No family history on file.  Social History:  reports that she has quit smoking. She uses smokeless tobacco. She reports  that she does not drink alcohol. Her drug history is not on file.  She denies any exposure to radiation or toxins.  She lives in [place].  She is disabled.  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  The patient is alone today.  Allergies: No Known Allergies  Current Medications: Current Outpatient Prescriptions  Medication Sig Dispense Refill  . albuterol (PROVENTIL HFA;VENTOLIN HFA) 108 (90 Base) MCG/ACT inhaler Inhale 2 puffs into the lungs every 4 (four) hours as needed for wheezing or shortness of breath.    Marland Kitchen aspirin 81 MG chewable tablet Chew 81 mg by mouth at bedtime.    Marland Kitchen atorvastatin (LIPITOR) 40 MG tablet Take 40 mg by mouth at bedtime.    . cholecalciferol (VITAMIN D) 1000 units tablet Take 1,000 Units by mouth daily.    . colchicine 0.6 MG tablet Take 0.6 mg by mouth at bedtime.    . ferrous sulfate 325 (65 FE) MG EC tablet Take 1 tablet (325 mg total) by mouth 2 (two) times daily. 60 tablet 2  . furosemide (LASIX) 20 MG tablet Take 20 mg by mouth 3 (three) times a week. On Mondays, Wednesdays, and Fridays.    Marland Kitchen gabapentin (NEURONTIN) 300 MG capsule Take 300 mg by mouth 3 (three) times  daily.    . imipramine (TOFRANIL-PM) 100 MG capsule Take 100 mg by mouth at bedtime.    Marland Kitchen lisinopril (PRINIVIL,ZESTRIL) 10 MG tablet Take 10 mg by mouth daily.    Marland Kitchen omeprazole (PRILOSEC) 40 MG capsule Take 40 mg by mouth at bedtime.    . tolterodine (DETROL LA) 4 MG 24 hr capsule Take 4 mg by mouth at bedtime.     No current facility-administered medications for this visit.     Review of Systems:  GENERAL:  Feels better since transfusion.  No fevers or sweats.  Weight loss of 15 pounds during the summer. PERFORMANCE STATUS (ECOG):  2 HEENT:  Runny nose.  Dry mouth.  No visual changes, sore throat, mouth sores or tenderness. Lungs: Cough.  No shortness of breath.  No hemoptysis. Cardiac:  No chest pain, palpitations, orthopnea, or PND. GI:  No nausea, vomiting, diarrhea,  constipation, melena or hematochezia.  Ice pica. GU: Bladder leakage.  No urgency, frequency, dysuria, or hematuria. Musculoskeletal:  No back pain. Needs left knee replacement.  No muscle tenderness. Extremities:  No pain or swelling. Skin:  No rashes or skin changes. Neuro:  No headache, numbness or weakness, balance or coordination issues. Endocrine:  No diabetes, thyroid issues, hot flashes or night sweats. Psych:  No mood changes, depression or anxiety. Pain:  No focal pain. Review of systems:  All other systems reviewed and found to be negative.  Physical Exam: Blood pressure 105/67, pulse (!) 112, temperature 99.8 F (37.7 C), temperature source Tympanic, resp. rate 18, height '5\' 6"'  (1.676 m), weight 298 lb 1 oz (135.2 kg). GENERAL:  Well developed, well nourished, heavyset woman sitting comfortably in the exam room in no acute distress. MENTAL STATUS:  Alert and oriented to person, place and time. HEAD:  Long gray hair.  Normocephalic, atraumatic, face symmetric, no Cushingoid features. EYES:  Blue eyes.  Pupils equal round and reactive to light and accomodation.  No conjunctivitis or scleral icterus. ENT:  Oropharynx clear without lesion.  Upper dentures.  No lower teeth.  Tongue normal. Mucous membranes moist.  RESPIRATORY:  Clear to auscultation without rales, wheezes or rhonchi. CARDIOVASCULAR:  Regular rate and rhythm without murmur, rub or gallop. ABDOMEN:  Soft, non-tender, with active bowel sounds, and no appreciable hepatosplenomegaly.  No masses. SKIN:  No rashes, ulcers or lesions. EXTREMITIES: No edema, no skin discoloration or tenderness.  No palpable cords. LYMPH NODES: No palpable cervical, supraclavicular, axillary or inguinal adenopathy  NEUROLOGICAL: Unremarkable. PSYCH:  Appropriate.   Office Visit on 07/31/2016  Component Date Value Ref Range Status  . WBC 07/31/2016 6.6  3.6 - 11.0 K/uL Final  . RBC 07/31/2016 4.54  3.80 - 5.20 MIL/uL Final  . Hemoglobin  07/31/2016 9.2* 12.0 - 16.0 g/dL Final  . HCT 07/31/2016 29.9* 35.0 - 47.0 % Final  . MCV 07/31/2016 65.9* 80.0 - 100.0 fL Final  . MCH 07/31/2016 20.3* 26.0 - 34.0 pg Final  . MCHC 07/31/2016 30.8* 32.0 - 36.0 g/dL Final  . RDW 07/31/2016 31.6* 11.5 - 14.5 % Final  . Platelets 07/31/2016 232  150 - 440 K/uL Final  . Neutrophils Relative % 07/31/2016 64  % Final  . Neutro Abs 07/31/2016 4.2  1.4 - 6.5 K/uL Final  . Lymphocytes Relative 07/31/2016 23  % Final  . Lymphs Abs 07/31/2016 1.6  1.0 - 3.6 K/uL Final  . Monocytes Relative 07/31/2016 12  % Final  . Monocytes Absolute 07/31/2016 0.8  0.2 - 0.9 K/uL  Final  . Eosinophils Relative 07/31/2016 0  % Final  . Eosinophils Absolute 07/31/2016 0.0  0 - 0.7 K/uL Final  . Basophils Relative 07/31/2016 1  % Final  . Basophils Absolute 07/31/2016 0.1  0 - 0.1 K/uL Final  . Ferritin 07/31/2016 36  11 - 307 ng/mL Final  . Retic Ct Pct 07/31/2016 4.0* 0.4 - 3.1 % Final  . RBC. 07/31/2016 4.54  3.80 - 5.20 MIL/uL Final  . Retic Count, Manual 07/31/2016 181.6  19.0 - 183.0 K/uL Final  . Vitamin B-12 07/31/2016 646  180 - 914 pg/mL Final   Comment: (NOTE) This assay is not validated for testing neonatal or myeloproliferative syndrome specimens for Vitamin B12 levels. Performed at [facility]   . Folate 07/31/2016 33.0  >5.9 ng/mL Final  . Sodium 07/31/2016 139  135 - 145 mmol/L Final  . Potassium 07/31/2016 4.1  3.5 - 5.1 mmol/L Final  . Chloride 07/31/2016 106  101 - 111 mmol/L Final  . CO2 07/31/2016 21* 22 - 32 mmol/L Final  . Glucose, Bld 07/31/2016 101* 65 - 99 mg/dL Final  . BUN 07/31/2016 19  6 - 20 mg/dL Final  . Creatinine, Ser 07/31/2016 1.37* 0.44 - 1.00 mg/dL Final  . Calcium 07/31/2016 9.6  8.9 - 10.3 mg/dL Final  . GFR calc non Af Amer 07/31/2016 40* >60 mL/min Final  . GFR calc Af Amer 07/31/2016 46* >60 mL/min Final   Comment: (NOTE) The eGFR has been calculated using the CKD EPI equation. This calculation has not  been validated in all clinical situations. eGFR's persistently <60 mL/min signify possible Chronic Kidney Disease.   . Anion gap 07/31/2016 12  5 - 15 Final  . Color, Urine 07/31/2016 YELLOW  YELLOW Final  . APPearance 07/31/2016 CLEAR  CLEAR Final  . Glucose, UA 07/31/2016 NEGATIVE  NEGATIVE mg/dL Final  . Bilirubin Urine 07/31/2016 NEGATIVE  NEGATIVE Final  . Ketones, ur 07/31/2016 NEGATIVE  NEGATIVE mg/dL Final  . Specific Gravity, Urine 07/31/2016 <1.005* 1.005 - 1.030 Final  . Hgb urine dipstick 07/31/2016 NEGATIVE  NEGATIVE Final  . pH 07/31/2016 5.5  5.0 - 8.0 Final  . Protein, ur 07/31/2016 NEGATIVE  NEGATIVE mg/dL Final  . Nitrite 07/31/2016 NEGATIVE  NEGATIVE Final  . Leukocytes, UA 07/31/2016 NEGATIVE  NEGATIVE Final  . RBC / HPF 07/31/2016 NONE SEEN  0 - 5 RBC/hpf Final  . WBC, UA 07/31/2016 0-5  0 - 5 WBC/hpf Final  . Bacteria, UA 07/31/2016 MANY* NONE SEEN Final  . Squamous Epithelial / LPF 07/31/2016 0-5* NONE SEEN Final    Assessment:  Carla Huerta is a 65 y.o. female with severe microcytic anemia c/w iron deficiency.  She presented with fatigue, dizziness, and shortness of breath.  Hematocrit was 17.3 with a hemoglobin of 5.1, and MCV 56.3.  She was transfused with 3 units of PRBCs.   Hemoglobin at discharge was 7.0.  Stool occult blood was negative.  She is on oral iron.  Diet is modest.  She denies any melena, hematochezia, hematuria or vaginal bleeding.  She has ice pica.  She has never had a colonoscopy or EGD.  She has a history of splenic laceration 4-5 years ago.  Her spleen was not removed.  She was transfused 4 units of PRBCs.  Symptomatically, she feels better since her transfusion.  She has lost 15 pounds over the summer.  Plan: 1.  Review labs revealing probable severe iron deficiency.  We discussed additional labs today.  We discussed continuation of oral iron.  Iron will be increased to BID with OJ or vitamin C.  We discussed iron rich foods.  We  discussed need for GI evaluation. 2.  Labs today:  CBC with diff, BMP, ferritin, iron studies, B12, folate, retic count. 3.  Urinalysis: r/o hematuria. 4.  Increase ferrous sulfate to 325 mg BID with OJ or vitamin C. 5.  Discuss plan for IV iron if unable to replete iron stores. 6.  Preauth Venofer. 7.  Anticipate GI consult for EGD + colonoscopy. 8.  RTC in 2 weeks for MD assessment and labs (CBC with diff, ferritin) +/- Venofer.   Lequita Asal, MD  07/31/2016

## 2016-07-31 NOTE — Progress Notes (Signed)
Patient here today as new evaluation regarding severe IDA (Hgb 7.0).  Referred by Dr. Cherlynn KaiserSainani.

## 2016-08-02 ENCOUNTER — Other Ambulatory Visit: Payer: Self-pay | Admitting: *Deleted

## 2016-08-02 DIAGNOSIS — D649 Anemia, unspecified: Secondary | ICD-10-CM

## 2016-08-04 ENCOUNTER — Encounter: Payer: Self-pay | Admitting: Hematology and Oncology

## 2016-08-11 NOTE — Progress Notes (Signed)
[facility]  Clinic day:  08/12/2016   Chief Complaint: Carla Huerta is a 65 y.o. female with anemia who is seen for review of work-up and discussion regarding direction of therapy.  HPI: The patient was last seen in the hematology clinic on 07/31/2016.  At that time, she was seen for initial consultation.  She had severe microcytic anemia c/w iron deficiency.  She had presented with symptomatic anemia (hematocrit 17.3) and had received 3 units of PRBCs.  Diet was modest.  She was on oral iron.  She had never had a colonoscopy or EGD.  Concern was raised because of a 15 pound unexplained weight loss over the summer.  We discussed the need for GI evaluation.  She underwent a work-up.  CBC revealed a hematocrit of 29.9, hemoglobin 9.2, MCV 65.9, platelets 232,000 and white count 6600.  Ferritin was 36.  Reticulocyte count was 4%.  B12 was 646 and folate 33.  Creatinine was 1.37 (CrCl 40 ml/min).  Urinalysis revealed no blood.  At last visit, ferrous sulfate was increased to 325 mg po twice a day with orange juice or vitamin C.  If she did not respond appropriately to oral iron, recommendations were for IV iron.  During the interim, she notes brief left sided epistaxis.  She notes clavicle pain.  She is taking her oral iron.  She has an appointment with GI.   Past Medical History:  Diagnosis Date  . Anemia   . COPD (chronic obstructive pulmonary disease) ([facility])   . Cough   . Depression   . Diaphragm paralysis   . Gallstone   . Gout   . Hypertension   . Hyponatremia   . Insomnia   . Liver disease   . Renal disorder   . Sleep apnea   . Splenic laceration   . Vertigo     History reviewed. No pertinent surgical history.  History reviewed. No pertinent family history.  Social History:  reports that she has quit smoking. She uses smokeless tobacco. She reports that she does not drink alcohol. Her drug history is not on file.  She denies any exposure  to radiation or toxins.  She lives in [place].  She is disabled.  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  The patient is accompanied by her husband today.  Allergies: No Known Allergies  Current Medications: Current Outpatient Prescriptions  Medication Sig Dispense Refill  . albuterol (PROVENTIL HFA;VENTOLIN HFA) 108 (90 Base) MCG/ACT inhaler Inhale 2 puffs into the lungs every 4 (four) hours as needed for wheezing or shortness of breath.    Marland Kitchen. aspirin 81 MG chewable tablet Chew 81 mg by mouth at bedtime.    Marland Kitchen. atorvastatin (LIPITOR) 40 MG tablet Take 40 mg by mouth at bedtime.    . cholecalciferol (VITAMIN D) 1000 units tablet Take 1,000 Units by mouth daily.    . colchicine 0.6 MG tablet Take 0.6 mg by mouth at bedtime.    . ferrous sulfate 325 (65 FE) MG EC tablet Take 1 tablet (325 mg total) by mouth 2 (two) times daily. 60 tablet 2  . furosemide (LASIX) 20 MG tablet Take 20 mg by mouth 3 (three) times a week. On Mondays, Wednesdays, and Fridays.    Marland Kitchen. gabapentin (NEURONTIN) 300 MG capsule Take 300 mg by mouth 3 (three) times daily.    Marland Kitchen. imipramine (TOFRANIL-PM) 100 MG capsule Take 100 mg by mouth at bedtime.    .Marland Kitchen  lisinopril (PRINIVIL,ZESTRIL) 10 MG tablet Take 10 mg by mouth daily.    Marland Kitchen omeprazole (PRILOSEC) 40 MG capsule Take 40 mg by mouth at bedtime.    . tolterodine (DETROL LA) 4 MG 24 hr capsule Take 4 mg by mouth at bedtime.     No current facility-administered medications for this visit.     Review of Systems:  GENERAL:  Feels "ok".  No fevers or sweats.  Weight up 3 pounds. PERFORMANCE STATUS (ECOG):  2 HEENT:  No visual changes, sore throat, mouth sores or tenderness. Lungs: Shortness of breath with exertion.  No cough.  No hemoptysis. Cardiac:  No chest pain, palpitations, orthopnea, or PND. GI:  No nausea, vomiting, diarrhea, constipation, melena or hematochezia.  Ice pica. GU: Bladder leakage.  No urgency, frequency, dysuria, or  hematuria. Musculoskeletal:  No back pain. Needs left knee replacement.  Clavicle pain.  No muscle tenderness. Extremities:  No pain or swelling. Skin:  No rashes or skin changes. Neuro:  No headache, numbness or weakness, balance or coordination issues. Endocrine:  No diabetes, thyroid issues, hot flashes or night sweats. Psych:  No mood changes, depression or anxiety. Pain:  No focal pain. Review of systems:  All other systems reviewed and found to be negative.  Physical Exam: Blood pressure 103/68, pulse (!) 116, weight (!) 301 lb 2.4 oz (136.6 kg). GENERAL:  Well developed, well nourished, heavyset woman sitting comfortably in the exam room in no acute distress. MENTAL STATUS:  Alert and oriented to person, place and time. HEAD:  Long gray hair.  Normocephalic, atraumatic, face symmetric, no Cushingoid features. EYES:  Blue eyes.  No conjunctivitis or scleral icterus. NEUROLOGICAL: Unremarkable. PSYCH:  Appropriate.   Appointment on 08/12/2016  Component Date Value Ref Range Status  . WBC 08/12/2016 5.0  3.6 - 11.0 K/uL Final  . RBC 08/12/2016 4.17  3.80 - 5.20 MIL/uL Final  . Hemoglobin 08/12/2016 10.1* 12.0 - 16.0 g/dL Final  . HCT 16/08/9603 31.0* 35.0 - 47.0 % Final  . MCV 08/12/2016 74.3* 80.0 - 100.0 fL Final  . MCH 08/12/2016 24.2* 26.0 - 34.0 pg Final  . MCHC 08/12/2016 32.6  32.0 - 36.0 g/dL Final  . RDW 54/07/8118 40.4* 11.5 - 15.5 % Final  . Platelets 08/12/2016 263  150 - 440 K/uL Final  . Neutrophils Relative % 08/12/2016 65  % Final  . Neutro Abs 08/12/2016 3.3  1.4 - 6.5 K/uL Final  . Lymphocytes Relative 08/12/2016 21  % Final  . Lymphs Abs 08/12/2016 1.0  1.0 - 3.6 K/uL Final  . Monocytes Relative 08/12/2016 13  % Final  . Monocytes Absolute 08/12/2016 0.7  0.2 - 0.9 K/uL Final  . Eosinophils Relative 08/12/2016 0  % Final  . Eosinophils Absolute 08/12/2016 0.0  0 - 0.7 K/uL Final  . Basophils Relative 08/12/2016 1  % Final  . Basophils Absolute 08/12/2016  0.0  0 - 0.1 K/uL Final    Assessment:  Carla Huerta is a 65 y.o. female with severe iron deficiency.  She presented with fatigue, dizziness, and shortness of breath.  Hematocrit was 17.3 with a hemoglobin of 5.1, and MCV 56.3.  She was transfused with 3 units of PRBCs.   Hemoglobin at discharge was 7.0.  Stool occult blood was negative.  She is on oral iron.  Work-up on 07/31/2016 revealed a hematocrit of 29.9, hemoglobin 9.2, MCV 65.9, platelets 232,000 and white count 6600.  Ferritin was 36.  Reticulocyte count was 4%.  B12 and folate were normal.  Creatinine was 1.37 (CrCl 40 ml/min).  Urinalysis revealed no blood.  Diet is modest.  She denies any melena, hematochezia, hematuria or vaginal bleeding.  She has ice pica.  She has never had a colonoscopy or EGD.  She has a history of splenic laceration 4-5 years ago.  Her spleen was not removed.  She was transfused 4 units of PRBCs.  Symptomatically, she feels better since her transfusion.  Exam is stable.  Hematocrit has improved to 31.0.   MCV is 74.3.  Plan: 1.  Review work-up and isolated iron deficiency.  Discuss improvement in hematocrit on oral iron.  Discuss plan to continue oral iron until ferritin 100.  Discuss gastroenterology consult to r/o GI blood loss. 2.  Continue ferrous sulfate to 325 mg BID with OJ or vitamin C. 3.  No Venofer today 4.  GI consult on 10/01/2016. 5.  RTC in 2 months for MD assessment and labs (CBC with diff, ferritin).   Rosey Bath, MD  08/12/2016, 9:05 AM

## 2016-08-12 ENCOUNTER — Inpatient Hospital Stay: Payer: Medicare HMO

## 2016-08-12 ENCOUNTER — Encounter: Payer: Self-pay | Admitting: Hematology and Oncology

## 2016-08-12 ENCOUNTER — Other Ambulatory Visit: Payer: Self-pay | Admitting: *Deleted

## 2016-08-12 ENCOUNTER — Telehealth: Payer: Self-pay

## 2016-08-12 ENCOUNTER — Inpatient Hospital Stay (HOSPITAL_BASED_OUTPATIENT_CLINIC_OR_DEPARTMENT_OTHER): Payer: Medicare HMO | Admitting: Hematology and Oncology

## 2016-08-12 ENCOUNTER — Inpatient Hospital Stay: Payer: Medicare HMO | Attending: Hematology and Oncology

## 2016-08-12 ENCOUNTER — Other Ambulatory Visit: Payer: Self-pay

## 2016-08-12 VITALS — BP 103/68 | HR 116 | Wt 301.1 lb

## 2016-08-12 DIAGNOSIS — D509 Iron deficiency anemia, unspecified: Secondary | ICD-10-CM

## 2016-08-12 DIAGNOSIS — Z7982 Long term (current) use of aspirin: Secondary | ICD-10-CM | POA: Insufficient documentation

## 2016-08-12 DIAGNOSIS — Z79899 Other long term (current) drug therapy: Secondary | ICD-10-CM | POA: Diagnosis not present

## 2016-08-12 DIAGNOSIS — J449 Chronic obstructive pulmonary disease, unspecified: Secondary | ICD-10-CM | POA: Insufficient documentation

## 2016-08-12 DIAGNOSIS — F329 Major depressive disorder, single episode, unspecified: Secondary | ICD-10-CM | POA: Insufficient documentation

## 2016-08-12 DIAGNOSIS — G4730 Sleep apnea, unspecified: Secondary | ICD-10-CM | POA: Diagnosis not present

## 2016-08-12 DIAGNOSIS — Z720 Tobacco use: Secondary | ICD-10-CM | POA: Diagnosis not present

## 2016-08-12 DIAGNOSIS — M109 Gout, unspecified: Secondary | ICD-10-CM | POA: Insufficient documentation

## 2016-08-12 DIAGNOSIS — G4700 Insomnia, unspecified: Secondary | ICD-10-CM | POA: Diagnosis not present

## 2016-08-12 DIAGNOSIS — I10 Essential (primary) hypertension: Secondary | ICD-10-CM | POA: Insufficient documentation

## 2016-08-12 DIAGNOSIS — D649 Anemia, unspecified: Secondary | ICD-10-CM

## 2016-08-12 LAB — CBC WITH DIFFERENTIAL/PLATELET
Basophils Absolute: 0 10*3/uL (ref 0–0.1)
Basophils Relative: 1 %
Eosinophils Absolute: 0 10*3/uL (ref 0–0.7)
Eosinophils Relative: 0 %
HCT: 31 % — ABNORMAL LOW (ref 35.0–47.0)
Hemoglobin: 10.1 g/dL — ABNORMAL LOW (ref 12.0–16.0)
Lymphocytes Relative: 21 %
Lymphs Abs: 1 10*3/uL (ref 1.0–3.6)
MCH: 24.2 pg — ABNORMAL LOW (ref 26.0–34.0)
MCHC: 32.6 g/dL (ref 32.0–36.0)
MCV: 74.3 fL — ABNORMAL LOW (ref 80.0–100.0)
Monocytes Absolute: 0.7 10*3/uL (ref 0.2–0.9)
Monocytes Relative: 13 %
Neutro Abs: 3.3 10*3/uL (ref 1.4–6.5)
Neutrophils Relative %: 65 %
Platelets: 263 10*3/uL (ref 150–440)
RBC: 4.17 MIL/uL (ref 3.80–5.20)
RDW: 40.4 % — ABNORMAL HIGH (ref 11.5–15.5)
WBC: 5 10*3/uL (ref 3.6–11.0)

## 2016-08-12 LAB — FERRITIN: Ferritin: 36 ng/mL (ref 11–307)

## 2016-08-12 NOTE — Telephone Encounter (Signed)
Gastroenterology Pre-Procedure Review  Request Date: 10/01/2016 Requesting Physician: Dr. Merlene PullingORCORAN  PATIENT REVIEW QUESTIONS: The patient responded to the following health history questions as indicated:    1. Are you having any GI issues? no 2. Do you have a personal history of Polyps? no 3. Do you have a family history of Colon Cancer or Polyps? no 4. Diabetes Mellitus? no 5. Joint replacements in the past 12 months?no 6. Major health problems in the past 3 months?yes (Hemoglobin was low, given 3 units of blood 07/2016) 7. Any artificial heart valves, MVP, or defibrillator?no    MEDICATIONS & ALLERGIES:    Patient reports the following regarding taking any anticoagulation/antiplatelet therapy:   Plavix, Coumadin, Eliquis, Xarelto, Lovenox, Pradaxa, Brilinta, or Effient? no Aspirin? yes (Heart Health)  Patient confirms/reports the following medications:  Current Outpatient Prescriptions  Medication Sig Dispense Refill  . albuterol (PROVENTIL HFA;VENTOLIN HFA) 108 (90 Base) MCG/ACT inhaler Inhale 2 puffs into the lungs every 4 (four) hours as needed for wheezing or shortness of breath.    Marland Kitchen. aspirin 81 MG chewable tablet Chew 81 mg by mouth at bedtime.    Marland Kitchen. atorvastatin (LIPITOR) 40 MG tablet Take 40 mg by mouth at bedtime.    . cholecalciferol (VITAMIN D) 1000 units tablet Take 1,000 Units by mouth daily.    . colchicine 0.6 MG tablet Take 0.6 mg by mouth at bedtime.    . ferrous sulfate 325 (65 FE) MG EC tablet Take 1 tablet (325 mg total) by mouth 2 (two) times daily. 60 tablet 2  . furosemide (LASIX) 20 MG tablet Take 20 mg by mouth 3 (three) times a week. On Mondays, Wednesdays, and Fridays.    Marland Kitchen. gabapentin (NEURONTIN) 300 MG capsule Take 300 mg by mouth 3 (three) times daily.    Marland Kitchen. lisinopril (PRINIVIL,ZESTRIL) 10 MG tablet Take 10 mg by mouth daily.    Marland Kitchen. omeprazole (PRILOSEC) 40 MG capsule Take 40 mg by mouth at bedtime.    . tolterodine (DETROL LA) 4 MG 24 hr capsule Take 4 mg  by mouth at bedtime.    Marland Kitchen. imipramine (TOFRANIL) 50 MG tablet      No current facility-administered medications for this visit.     Patient confirms/reports the following allergies:  No Known Allergies  No orders of the defined types were placed in this encounter.   AUTHORIZATION INFORMATION Primary Insurance: 1D#: Group #:  Secondary Insurance: 1D#: Group #:  SCHEDULE INFORMATION: Date: 10/01/2016 Time: Location: [facility]

## 2016-08-12 NOTE — Progress Notes (Signed)
Called Dr. Servando SnareWohl office and pt has been scheduled for colonoscopy for 11/21 and the office notified pt about the date and time.

## 2016-08-12 NOTE — Progress Notes (Signed)
Patient states she had a nosebleed last week. Has not had another one.  Otherwise, no complaints.

## 2016-08-12 NOTE — Telephone Encounter (Signed)
Screening Colonoscopy Z12.11 Iron deficiency anemia, unspecified iron deficiency anemia type D50.9 [facility] 10/01/2016 [employer]are  Pre cert

## 2016-08-26 NOTE — Telephone Encounter (Signed)
Authorization is not required per Elle T. At [phone] Reference #: [id number]

## 2016-09-24 ENCOUNTER — Telehealth: Payer: Self-pay | Admitting: Gastroenterology

## 2016-09-24 NOTE — Telephone Encounter (Signed)
Patient wants to know if she should stop her iron supplement 5 days prior to her procedure?

## 2016-09-24 NOTE — Telephone Encounter (Signed)
Pt advised to stop iron supplements, fish oil and multivitamins 5 days prior to procedure.

## 2016-10-01 ENCOUNTER — Ambulatory Visit
Admission: RE | Admit: 2016-10-01 | Discharge: 2016-10-01 | Disposition: A | Discharge status: Home / Self Care | Payer: Medicare HMO | Source: Ambulatory Visit | Attending: Gastroenterology | Admitting: Gastroenterology

## 2016-10-01 ENCOUNTER — Encounter: Payer: Self-pay | Admitting: Anesthesiology

## 2016-10-01 ENCOUNTER — Ambulatory Visit: Payer: Medicare HMO | Admitting: Anesthesiology

## 2016-10-01 ENCOUNTER — Encounter
Admission: RE | Disposition: A | Discharge status: Home / Self Care | Payer: Self-pay | Source: Ambulatory Visit | Attending: Gastroenterology

## 2016-10-01 DIAGNOSIS — D500 Iron deficiency anemia secondary to blood loss (chronic): Secondary | ICD-10-CM | POA: Insufficient documentation

## 2016-10-01 DIAGNOSIS — G4730 Sleep apnea, unspecified: Secondary | ICD-10-CM | POA: Diagnosis not present

## 2016-10-01 DIAGNOSIS — N289 Disorder of kidney and ureter, unspecified: Secondary | ICD-10-CM | POA: Insufficient documentation

## 2016-10-01 DIAGNOSIS — F329 Major depressive disorder, single episode, unspecified: Secondary | ICD-10-CM | POA: Diagnosis not present

## 2016-10-01 DIAGNOSIS — Z79899 Other long term (current) drug therapy: Secondary | ICD-10-CM | POA: Insufficient documentation

## 2016-10-01 DIAGNOSIS — Z7982 Long term (current) use of aspirin: Secondary | ICD-10-CM | POA: Diagnosis not present

## 2016-10-01 DIAGNOSIS — M109 Gout, unspecified: Secondary | ICD-10-CM | POA: Diagnosis not present

## 2016-10-01 DIAGNOSIS — K769 Liver disease, unspecified: Secondary | ICD-10-CM | POA: Insufficient documentation

## 2016-10-01 DIAGNOSIS — J986 Disorders of diaphragm: Secondary | ICD-10-CM | POA: Insufficient documentation

## 2016-10-01 DIAGNOSIS — E871 Hypo-osmolality and hyponatremia: Secondary | ICD-10-CM | POA: Diagnosis not present

## 2016-10-01 DIAGNOSIS — I10 Essential (primary) hypertension: Secondary | ICD-10-CM | POA: Insufficient documentation

## 2016-10-01 DIAGNOSIS — Z87891 Personal history of nicotine dependence: Secondary | ICD-10-CM | POA: Insufficient documentation

## 2016-10-01 DIAGNOSIS — J449 Chronic obstructive pulmonary disease, unspecified: Secondary | ICD-10-CM | POA: Insufficient documentation

## 2016-10-01 DIAGNOSIS — K449 Diaphragmatic hernia without obstruction or gangrene: Secondary | ICD-10-CM | POA: Insufficient documentation

## 2016-10-01 DIAGNOSIS — G4700 Insomnia, unspecified: Secondary | ICD-10-CM | POA: Insufficient documentation

## 2016-10-01 DIAGNOSIS — D509 Iron deficiency anemia, unspecified: Secondary | ICD-10-CM | POA: Diagnosis present

## 2016-10-01 HISTORY — PX: COLONOSCOPY WITH PROPOFOL: SHX5780

## 2016-10-01 HISTORY — PX: ESOPHAGOGASTRODUODENOSCOPY (EGD) WITH PROPOFOL: SHX5813

## 2016-10-01 SURGERY — COLONOSCOPY WITH PROPOFOL
Anesthesia: General

## 2016-10-01 MED ORDER — MIDAZOLAM HCL 2 MG/2ML IJ SOLN
INTRAMUSCULAR | Status: DC | PRN
  Administered 2016-10-01: 1 mg via INTRAVENOUS

## 2016-10-01 MED ORDER — PROPOFOL 10 MG/ML IV BOLUS
INTRAVENOUS | Status: DC | PRN
  Administered 2016-10-01: 20 mg via INTRAVENOUS
  Administered 2016-10-01: 50 mg via INTRAVENOUS

## 2016-10-01 MED ORDER — SODIUM CHLORIDE 0.9 % IV SOLN
INTRAVENOUS | Status: DC
  Administered 2016-10-01: 1000 mL via INTRAVENOUS

## 2016-10-01 MED ORDER — PHENYLEPHRINE HCL 10 MG/ML IJ SOLN
INTRAMUSCULAR | Status: DC | PRN
  Administered 2016-10-01 (×3): 100 ug via INTRAVENOUS

## 2016-10-01 MED ORDER — PROPOFOL 500 MG/50ML IV EMUL
INTRAVENOUS | Status: DC | PRN
  Administered 2016-10-01: 125 ug/kg/min via INTRAVENOUS

## 2016-10-01 NOTE — Transfer of Care (Signed)
Immediate Anesthesia Transfer of Care Note  Patient: Carla Huerta  Procedure(s) Performed: Procedure(s): COLONOSCOPY WITH PROPOFOL (N/A) ESOPHAGOGASTRODUODENOSCOPY (EGD) WITH PROPOFOL (N/A)  Patient Location: PACU  Anesthesia Type:General  Level of Consciousness: awake and alert   Airway & Oxygen Therapy: Patient Spontanous Breathing and Patient connected to nasal cannula oxygen  Post-op Assessment: Report given to RN and Post -op Vital signs reviewed and stable  Post vital signs: Reviewed and stable  Last Vitals:  Vitals:   10/01/16 0904 10/01/16 1007  BP: (!) 145/88 (!) 94/54  Pulse: (!) 114 100  Resp: 16 14  Temp: 36.6 C 36.4 C    Last Pain:  Vitals:   10/01/16 1007  TempSrc: Axillary         Complications: No apparent anesthesia complications

## 2016-10-01 NOTE — H&P (Signed)
Carla Miniumarren Guinn Delarosa, MD [facility] [address] [place], [place] [address] Phone: [phone] Fax : [phone]  Primary Care Physician:  [facility] Gastroenterologist:  Dr. Servando SnareWohl  Pre-Procedure History & Physical: HPI:  Carla Huerta is a 65 y.o. female is here for an endoscopy and colonoscopy.   Past Medical History:  Diagnosis Date  . Anemia   . COPD (chronic obstructive pulmonary disease) (HCC)   . Cough   . Depression   . Diaphragm paralysis   . Gallstone   . Gout   . Hypertension   . Hyponatremia   . Insomnia   . Liver disease   . Renal disorder   . Sleep apnea   . Splenic laceration   . Vertigo     No past surgical history on file.  Prior to Admission medications   Medication Sig Start Date End Date Taking? Authorizing Provider  albuterol (PROVENTIL HFA;VENTOLIN HFA) 108 (90 Base) MCG/ACT inhaler Inhale 2 puffs into the lungs every 4 (four) hours as needed for wheezing or shortness of breath.   Yes Historical Provider, MD  aspirin 81 MG chewable tablet Chew 81 mg by mouth at bedtime.   Yes Historical Provider, MD  atorvastatin (LIPITOR) 40 MG tablet Take 40 mg by mouth at bedtime.   Yes Historical Provider, MD  cholecalciferol (VITAMIN D) 1000 units tablet Take 1,000 Units by mouth daily.   Yes Historical Provider, MD  colchicine 0.6 MG tablet Take 0.6 mg by mouth at bedtime.   Yes Historical Provider, MD  ferrous sulfate 325 (65 FE) MG EC tablet Take 1 tablet (325 mg total) by mouth 2 (two) times daily. 07/26/16  Yes Houston SirenVivek J Sainani, MD  furosemide (LASIX) 20 MG tablet Take 20 mg by mouth 3 (three) times a week. On Mondays, Wednesdays, and Fridays.   Yes Historical Provider, MD  gabapentin (NEURONTIN) 300 MG capsule Take 300 mg by mouth 3 (three) times daily.   Yes Historical Provider, MD  imipramine (TOFRANIL) 50 MG tablet  08/09/16  Yes Historical Provider, MD  lisinopril (PRINIVIL,ZESTRIL) 10 MG tablet Take 10 mg by mouth daily.   Yes  Historical Provider, MD  omeprazole (PRILOSEC) 40 MG capsule Take 40 mg by mouth at bedtime.   Yes Historical Provider, MD  tolterodine (DETROL LA) 4 MG 24 hr capsule Take 4 mg by mouth at bedtime.   Yes Historical Provider, MD    Allergies as of 08/12/2016  . (No Known Allergies)    No family history on file.  Social History   Social History  . Marital status: Married    Spouse name: N/A  . Number of children: N/A  . Years of education: N/A   Occupational History  . Not on file.   Social History Main Topics  . Smoking status: Former Games developermoker  . Smokeless tobacco: Current User  . Alcohol use No  . Drug use: Unknown  . Sexual activity: Not on file   Other Topics Concern  . Not on file   Social History Narrative  . No narrative on file    Review of Systems: See HPI, otherwise negative ROS  Physical Exam: BP (!) 145/88   Pulse (!) 114   Temp 97.9 F (36.6 C)   Resp 16   Ht 5\' 6"  (1.676 m)   Wt 300 lb (136.1 kg)   SpO2 98%   BMI 48.42 kg/m  General:   Alert,  pleasant and cooperative in NAD Head:  Normocephalic and atraumatic.  Neck:  Supple; no masses or thyromegaly. Lungs:  Clear throughout to auscultation.    Heart:  Regular rate and rhythm. Abdomen:  Soft, nontender and nondistended. Normal bowel sounds, without guarding, and without rebound.   Neurologic:  Alert and  oriented x4;  grossly normal neurologically.  Impression/Plan: Carla Huerta is here for an endoscopy and colonoscopy to be performed for IDA  Risks, benefits, limitations, and alternatives regarding  endoscopy and colonoscopy have been reviewed with the patient.  Questions have been answered.  All parties agreeable.   Carla Miniumarren Kyrene Longan, MD  10/01/2016, 9:07 AM

## 2016-10-01 NOTE — Anesthesia Preprocedure Evaluation (Signed)
Anesthesia Evaluation  Patient identified by MRN, date of birth, ID band Patient awake    Reviewed: Allergy & Precautions, NPO status , Patient's Chart, lab work & pertinent test results  Airway Mallampati: II       Dental  (+) Chipped   Pulmonary sleep apnea and Continuous Positive Airway Pressure Ventilation , COPD,  COPD inhaler, former smoker,    Pulmonary exam normal        Cardiovascular hypertension, Pt. on medications Normal cardiovascular exam     Neuro/Psych PSYCHIATRIC DISORDERS Depression    GI/Hepatic   Endo/Other    Renal/GU Renal disease     Musculoskeletal   Abdominal Normal abdominal exam  (+)   Peds  Hematology  (+) anemia ,   Anesthesia Other Findings Past Medical History: No date: Anemia No date: COPD (chronic obstructive pulmonary disease) (* No date: Cough No date: Depression No date: Diaphragm paralysis No date: Gallstone No date: Gout No date: Hypertension No date: Hyponatremia No date: Insomnia No date: Liver disease No date: Renal disorder No date: Sleep apnea No date: Splenic laceration No date: Vertigo  Reproductive/Obstetrics                             Anesthesia Physical Anesthesia Plan  ASA: III  Anesthesia Plan: General   Post-op Pain Management:    Induction: Intravenous  Airway Management Planned: Nasal Cannula  Additional Equipment:   Intra-op Plan:   Post-operative Plan:   Informed Consent: I have reviewed the patients History and Physical, chart, labs and discussed the procedure including the risks, benefits and alternatives for the proposed anesthesia with the patient or authorized representative who has indicated his/her understanding and acceptance.   Dental advisory given  Plan Discussed with: CRNA and Surgeon  Anesthesia Plan Comments:         Anesthesia Quick Evaluation

## 2016-10-01 NOTE — Op Note (Signed)
[facility] Gastroenterology Patient Name: Carla Huerta Procedure Date: 10/01/2016 9:41 AM MRN: [id number] Account #: [id number] Date of Birth: 1951/11/03 Admit Type: Outpatient Age: 65 [facility] Gender: Female Note Status: Finalized Procedure:            Upper GI endoscopy Indications:          Iron deficiency anemia Providers:            Midge Miniumarren Taila Basinski MD, MD Referring MD:         No Local Md, MD (Referring MD) Medicines:            Propofol per Anesthesia Complications:        No immediate complications. Procedure:            Pre-Anesthesia Assessment:                       - Prior to the procedure, a History and Physical was                        performed, and patient medications and allergies were                        reviewed. The patient's tolerance of previous                        anesthesia was also reviewed. The risks and benefits of                        the procedure and the sedation options and risks were                        discussed with the patient. All questions were                        answered, and informed consent was obtained. Prior                        Anticoagulants: The patient has taken no previous                        anticoagulant or antiplatelet agents. ASA Grade                        Assessment: III - A patient with severe systemic                        disease. After reviewing the risks and benefits, the                        patient was deemed in satisfactory condition to undergo                        the procedure.                       After obtaining informed consent, the endoscope was                        passed under direct vision. Throughout the procedure,  the patient's blood pressure, pulse, and oxygen                        saturations were monitored continuously. The Endoscope                        was introduced through the mouth, and advanced to the            second part of duodenum. The upper GI endoscopy was                        accomplished without difficulty. The patient tolerated                        the procedure well. Findings:      A medium-sized hiatal hernia was present.      The stomach was normal.      The examined duodenum was normal. Impression:           - Medium-sized hiatal hernia.                       - Normal stomach.                       - Normal examined duodenum.                       - No specimens collected. Recommendation:       - Discharge patient to home.                       - Resume previous diet.                       - Continue present medications.                       - Perform a colonoscopy today. Procedure Code(s):    --- Professional ---                       [id number], Esophagogastroduodenoscopy, flexible, transoral;                        diagnostic, including collection of specimen(s) by                        brushing or washing, when performed (separate procedure) Diagnosis Code(s):    --- Professional ---                       D50.9, Iron deficiency anemia, unspecified                       K44.9, Diaphragmatic hernia without obstruction or                        gangrene CPT copyright 2016 American Medical Association. All rights reserved. The codes documented in this report are preliminary and upon coder review may  be revised to meet current compliance requirements. Midge Miniumarren Nishawn Rotan MD, MD 10/01/2016 9:49:16 AM This report has been signed electronically. Number of Addenda: 0 Note Initiated On: 10/01/2016 9:41 AM      [facility]

## 2016-10-01 NOTE — Op Note (Signed)
[facility] Gastroenterology Patient Name: Carla Huerta Procedure Date: 10/01/2016 9:40 AM MRN: [id number] Account #: [id number] Date of Birth: 11-Jul-1951 Admit Type: Outpatient Age: 65 [facility] Gender: Female Note Status: Finalized Procedure:            Colonoscopy Indications:          Iron deficiency anemia secondary to chronic blood loss Providers:            Midge Miniumarren Clarinda Obi MD, MD Referring MD:         No Local Md, MD (Referring MD) Medicines:            Propofol per Anesthesia Complications:        No immediate complications. Procedure:            Pre-Anesthesia Assessment:                       - Prior to the procedure, a History and Physical was                        performed, and patient medications and allergies were                        reviewed. The patient's tolerance of previous                        anesthesia was also reviewed. The risks and benefits of                        the procedure and the sedation options and risks were                        discussed with the patient. All questions were                        answered, and informed consent was obtained. Prior                        Anticoagulants: The patient has taken no previous                        anticoagulant or antiplatelet agents. ASA Grade                        Assessment: II - A patient with mild systemic disease.                        After reviewing the risks and benefits, the patient was                        deemed in satisfactory condition to undergo the                        procedure.                       After obtaining informed consent, the colonoscope was                        passed under direct vision. Throughout the procedure,  the patient's blood pressure, pulse, and oxygen                        saturations were monitored continuously. The                        Colonoscope was introduced through the anus and                    advanced to the the cecum, identified by appendiceal                        orifice and ileocecal valve. The colonoscopy was                        performed without difficulty. The patient tolerated the                        procedure well. The quality of the bowel preparation                        was excellent. Findings:      The perianal and digital rectal examinations were normal.      The colon (entire examined portion) appeared normal. Impression:           - The entire examined colon is normal.                       - No specimens collected. Recommendation:       - Discharge patient to home.                       - Resume previous diet.                       - Continue present medications.                       - If anemia persists and consider capsule endoscopy. Procedure Code(s):    --- Professional ---                       [id number], Colonoscopy, flexible; diagnostic, including                        collection of specimen(s) by brushing or washing, when                        performed (separate procedure) Diagnosis Code(s):    --- Professional ---                       D50.0, Iron deficiency anemia secondary to blood loss                        (chronic) CPT copyright 2016 American Medical Association. All rights reserved. The codes documented in this report are preliminary and upon coder review may  be revised to meet current compliance requirements. Midge Miniumarren Susie Pousson MD, MD 10/01/2016 10:08:32 AM This report has been signed electronically. Number of Addenda: 0 Note Initiated On: 10/01/2016 9:40 AM Scope Withdrawal Time: 0 hours 11 minutes 18 seconds  Total Procedure Duration: 0 hours 13 minutes 29 seconds  [facility]

## 2016-10-02 ENCOUNTER — Encounter: Payer: Self-pay | Admitting: Gastroenterology

## 2016-10-02 NOTE — Anesthesia Postprocedure Evaluation (Signed)
Anesthesia Post Note  Patient: [name]  Procedure(s) Performed: Procedure(s) (LRB): COLONOSCOPY WITH PROPOFOL (N/A) ESOPHAGOGASTRODUODENOSCOPY (EGD) WITH PROPOFOL (N/A)  Patient location during evaluation: PACU Anesthesia Type: General Level of consciousness: awake and alert and oriented Pain management: pain level controlled Vital Signs Assessment: post-procedure vital signs reviewed and stable Respiratory status: spontaneous breathing Cardiovascular status: blood pressure returned to baseline Anesthetic complications: no    Last Vitals:  Vitals:   10/01/16 1027 10/01/16 1037  BP: (!) 105/57 112/62  Pulse: 96 92  Resp: 16 17  Temp:      Last Pain:  Vitals:   10/02/16 0752  TempSrc:   PainSc: 0-No pain                 Jadelyn Elks

## 2016-10-14 ENCOUNTER — Inpatient Hospital Stay (HOSPITAL_BASED_OUTPATIENT_CLINIC_OR_DEPARTMENT_OTHER): Payer: Medicare HMO | Admitting: Hematology and Oncology

## 2016-10-14 ENCOUNTER — Inpatient Hospital Stay: Payer: Medicare HMO | Attending: Hematology and Oncology

## 2016-10-14 ENCOUNTER — Encounter: Payer: Self-pay | Admitting: Hematology and Oncology

## 2016-10-14 ENCOUNTER — Other Ambulatory Visit: Payer: Self-pay

## 2016-10-14 VITALS — BP 112/68 | HR 105 | Temp 98.8°F | Resp 22 | Ht 66.0 in | Wt 308.0 lb

## 2016-10-14 DIAGNOSIS — I10 Essential (primary) hypertension: Secondary | ICD-10-CM | POA: Insufficient documentation

## 2016-10-14 DIAGNOSIS — Z79899 Other long term (current) drug therapy: Secondary | ICD-10-CM | POA: Diagnosis not present

## 2016-10-14 DIAGNOSIS — R42 Dizziness and giddiness: Secondary | ICD-10-CM | POA: Diagnosis not present

## 2016-10-14 DIAGNOSIS — D509 Iron deficiency anemia, unspecified: Secondary | ICD-10-CM

## 2016-10-14 DIAGNOSIS — E871 Hypo-osmolality and hyponatremia: Secondary | ICD-10-CM | POA: Insufficient documentation

## 2016-10-14 DIAGNOSIS — Z87891 Personal history of nicotine dependence: Secondary | ICD-10-CM | POA: Diagnosis not present

## 2016-10-14 DIAGNOSIS — J449 Chronic obstructive pulmonary disease, unspecified: Secondary | ICD-10-CM | POA: Diagnosis not present

## 2016-10-14 DIAGNOSIS — K769 Liver disease, unspecified: Secondary | ICD-10-CM | POA: Diagnosis not present

## 2016-10-14 DIAGNOSIS — M109 Gout, unspecified: Secondary | ICD-10-CM | POA: Diagnosis not present

## 2016-10-14 LAB — CBC WITH DIFFERENTIAL/PLATELET
Basophils Absolute: 0 10*3/uL (ref 0–0.1)
Basophils Relative: 0 %
Eosinophils Absolute: 0 10*3/uL (ref 0–0.7)
Eosinophils Relative: 0 %
HCT: 37 % (ref 35.0–47.0)
Hemoglobin: 12.6 g/dL (ref 12.0–16.0)
Lymphocytes Relative: 28 %
Lymphs Abs: 1.2 10*3/uL (ref 1.0–3.6)
MCH: 30.5 pg (ref 26.0–34.0)
MCHC: 34.1 g/dL (ref 32.0–36.0)
MCV: 89.4 fL (ref 80.0–100.0)
Monocytes Absolute: 0.5 10*3/uL (ref 0.2–0.9)
Monocytes Relative: 11 %
Neutro Abs: 2.8 10*3/uL (ref 1.4–6.5)
Neutrophils Relative %: 61 %
Platelets: 162 10*3/uL (ref 150–440)
RBC: 4.14 MIL/uL (ref 3.80–5.20)
RDW: 14.1 % (ref 11.5–14.5)
WBC: 4.5 10*3/uL (ref 3.6–11.0)

## 2016-10-14 LAB — FERRITIN: Ferritin: 36 ng/mL (ref 11–307)

## 2016-10-14 NOTE — Progress Notes (Signed)
Pt here for IDA, dark stools due to iron pill. She is not having any problems with stools. Eating and drinking good. energy level is good

## 2016-10-14 NOTE — Progress Notes (Signed)
[facility]  Clinic day:  10/14/2016   Chief Complaint: Carla Huerta is a 65 y.o. female with anemia who is seen for 2 month assessment on oral iron.  HPI: The patient was last seen in the hematology clinic on 08/12/2016.  At that time, she was was doing well on oral iron.  Hematocrit had improved to 31.0.  She underwent EGD and colonoscopy by Dr. Midge Minium on 10/01/2016.  EGD revealed a medium size hiatal hernia, normal stomach and duodenum.  Colonoscopy revealed a normal colon.  Plan was for capsule study if anemia persists.  During the interim, she notes dark stools.  She was taking oral iron with orange juice, but ran out of orange juice.  She denies any change in ice pica.  She states that she needs an knee replacement.   Past Medical History:  Diagnosis Date  . Anemia   . COPD (chronic obstructive pulmonary disease) (HCC)   . Cough   . Depression   . Diaphragm paralysis   . Gout   . Hypertension   . Hyponatremia   . Insomnia   . Liver disease   . Nonalcoholic hepatosteatosis   . Renal disorder   . Sleep apnea   . Splenic laceration   . Vertigo     Past Surgical History:  Procedure Laterality Date  . APPENDECTOMY     when she was 55 yearsa old  . COLONOSCOPY WITH PROPOFOL N/A 10/01/2016   Procedure: COLONOSCOPY WITH PROPOFOL;  Surgeon: Midge Minium, MD;  Location: [facility] ENDOSCOPY;  Service: Endoscopy;  Laterality: N/A;  . ESOPHAGOGASTRODUODENOSCOPY (EGD) WITH PROPOFOL N/A 10/01/2016   Procedure: ESOPHAGOGASTRODUODENOSCOPY (EGD) WITH PROPOFOL;  Surgeon: Midge Minium, MD;  Location: [facility] ENDOSCOPY;  Service: Endoscopy;  Laterality: N/A;  . LIVER BIOPSY     2015  . REPLACEMENT TOTAL KNEE Right 2002  . TONSILLECTOMY AND ADENOIDECTOMY     when pt was 65 years old    History reviewed. No pertinent family history.  Social History:  reports that she has quit smoking. She uses smokeless tobacco. She reports that she does not drink  alcohol or use drugs.  She denies any exposure to radiation or toxins.  She lives in [place].  She is disabled.  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  The patient is alone today.  Allergies: No Known Allergies  Current Medications: Current Outpatient Prescriptions  Medication Sig Dispense Refill  . albuterol (PROVENTIL HFA;VENTOLIN HFA) 108 (90 Base) MCG/ACT inhaler Inhale 2 puffs into the lungs every 4 (four) hours as needed for wheezing or shortness of breath.    Marland Kitchen aspirin 81 MG chewable tablet Chew 81 mg by mouth at bedtime.    Marland Kitchen atorvastatin (LIPITOR) 40 MG tablet Take 40 mg by mouth at bedtime.    . cholecalciferol (VITAMIN D) 1000 units tablet Take 1,000 Units by mouth daily.    . colchicine 0.6 MG tablet Take 0.6 mg by mouth at bedtime.    . ferrous sulfate 325 (65 FE) MG EC tablet Take 1 tablet (325 mg total) by mouth 2 (two) times daily. 60 tablet 2  . furosemide (LASIX) 20 MG tablet Take 20 mg by mouth 3 (three) times a week. On Mondays, Wednesdays, and Fridays.    Marland Kitchen gabapentin (NEURONTIN) 300 MG capsule Take 300 mg by mouth 3 (three) times daily.    Marland Kitchen imipramine (TOFRANIL) 50 MG tablet     . lisinopril (PRINIVIL,ZESTRIL) 10 MG  tablet Take 10 mg by mouth daily.    Marland Kitchen. omeprazole (PRILOSEC) 40 MG capsule Take 40 mg by mouth at bedtime.    . tolterodine (DETROL LA) 4 MG 24 hr capsule Take 4 mg by mouth at bedtime.     No current facility-administered medications for this visit.     Review of Systems:  GENERAL:  Energy level is good.  No fevers or sweats.  Weight up 6 pounds. PERFORMANCE STATUS (ECOG):  2 HEENT:  No visual changes, sore throat, mouth sores or tenderness. Lungs: Shortness of breath with exertion.  No cough.  No hemoptysis. Cardiac:  No chest pain, palpitations, orthopnea, or PND. GI:  Dark stools on oral iron.  No nausea, vomiting, diarrhea, constipation, melena or hematochezia.  Ice pica (no change). GU: Bladder leakage.  No urgency, frequency,  dysuria, or hematuria. Musculoskeletal:  No back pain. Needs left knee replacement.  Clavicle pain.  No muscle tenderness. Extremities:  No pain or swelling. Skin:  No rashes or skin changes. Neuro:  No headache, numbness or weakness, balance or coordination issues. Endocrine:  No diabetes, thyroid issues, hot flashes or night sweats. Psych:  No mood changes, depression or anxiety. Pain:  No focal pain. Review of systems:  All other systems reviewed and found to be negative.  Physical Exam: Blood pressure 112/68, pulse (!) 105, temperature 98.8 F (37.1 C), temperature source Tympanic, resp. rate (!) 22, height 5\' 6"  (1.676 m), weight (!) 307 lb 15.7 oz (139.7 kg). GENERAL:  Well developed, well nourished, heavyset woman sitting comfortably in the exam room in no acute distress. MENTAL STATUS:  Alert and oriented to person, place and time. HEAD:  Long brown hair.  Normocephalic, atraumatic, face symmetric, no Cushingoid features. EYES:  Blue eyes.  Pupils equal round and reactive to light and accomodation.  No conjunctivitis or scleral icterus. ENT:  Oropharynx clear without lesion.  Upper dentures.  No lower teeth.  Tongue normal. Mucous membranes moist.  RESPIRATORY:  Clear to auscultation without rales, wheezes or rhonchi. CARDIOVASCULAR:  Regular rate and rhythm without murmur, rub or gallop. ABDOMEN:  Soft, non-tender, with active bowel sounds, and no appreciable hepatosplenomegaly.  No masses. SKIN:  No rashes, ulcers or lesions. EXTREMITIES: No edema, no skin discoloration or tenderness.  No palpable cords. LYMPH NODES: No palpable cervical, supraclavicular, axillary or inguinal adenopathy  NEUROLOGICAL: Unremarkable. PSYCH:  Appropriate.   Orders Only on 10/14/2016  Component Date Value Ref Range Status  . WBC 10/14/2016 4.5  3.6 - 11.0 K/uL Final  . RBC 10/14/2016 4.14  3.80 - 5.20 MIL/uL Final  . Hemoglobin 10/14/2016 12.6  12.0 - 16.0 g/dL Final  . HCT 86/57/846912/02/2016 37.0   35.0 - 47.0 % Final  . MCV 10/14/2016 89.4  80.0 - 100.0 fL Final  . MCH 10/14/2016 30.5  26.0 - 34.0 pg Final  . MCHC 10/14/2016 34.1  32.0 - 36.0 g/dL Final  . RDW 62/95/284112/02/2016 14.1  11.5 - 14.5 % Final  . Platelets 10/14/2016 162  150 - 440 K/uL Final  . Neutrophils Relative % 10/14/2016 61  % Final  . Neutro Abs 10/14/2016 2.8  1.4 - 6.5 K/uL Final  . Lymphocytes Relative 10/14/2016 28  % Final  . Lymphs Abs 10/14/2016 1.2  1.0 - 3.6 K/uL Final  . Monocytes Relative 10/14/2016 11  % Final  . Monocytes Absolute 10/14/2016 0.5  0.2 - 0.9 K/uL Final  . Eosinophils Relative 10/14/2016 0  % Final  . Eosinophils Absolute  10/14/2016 0.0  0 - 0.7 K/uL Final  . Basophils Relative 10/14/2016 0  % Final  . Basophils Absolute 10/14/2016 0.0  0 - 0.1 K/uL Final  . Ferritin 10/14/2016 36  11 - 307 ng/mL Final    Assessment:  Carla Huerta is a 65 y.o. female with severe iron deficiency.  She presented with fatigue, dizziness, and shortness of breath.  Hematocrit was 17.3 with a hemoglobin of 5.1, and MCV 56.3.  She was transfused with 3 units of PRBCs.   Hemoglobin at discharge was 7.0.  Stool occult blood was negative.  She is on oral iron.  Work-up on 07/31/2016 revealed a hematocrit of 29.9, hemoglobin 9.2, MCV 65.9, platelets 232,000 and white count 6600.  Ferritin was 36.  Reticulocyte count was 4%.  B12 and folate were normal.  Creatinine was 1.37 (CrCl 40 ml/min).  Urinalysis revealed no blood.  Diet is modest.  She denies any melena, hematochezia, hematuria or vaginal bleeding.  She has ice pica.  EGD and colonoscopy on 10/01/2016 were normal.  Plan was for capsule study if anemia persists.  She has a history of splenic laceration 4-5 years ago.  She is s/p splenectomy.  She was transfused 4 units of PRBCs.  Symptomatically, she feels good.  Exam is stable.  Hematocrit is normal.  Ferritin is 36.  Plan: 1.  Labs today:  CBC with diff, ferritin. 2.  Review  EGD and colonoscopy.  Plan for  capsule study if anemia recurs after discontinuation of oral iron. 3.  Continue ferrous sulfate to 325 mg BID with OJ or vitamin C. 4.  Discuss diet issues. 5.  Patient requests f/u with PCP [facility]  Recommend labs (CBC with diff, ferritin) in 2 months. Continue oral iron until ferritin near 100.  Check labs (CBC and ferritin) 3 months after dscontinuation.  If hematocrit dropping at that point, would pursue capsule study. 6.  RTC prn   Rosey BathMelissa C Corcoran, MD  10/14/2016, 11:15 AM

## 2016-12-18 ENCOUNTER — Other Ambulatory Visit: Payer: Self-pay | Admitting: Family Medicine

## 2016-12-18 DIAGNOSIS — E559 Vitamin D deficiency, unspecified: Secondary | ICD-10-CM

## 2016-12-18 DIAGNOSIS — Z780 Asymptomatic menopausal state: Secondary | ICD-10-CM

## 2016-12-18 DIAGNOSIS — Z0000 Encounter for general adult medical examination without abnormal findings: Secondary | ICD-10-CM

## 2017-01-27 ENCOUNTER — Ambulatory Visit
Admission: RE | Admit: 2017-01-27 | Discharge: 2017-01-27 | Disposition: A | Discharge status: Home / Self Care | Payer: Medicare HMO | Source: Ambulatory Visit | Attending: Family Medicine | Admitting: Family Medicine

## 2017-01-27 DIAGNOSIS — Z780 Asymptomatic menopausal state: Secondary | ICD-10-CM

## 2017-01-27 DIAGNOSIS — E559 Vitamin D deficiency, unspecified: Secondary | ICD-10-CM

## 2017-01-27 DIAGNOSIS — M810 Age-related osteoporosis without current pathological fracture: Secondary | ICD-10-CM | POA: Diagnosis not present

## 2017-01-27 DIAGNOSIS — Z1231 Encounter for screening mammogram for malignant neoplasm of breast: Secondary | ICD-10-CM | POA: Diagnosis not present

## 2017-01-27 DIAGNOSIS — Z0000 Encounter for general adult medical examination without abnormal findings: Secondary | ICD-10-CM

## 2018-01-22 ENCOUNTER — Other Ambulatory Visit: Payer: Self-pay | Admitting: Family Medicine

## 2018-01-22 DIAGNOSIS — Z1231 Encounter for screening mammogram for malignant neoplasm of breast: Secondary | ICD-10-CM

## 2018-02-19 ENCOUNTER — Ambulatory Visit
Admission: RE | Admit: 2018-02-19 | Discharge: 2018-02-19 | Disposition: A | Discharge status: Home / Self Care | Payer: Medicare HMO | Source: Ambulatory Visit | Attending: Family Medicine | Admitting: Family Medicine

## 2018-02-19 DIAGNOSIS — Z1231 Encounter for screening mammogram for malignant neoplasm of breast: Secondary | ICD-10-CM | POA: Diagnosis not present

## 2018-02-19 DIAGNOSIS — R928 Other abnormal and inconclusive findings on diagnostic imaging of breast: Secondary | ICD-10-CM | POA: Insufficient documentation

## 2018-02-26 ENCOUNTER — Other Ambulatory Visit: Payer: Self-pay | Admitting: Family Medicine

## 2018-02-26 DIAGNOSIS — R928 Other abnormal and inconclusive findings on diagnostic imaging of breast: Secondary | ICD-10-CM

## 2018-02-27 ENCOUNTER — Ambulatory Visit
Admission: RE | Admit: 2018-02-27 | Discharge: 2018-02-27 | Disposition: A | Discharge status: Home / Self Care | Payer: Medicare HMO | Source: Ambulatory Visit | Attending: Family Medicine | Admitting: Family Medicine

## 2018-02-27 DIAGNOSIS — R928 Other abnormal and inconclusive findings on diagnostic imaging of breast: Secondary | ICD-10-CM | POA: Diagnosis not present

## 2018-11-13 ENCOUNTER — Other Ambulatory Visit: Payer: Self-pay

## 2018-11-13 ENCOUNTER — Emergency Department
Admission: EM | Admit: 2018-11-13 | Discharge: 2018-11-13 | Disposition: A | Discharge status: Home / Self Care | Payer: Medicare HMO | Attending: Student in an Organized Health Care Education/Training Program | Admitting: Student in an Organized Health Care Education/Training Program

## 2018-11-13 ENCOUNTER — Emergency Department: Payer: Medicare HMO

## 2018-11-13 ENCOUNTER — Encounter: Payer: Self-pay | Admitting: *Deleted

## 2018-11-13 DIAGNOSIS — Y939 Activity, unspecified: Secondary | ICD-10-CM | POA: Diagnosis not present

## 2018-11-13 DIAGNOSIS — Y998 Other external cause status: Secondary | ICD-10-CM | POA: Insufficient documentation

## 2018-11-13 DIAGNOSIS — S99912A Unspecified injury of left ankle, initial encounter: Secondary | ICD-10-CM | POA: Diagnosis present

## 2018-11-13 DIAGNOSIS — S82392A Other fracture of lower end of left tibia, initial encounter for closed fracture: Secondary | ICD-10-CM | POA: Diagnosis not present

## 2018-11-13 DIAGNOSIS — W19XXXA Unspecified fall, initial encounter: Secondary | ICD-10-CM | POA: Diagnosis not present

## 2018-11-13 DIAGNOSIS — I10 Essential (primary) hypertension: Secondary | ICD-10-CM | POA: Insufficient documentation

## 2018-11-13 DIAGNOSIS — Z96651 Presence of right artificial knee joint: Secondary | ICD-10-CM | POA: Insufficient documentation

## 2018-11-13 DIAGNOSIS — Y929 Unspecified place or not applicable: Secondary | ICD-10-CM | POA: Insufficient documentation

## 2018-11-13 DIAGNOSIS — J449 Chronic obstructive pulmonary disease, unspecified: Secondary | ICD-10-CM | POA: Diagnosis not present

## 2018-11-13 DIAGNOSIS — Z7982 Long term (current) use of aspirin: Secondary | ICD-10-CM | POA: Diagnosis not present

## 2018-11-13 DIAGNOSIS — Z79899 Other long term (current) drug therapy: Secondary | ICD-10-CM | POA: Diagnosis not present

## 2018-11-13 DIAGNOSIS — F17228 Nicotine dependence, chewing tobacco, with other nicotine-induced disorders: Secondary | ICD-10-CM | POA: Insufficient documentation

## 2018-11-13 MED ORDER — HYDROCODONE-ACETAMINOPHEN 5-325 MG PO TABS: 1.0000 | ORAL_TABLET | ORAL | 0 refills | Status: DC | PRN

## 2018-11-13 NOTE — ED Triage Notes (Signed)
PT reports twisting left ankle three days ago while walking today. Swelling and bruising noted. Pt arrived in a booth but family reports it was an old one they had at home.

## 2018-11-13 NOTE — ED Provider Notes (Signed)
[facility] Emergency Department Provider Note    First MD Initiated Contact with Patient 11/13/18 1339     (approximate)  I have reviewed the triage vital signs and the nursing notes.   HISTORY  Chief Complaint Ankle Pain    HPI Carla Huerta is a 68 y.o. female is for evaluation of 3 days of left ankle pain difficulty walking secondary to pain after mechanical fall 3 days ago.  Was recently treated for gout.  No recent antibiotics.  Denies any shortness of breath.  No other associated pain.  Does have chronic knee pain but no worsening of pain.  She is been wearing a walking boot for the past several days.  Daughter finally convinced her to come to the ER for evaluation.    Past Medical History:  Diagnosis Date  . Anemia   . COPD (chronic obstructive pulmonary disease) (HCC)   . Cough   . Depression   . Diaphragm paralysis   . Gout   . Hypertension   . Hyponatremia   . Insomnia   . Liver disease   . Nonalcoholic hepatosteatosis   . Renal disorder   . Sleep apnea   . Splenic laceration   . Vertigo    Family History  Problem Relation Age of Onset  . Breast cancer Neg Hx    Past Surgical History:  Procedure Laterality Date  . APPENDECTOMY     when she was 55 yearsa old  . COLONOSCOPY WITH PROPOFOL N/A 10/01/2016   Procedure: COLONOSCOPY WITH PROPOFOL;  Surgeon: Midge Minium, MD;  Location: [facility] ENDOSCOPY;  Service: Endoscopy;  Laterality: N/A;  . ESOPHAGOGASTRODUODENOSCOPY (EGD) WITH PROPOFOL N/A 10/01/2016   Procedure: ESOPHAGOGASTRODUODENOSCOPY (EGD) WITH PROPOFOL;  Surgeon: Midge Minium, MD;  Location: [facility] ENDOSCOPY;  Service: Endoscopy;  Laterality: N/A;  . LIVER BIOPSY     2015  . REPLACEMENT TOTAL KNEE Right 2002  . TONSILLECTOMY AND ADENOIDECTOMY     when pt was 68 years old   Patient Active Problem List   Diagnosis Date Noted  . Iron deficiency anemia 10/14/2016  . Blood loss anemia   . Hernia, hiatal   . Anemia 07/25/2016   . Gallstone       Prior to Admission medications   Medication Sig Start Date End Date Taking? Authorizing Provider  albuterol (PROVENTIL HFA;VENTOLIN HFA) 108 (90 Base) MCG/ACT inhaler Inhale 2 puffs into the lungs every 4 (four) hours as needed for wheezing or shortness of breath.    [provider]  aspirin 81 MG chewable tablet Chew 81 mg by mouth at bedtime.    [provider]  atorvastatin (LIPITOR) 40 MG tablet Take 40 mg by mouth at bedtime.    [provider]  cholecalciferol (VITAMIN D) 1000 units tablet Take 1,000 Units by mouth daily.    [provider]  colchicine 0.6 MG tablet Take 0.6 mg by mouth at bedtime.    [provider]  ferrous sulfate 325 (65 FE) MG EC tablet Take 1 tablet (325 mg total) by mouth 2 (two) times daily. 07/26/16   Houston Siren, MD  furosemide (LASIX) 20 MG tablet Take 20 mg by mouth 3 (three) times a week. On Mondays, Wednesdays, and Fridays.    [provider]  gabapentin (NEURONTIN) 300 MG capsule Take 300 mg by mouth 3 (three) times daily.    [provider]  imipramine (TOFRANIL) 50 MG tablet  08/09/16   [provider]  lisinopril (  PRINIVIL,ZESTRIL) 10 MG tablet Take 10 mg by mouth daily.    [provider]  omeprazole (PRILOSEC) 40 MG capsule Take 40 mg by mouth at bedtime.    [provider]  tolterodine (DETROL LA) 4 MG 24 hr capsule Take 4 mg by mouth at bedtime.    [provider]    Allergies Patient has no known allergies.    Social History Social History   Tobacco Use  . Smoking status: Former Games developermoker  . Smokeless tobacco: Current User  Substance Use Topics  . Alcohol use: No  . Drug use: No    Review of Systems Patient denies headaches, rhinorrhea, blurry vision, numbness, shortness of breath, chest pain, edema, cough, abdominal pain, nausea, vomiting, diarrhea, dysuria, fevers, rashes or hallucinations unless otherwise stated  above in HPI. ____________________________________________   PHYSICAL EXAM:  VITAL SIGNS: Vitals:   11/13/18 1323  BP: (!) 185/90  Pulse: (!) 120  Resp: 16  Temp: 99.2 F (37.3 C)  SpO2: 99%    Constitutional: Alert and oriented.  Eyes: Conjunctivae are normal.  Head: Atraumatic. Nose: No congestion/rhinnorhea. Mouth/Throat: Mucous membranes are moist.   Neck: No stridor. Painless ROM.  Cardiovascular:mildly tachycardic. regular rhythm. Grossly normal heart sounds.  Good peripheral circulation.   Respiratory: Normal respiratory effort.  No retractions.  Gastrointestinal: obese Genitourinary: deferred Musculoskeletal: Palpation of swelling and ecchymosis of the left ankle.  No tenderness palpation of the proximal fibula.  Neurovascular intact distally.  Good cap refill.  Neurologic:  Normal speech and language. No gross focal neurologic deficits are appreciated. No facial droop Skin:  Skin is warm, dry and intact. No rash noted. Psychiatric: Mood and affect are normal. Speech and behavior are normal.  ____________________________________________   LABS (all labs ordered are listed, but only abnormal results are displayed)  No results found for this or any previous visit (from the past 24 hour(s)). ____________________________________________  ____________________________________________  RADIOLOGY  I personally reviewed all radiographic images ordered to evaluate for the above acute complaints and reviewed radiology reports and findings.  These findings were personally discussed with the patient.  Please see medical record for radiology report.  ____________________________________________   PROCEDURES  Procedure(s) performed:  Procedures    Critical Care performed: no ____________________________________________   INITIAL IMPRESSION / ASSESSMENT AND PLAN / ED COURSE  Pertinent labs & imaging results that were available during my care of the patient were  reviewed by me and considered in my medical decision making (see chart for details).   DDX: .fracture, dislocation, contusion   Carla Crockervelyn H Huerta is a 68 y.o. who presents to the ED with 68 y.o. female with acute left ankle injury. Patient is AFVSS in ED. Exam as above. Given current presentation have considered the above differential. Denies any other injuries. Denies motor or sensory loss. Able to bear weight. Afebrile and VSS in Ed. Exam as above. NV intact throughout and distal to injury. Pt able to range joint. No clinical suspicion for infectious process or septic joint. Treatments will include observation, X-rays.  X-rays w/ e/o fracture.  Will place in splint. No other injuries reported or noted on exam.  Discussed supportive care and follow up with pt.       As part of my medical decision making, I reviewed the following data within the electronic MEDICAL RECORD NUMBER Nursing notes reviewed and incorporated, Labs reviewed, notes from prior ED visits and [place] Controlled Substance Database   ____________________________________________   FINAL CLINICAL IMPRESSION(S) / ED DIAGNOSES  Final diagnoses:  Other closed fracture of distal end of left tibia, initial encounter      NEW MEDICATIONS STARTED DURING THIS VISIT:  New Prescriptions   No medications on file     Note:  This document was prepared using Dragon voice recognition software and may include unintentional dictation errors.    Willy Eddyobinson, Ugochi Henzler, MD 11/13/18 1359

## 2019-04-12 ENCOUNTER — Other Ambulatory Visit: Payer: Self-pay | Admitting: Family Medicine

## 2019-04-12 DIAGNOSIS — Z1231 Encounter for screening mammogram for malignant neoplasm of breast: Secondary | ICD-10-CM

## 2019-05-26 ENCOUNTER — Ambulatory Visit
Admission: RE | Admit: 2019-05-26 | Discharge: 2019-05-26 | Disposition: A | Discharge status: Home / Self Care | Payer: Medicare HMO | Source: Ambulatory Visit | Attending: Family Medicine | Admitting: Family Medicine

## 2019-05-26 ENCOUNTER — Other Ambulatory Visit: Payer: Self-pay

## 2019-05-26 DIAGNOSIS — Z1231 Encounter for screening mammogram for malignant neoplasm of breast: Secondary | ICD-10-CM | POA: Insufficient documentation

## 2019-06-07 ENCOUNTER — Other Ambulatory Visit: Payer: Self-pay | Admitting: Family Medicine

## 2019-06-07 DIAGNOSIS — R928 Other abnormal and inconclusive findings on diagnostic imaging of breast: Secondary | ICD-10-CM

## 2019-06-09 ENCOUNTER — Ambulatory Visit
Admission: RE | Admit: 2019-06-09 | Discharge: 2019-06-09 | Disposition: A | Discharge status: Home / Self Care | Payer: Medicare HMO | Source: Ambulatory Visit | Attending: Family Medicine | Admitting: Family Medicine

## 2019-06-09 DIAGNOSIS — R928 Other abnormal and inconclusive findings on diagnostic imaging of breast: Secondary | ICD-10-CM | POA: Insufficient documentation

## 2019-06-14 ENCOUNTER — Other Ambulatory Visit: Payer: Self-pay | Admitting: Family Medicine

## 2019-06-14 DIAGNOSIS — R921 Mammographic calcification found on diagnostic imaging of breast: Secondary | ICD-10-CM

## 2019-06-14 DIAGNOSIS — R928 Other abnormal and inconclusive findings on diagnostic imaging of breast: Secondary | ICD-10-CM

## 2019-06-17 ENCOUNTER — Other Ambulatory Visit: Payer: Self-pay

## 2019-06-17 ENCOUNTER — Ambulatory Visit
Admission: RE | Admit: 2019-06-17 | Discharge: 2019-06-17 | Disposition: A | Discharge status: Home / Self Care | Payer: Medicare HMO | Source: Ambulatory Visit | Attending: Family Medicine | Admitting: Family Medicine

## 2019-06-17 DIAGNOSIS — R921 Mammographic calcification found on diagnostic imaging of breast: Secondary | ICD-10-CM | POA: Insufficient documentation

## 2019-06-17 DIAGNOSIS — R928 Other abnormal and inconclusive findings on diagnostic imaging of breast: Secondary | ICD-10-CM | POA: Insufficient documentation

## 2019-06-17 HISTORY — PX: BREAST BIOPSY: SHX20

## 2019-06-22 LAB — SURGICAL PATHOLOGY

## 2019-07-12 ENCOUNTER — Ambulatory Visit: Payer: Self-pay | Admitting: General Surgery

## 2019-07-12 DIAGNOSIS — N6092 Unspecified benign mammary dysplasia of left breast: Secondary | ICD-10-CM

## 2019-08-30 ENCOUNTER — Other Ambulatory Visit: Payer: Self-pay | Admitting: General Surgery

## 2019-08-30 DIAGNOSIS — N6092 Unspecified benign mammary dysplasia of left breast: Secondary | ICD-10-CM

## 2019-09-10 ENCOUNTER — Other Ambulatory Visit: Payer: Self-pay

## 2019-09-10 ENCOUNTER — Encounter: Payer: Self-pay | Admitting: Oncology

## 2019-09-10 ENCOUNTER — Inpatient Hospital Stay: Payer: Medicare HMO | Attending: Oncology | Admitting: Oncology

## 2019-09-10 DIAGNOSIS — Z79899 Other long term (current) drug therapy: Secondary | ICD-10-CM

## 2019-09-10 DIAGNOSIS — G4700 Insomnia, unspecified: Secondary | ICD-10-CM | POA: Diagnosis not present

## 2019-09-10 DIAGNOSIS — Z803 Family history of malignant neoplasm of breast: Secondary | ICD-10-CM

## 2019-09-10 DIAGNOSIS — K219 Gastro-esophageal reflux disease without esophagitis: Secondary | ICD-10-CM | POA: Diagnosis not present

## 2019-09-10 DIAGNOSIS — Z7982 Long term (current) use of aspirin: Secondary | ICD-10-CM

## 2019-09-10 DIAGNOSIS — E785 Hyperlipidemia, unspecified: Secondary | ICD-10-CM | POA: Insufficient documentation

## 2019-09-10 DIAGNOSIS — N6092 Unspecified benign mammary dysplasia of left breast: Secondary | ICD-10-CM | POA: Diagnosis present

## 2019-09-10 DIAGNOSIS — I10 Essential (primary) hypertension: Secondary | ICD-10-CM | POA: Diagnosis not present

## 2019-09-10 DIAGNOSIS — Z87891 Personal history of nicotine dependence: Secondary | ICD-10-CM | POA: Insufficient documentation

## 2019-09-10 DIAGNOSIS — J449 Chronic obstructive pulmonary disease, unspecified: Secondary | ICD-10-CM | POA: Diagnosis not present

## 2019-09-10 NOTE — Progress Notes (Signed)
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

## 2019-09-12 DIAGNOSIS — N6092 Unspecified benign mammary dysplasia of left breast: Secondary | ICD-10-CM | POA: Insufficient documentation

## 2019-09-12 NOTE — Progress Notes (Signed)
[facility]  [phone] [phone]  ID: [name] OB: Aug 21, 1951  MR#: [id number]  FIE#:[id number]  Patient Care Team: Hillery Aldo, MD as PCP - General (Family Medicine)  CHIEF COMPLAINT: Atypical ductal hyperplasia of left breast.  INTERVAL HISTORY: Patient is a 68 year old female who was noted to have an abnormality on routine screening breast mammogram.  Subsequent biopsy did not reveal DCIS or invasive carcinoma, but benign atypical ductal hyperplasia.  Patient is referred for further evaluation for high risk breast cancer.  She currently feels well and is asymptomatic.  She has no neurologic complaints.  She denies any recent fevers or illnesses.  She has good appetite and denies weight loss.  She denies any chest pain, shortness of breath, cough, or hemoptysis.  She has no nausea, vomiting, constipation, or diarrhea.  She has no urinary complaints.  Patient feels at her baseline offers no specific complaints today.  REVIEW OF SYSTEMS:   Review of Systems  Constitutional: Negative.  Negative for fever, malaise/fatigue and weight loss.  Respiratory: Negative.  Negative for cough, hemoptysis and shortness of breath.   Cardiovascular: Negative.  Negative for chest pain and leg swelling.  Gastrointestinal: Negative.  Negative for abdominal pain.  Genitourinary: Negative.  Negative for dysuria.  Musculoskeletal: Negative.  Negative for back pain.  Skin: Negative.  Negative for rash.  Neurological: Negative.  Negative for dizziness, seizures, weakness and headaches.  Psychiatric/Behavioral: Negative.  The patient is not nervous/anxious.     As per HPI. Otherwise, a complete review of systems is negative.  PAST MEDICAL HISTORY: Past Medical History:  Diagnosis Date  . Anemia   . COPD (chronic obstructive pulmonary disease) (HCC)   . Cough   . Depression   . Diaphragm paralysis   . Gout   . Hypertension   . Hyponatremia   . Insomnia    . Liver disease   . Nonalcoholic hepatosteatosis   . Renal disorder   . Sleep apnea   . Splenic laceration   . Vertigo     PAST SURGICAL HISTORY: Past Surgical History:  Procedure Laterality Date  . APPENDECTOMY     when she was 33 yearsa old  . BREAST BIOPSY Left 06/17/2019   affirm bx distortion with calcs, coil clip, path pending  . COLONOSCOPY WITH PROPOFOL N/A 10/01/2016   Procedure: COLONOSCOPY WITH PROPOFOL;  Surgeon: Midge Minium, MD;  Location: [facility] ENDOSCOPY;  Service: Endoscopy;  Laterality: N/A;  . ESOPHAGOGASTRODUODENOSCOPY (EGD) WITH PROPOFOL N/A 10/01/2016   Procedure: ESOPHAGOGASTRODUODENOSCOPY (EGD) WITH PROPOFOL;  Surgeon: Midge Minium, MD;  Location: [facility] ENDOSCOPY;  Service: Endoscopy;  Laterality: N/A;  . LIVER BIOPSY     2015  . REPLACEMENT TOTAL KNEE Right 2002  . TONSILLECTOMY AND ADENOIDECTOMY     when pt was 68 years old    FAMILY HISTORY: Family History  Problem Relation Age of Onset  . Breast cancer Sister   . Breast cancer Paternal Grandmother   . Breast cancer Other     ADVANCED DIRECTIVES (Y/N):  N  HEALTH MAINTENANCE: Social History   Tobacco Use  . Smoking status: Former Smoker    Years: 1.00  . Smokeless tobacco: Current User  Substance Use Topics  . Alcohol use: No  . Drug use: No     Colonoscopy:  PAP:  Bone density:  Lipid panel:  No Known Allergies  Current Outpatient Medications  Medication Sig Dispense Refill  . albuterol (PROVENTIL HFA;VENTOLIN HFA) 108 (90 Base) MCG/ACT inhaler Inhale  2 puffs into the lungs every 4 (four) hours as needed for wheezing or shortness of breath.    Marland Kitchen. alendronate (FOSAMAX) 70 MG tablet Take 70 mg by mouth every Wednesday.     Marland Kitchen. aspirin 81 MG chewable tablet Chew 81 mg by mouth at bedtime.    Marland Kitchen. atorvastatin (LIPITOR) 40 MG tablet Take 40 mg by mouth daily.     . cholecalciferol (VITAMIN D) 1000 units tablet Take 1,000 Units by mouth daily.    . ferrous sulfate 325 (65 FE) MG EC tablet  Take 1 tablet (325 mg total) by mouth 2 (two) times daily. (Patient taking differently: Take 325 mg by mouth daily. ) 60 tablet 2  . furosemide (LASIX) 20 MG tablet Take 20 mg by mouth every Monday, Wednesday, and Friday.     . gabapentin (NEURONTIN) 300 MG capsule Take 900 mg by mouth 3 (three) times daily.     Marland Kitchen. imipramine (TOFRANIL) 50 MG tablet Take 100 mg by mouth at bedtime.     Marland Kitchen. lisinopril (PRINIVIL,ZESTRIL) 10 MG tablet Take 10 mg by mouth daily.    Marland Kitchen. omeprazole (PRILOSEC) 40 MG capsule Take 40 mg by mouth daily.     Marland Kitchen. SPIRIVA HANDIHALER 18 MCG inhalation capsule Place 18 mcg into inhaler and inhale daily.     Marland Kitchen. tolterodine (DETROL LA) 4 MG 24 hr capsule Take 4 mg by mouth at bedtime.    Marland Kitchen. COLCRYS 0.6 MG tablet Take 0.6 mg by mouth at bedtime.     No current facility-administered medications for this visit.     OBJECTIVE: Vitals:   09/10/19 1052  BP: (!) 142/89  Pulse: 96  Resp: 18  Temp: 98 F (36.7 C)     Body mass index is 51.7 kg/m.    ECOG FS:0 - Asymptomatic  General: Well-developed, well-nourished, no acute distress. Eyes: Pink conjunctiva, anicteric sclera. HEENT: Normocephalic, moist mucous membranes, clear oropharnyx. Breast: Patient requested exam be deferred today. Lungs: Clear to auscultation bilaterally. Heart: Regular rate and rhythm. No rubs, murmurs, or gallops. Abdomen: Soft, nontender, nondistended. No organomegaly noted, normoactive bowel sounds. Musculoskeletal: No edema, cyanosis, or clubbing. Neuro: Alert, answering all questions appropriately. Cranial nerves grossly intact. Skin: No rashes or petechiae noted. Psych: Normal affect. Lymphatics: No cervical, calvicular, axillary or inguinal LAD.   LAB RESULTS:  Lab Results  Component Value Date   NA 139 07/31/2016   K 4.1 07/31/2016   CL 106 07/31/2016   CO2 21 (L) 07/31/2016   GLUCOSE 101 (H) 07/31/2016   BUN 19 07/31/2016   CREATININE 1.37 (H) 07/31/2016   CALCIUM 9.6 07/31/2016   PROT  7.1 07/25/2016   ALBUMIN 3.7 07/25/2016   AST 37 07/25/2016   ALT 21 07/25/2016   ALKPHOS 126 07/25/2016   BILITOT 0.5 07/25/2016   GFRNONAA 40 (L) 07/31/2016   GFRAA 46 (L) 07/31/2016    Lab Results  Component Value Date   WBC 4.5 10/14/2016   NEUTROABS 2.8 10/14/2016   HGB 12.6 10/14/2016   HCT 37.0 10/14/2016   MCV 89.4 10/14/2016   PLT 162 10/14/2016     STUDIES: No results found.  ASSESSMENT: Atypical ductal hyperplasia of left breast.  PLAN:    1. Atypical ductal hyperplasia of left breast: Given patient's diagnosis and significant family history with sister with breast cancer in her 2140s as well as a paternal grandmother also with breast cancer at an early age, she is at high risk of developing breast cancer in the  future and may benefit from 5 years of prophylactic tamoxifen.  She has been instructed to proceed with lumpectomy as planned on September 23, 2019 since a portion of patients with atypical ductal hyperplasia do in fact have DCIS after the entire lesion is removed.  If the DCIS or invasive cancer is found in her final pathology, she will also require referral to radiation oncology.  Patient will return to clinic on October 14, 2019 to discuss her final pathology results and to discuss either treatment or prophylactic measures are needed.  I spent a total of 60 minutes face-to-face with the patient of which greater than 50% of the visit was spent in counseling and coordination of care as detailed above.   Patient expressed understanding and was in agreement with this plan. She also understands that She can call clinic at any time with any questions, concerns, or complaints.   Cancer Staging No matching staging information was found for the patient.  Lloyd Huger, MD   09/12/2019 7:09 AM

## 2019-09-17 ENCOUNTER — Other Ambulatory Visit: Payer: Self-pay

## 2019-09-17 ENCOUNTER — Encounter
Admission: RE | Admit: 2019-09-17 | Discharge: 2019-09-17 | Disposition: A | Discharge status: Home / Self Care | Payer: Medicare HMO | Source: Ambulatory Visit | Attending: General Surgery | Admitting: General Surgery

## 2019-09-17 DIAGNOSIS — I498 Other specified cardiac arrhythmias: Secondary | ICD-10-CM | POA: Diagnosis not present

## 2019-09-17 DIAGNOSIS — Z01818 Encounter for other preprocedural examination: Secondary | ICD-10-CM | POA: Insufficient documentation

## 2019-09-17 HISTORY — DX: Other specified postprocedural states: R11.2

## 2019-09-17 HISTORY — DX: Other specified postprocedural states: Z98.890

## 2019-09-17 LAB — CBC
HCT: 41.6 % (ref 36.0–46.0)
Hemoglobin: 14.1 g/dL (ref 12.0–15.0)
MCH: 32.8 pg (ref 26.0–34.0)
MCHC: 33.9 g/dL (ref 30.0–36.0)
MCV: 96.7 fL (ref 80.0–100.0)
Platelets: 170 10*3/uL (ref 150–400)
RBC: 4.3 MIL/uL (ref 3.87–5.11)
RDW: 14.1 % (ref 11.5–15.5)
WBC: 4.3 10*3/uL (ref 4.0–10.5)
nRBC: 0 % (ref 0.0–0.2)

## 2019-09-17 LAB — BASIC METABOLIC PANEL
Anion gap: 11 (ref 5–15)
BUN: 11 mg/dL (ref 8–23)
CO2: 24 mmol/L (ref 22–32)
Calcium: 9.5 mg/dL (ref 8.9–10.3)
Chloride: 103 mmol/L (ref 98–111)
Creatinine, Ser: 1.04 mg/dL — ABNORMAL HIGH (ref 0.44–1.00)
GFR calc Af Amer: >60 mL/min (ref >60)
GFR calc non Af Amer: 56 mL/min — ABNORMAL LOW (ref >60)
Glucose, Bld: 89 mg/dL (ref 70–99)
Potassium: 4 mmol/L (ref 3.5–5.1)
Sodium: 138 mmol/L (ref 135–145)

## 2019-09-17 NOTE — Patient Instructions (Signed)
Your procedure is scheduled on: Thursday 09/23/19 Report to [facility] AT 9:00 AM   Remember: Instructions that are not followed completely may result in serious medical risk, up to and including death, or upon the discretion of your surgeon and anesthesiologist your surgery may need to be rescheduled.     _X__ 1. Do not eat food after midnight the night before your procedure.                 No gum chewing or hard candies. You may drink clear liquids up to 2 hours                 before you are scheduled to arrive for your surgery- DO not drink clear                 liquids within 2 hours of the start of your surgery.                 Clear Liquids include:  water, apple juice without pulp, clear carbohydrate                 drink such as Clearfast or Gatorade, Black Coffee or Tea (Do not add                 anything to coffee or tea). Diabetics water only  __X__2.  On the morning of surgery brush your teeth with toothpaste and water, you                 may rinse your mouth with mouthwash if you wish.  Do not swallow any              toothpaste of mouthwash.     _X__ 3.  No Alcohol for 24 hours before or after surgery.   _X__ 4.  Do Not Smoke or use e-cigarettes For 24 Hours Prior to Your Surgery.                 Do not use any chewable tobacco products for at least 6 hours prior to                 surgery.  ____  5.  Bring all medications with you on the day of surgery if instructed.   __X__  6.  Notify your doctor if there is any change in your medical condition      (cold, fever, infections).     Do not wear jewelry, make-up, hairpins, clips or nail polish. Do not wear lotions, powders, or perfumes.  Do not shave 48 hours prior to surgery. Men may shave face and neck. Do not bring valuables to the hospital.    [facility] is not responsible for any belongings or valuables.  Contacts, dentures/partials or body piercings may not be worn into surgery. Bring a case for  your contacts, glasses or hearing aids, a denture cup will be supplied. Leave your suitcase in the car. After surgery it may be brought to your room. For patients admitted to the hospital, discharge time is determined by your treatment team.   Patients discharged the day of surgery will not be allowed to drive home.   Please read over the following fact sheets that you were given:   MRSA Information  __X__ Take these medicines the morning of surgery with A SIP OF WATER:    1. atorvastatin (LIPITOR  2. gabapentin (NEURONTIN  3. omeprazole (PRILOSEC  4.  5.  6.  ____ Fleet Enema (as directed)   __X__ Use CHG Soap/SAGE wipes as directed  __X__ Use inhalers on the day of surgery  ____ Stop metformin/Janumet/Farxiga 2 days prior to surgery    ____ Take 1/2 of usual insulin dose the night before surgery. No insulin the morning          of surgery.   ____ Stop Blood Thinners Coumadin/Plavix/Xarelto/Pleta/Pradaxa/Eliquis/Effient/Aspirin  on  Or contact your Surgeon, Cardiologist or Medical Doctor regarding  ability to stop your blood thinners  __X__ Stop Anti-inflammatories 7 days before surgery such as Advil, Ibuprofen, Motrin,  BC or Goodies Powder, Naprosyn, Naproxen, Aleve, Aspirin    __X__ Stop all herbal supplements, fish oil or vitamin E until after surgery.    __X__ Bring C-Pap to the hospital.

## 2019-09-20 ENCOUNTER — Other Ambulatory Visit
Admission: RE | Admit: 2019-09-20 | Discharge: 2019-09-20 | Disposition: A | Discharge status: Home / Self Care | Payer: Medicare HMO | Source: Ambulatory Visit | Attending: General Surgery | Admitting: General Surgery

## 2019-09-20 ENCOUNTER — Other Ambulatory Visit: Payer: Self-pay

## 2019-09-20 DIAGNOSIS — Z01812 Encounter for preprocedural laboratory examination: Secondary | ICD-10-CM | POA: Diagnosis present

## 2019-09-20 DIAGNOSIS — Z20828 Contact with and (suspected) exposure to other viral communicable diseases: Secondary | ICD-10-CM | POA: Insufficient documentation

## 2019-09-20 LAB — SARS CORONAVIRUS 2 (TAT 6-24 HRS): SARS Coronavirus 2: NEGATIVE

## 2019-09-23 ENCOUNTER — Ambulatory Visit: Payer: Medicare HMO | Admitting: Anesthesiology

## 2019-09-23 ENCOUNTER — Other Ambulatory Visit: Payer: Self-pay

## 2019-09-23 ENCOUNTER — Ambulatory Visit
Admission: RE | Admit: 2019-09-23 | Discharge: 2019-09-23 | Disposition: A | Discharge status: Home / Self Care | Payer: Medicare HMO | Attending: General Surgery | Admitting: General Surgery

## 2019-09-23 ENCOUNTER — Ambulatory Visit
Admission: RE | Admit: 2019-09-23 | Discharge: 2019-09-23 | Disposition: A | Discharge status: Home / Self Care | Payer: Medicare HMO | Source: Ambulatory Visit | Attending: General Surgery | Admitting: General Surgery

## 2019-09-23 ENCOUNTER — Encounter
Admission: RE | Disposition: A | Discharge status: Home / Self Care | Payer: Self-pay | Source: Home / Self Care | Attending: General Surgery

## 2019-09-23 DIAGNOSIS — F329 Major depressive disorder, single episode, unspecified: Secondary | ICD-10-CM | POA: Insufficient documentation

## 2019-09-23 DIAGNOSIS — I10 Essential (primary) hypertension: Secondary | ICD-10-CM | POA: Insufficient documentation

## 2019-09-23 DIAGNOSIS — N6489 Other specified disorders of breast: Secondary | ICD-10-CM | POA: Diagnosis not present

## 2019-09-23 DIAGNOSIS — Z96651 Presence of right artificial knee joint: Secondary | ICD-10-CM | POA: Insufficient documentation

## 2019-09-23 DIAGNOSIS — N6092 Unspecified benign mammary dysplasia of left breast: Secondary | ICD-10-CM | POA: Diagnosis present

## 2019-09-23 DIAGNOSIS — Z79899 Other long term (current) drug therapy: Secondary | ICD-10-CM | POA: Insufficient documentation

## 2019-09-23 DIAGNOSIS — Z7982 Long term (current) use of aspirin: Secondary | ICD-10-CM | POA: Insufficient documentation

## 2019-09-23 DIAGNOSIS — G4733 Obstructive sleep apnea (adult) (pediatric): Secondary | ICD-10-CM | POA: Diagnosis not present

## 2019-09-23 DIAGNOSIS — K219 Gastro-esophageal reflux disease without esophagitis: Secondary | ICD-10-CM | POA: Insufficient documentation

## 2019-09-23 DIAGNOSIS — Z803 Family history of malignant neoplasm of breast: Secondary | ICD-10-CM | POA: Diagnosis not present

## 2019-09-23 DIAGNOSIS — J449 Chronic obstructive pulmonary disease, unspecified: Secondary | ICD-10-CM | POA: Diagnosis not present

## 2019-09-23 DIAGNOSIS — Z87891 Personal history of nicotine dependence: Secondary | ICD-10-CM | POA: Insufficient documentation

## 2019-09-23 HISTORY — PX: BREAST LUMPECTOMY: SHX2

## 2019-09-23 HISTORY — PX: BREAST EXCISIONAL BIOPSY: SUR124

## 2019-09-23 HISTORY — PX: BREAST LUMPECTOMY WITH NEEDLE LOCALIZATION: SHX5759

## 2019-09-23 SURGERY — BREAST LUMPECTOMY WITH NEEDLE LOCALIZATION
Anesthesia: General | Laterality: Left

## 2019-09-23 MED ORDER — ACETAMINOPHEN 160 MG/5ML PO SOLN
325.0000 mg | ORAL | Status: DC | PRN
  Filled 2019-09-23: qty 20.3

## 2019-09-23 MED ORDER — FENTANYL CITRATE (PF) 100 MCG/2ML IJ SOLN
INTRAMUSCULAR | Status: AC
  Filled 2019-09-23: qty 2

## 2019-09-23 MED ORDER — PROPOFOL 10 MG/ML IV BOLUS
INTRAVENOUS | Status: DC | PRN
  Administered 2019-09-23: 150 mg via INTRAVENOUS

## 2019-09-23 MED ORDER — ONDANSETRON HCL 4 MG/2ML IJ SOLN
INTRAMUSCULAR | Status: AC
  Filled 2019-09-23: qty 2

## 2019-09-23 MED ORDER — CELECOXIB 200 MG PO CAPS
200.0000 mg | ORAL_CAPSULE | ORAL | Status: AC
  Administered 2019-09-23: 10:00:00 200 mg via ORAL

## 2019-09-23 MED ORDER — EPHEDRINE SULFATE 50 MG/ML IJ SOLN
INTRAMUSCULAR | Status: AC
  Filled 2019-09-23: qty 1

## 2019-09-23 MED ORDER — GABAPENTIN 300 MG PO CAPS: 300.0000 mg | ORAL_CAPSULE | ORAL | Status: DC

## 2019-09-23 MED ORDER — GABAPENTIN 300 MG PO CAPS
ORAL_CAPSULE | ORAL | Status: AC
  Filled 2019-09-23: qty 1

## 2019-09-23 MED ORDER — GLYCOPYRROLATE 0.2 MG/ML IJ SOLN
INTRAMUSCULAR | Status: DC | PRN
  Administered 2019-09-23: 0.2 mg via INTRAVENOUS

## 2019-09-23 MED ORDER — LACTATED RINGERS IV SOLN
INTRAVENOUS | Status: DC
  Administered 2019-09-23: 12:00:00 via INTRAVENOUS

## 2019-09-23 MED ORDER — ACETAMINOPHEN 325 MG PO TABS: 325.0000 mg | ORAL_TABLET | ORAL | Status: DC | PRN

## 2019-09-23 MED ORDER — HYDROCODONE-ACETAMINOPHEN 5-325 MG PO TABS: 1.0000 | ORAL_TABLET | Freq: Four times a day (QID) | ORAL | 0 refills | Status: DC | PRN

## 2019-09-23 MED ORDER — CELECOXIB 200 MG PO CAPS
ORAL_CAPSULE | ORAL | Status: AC
  Administered 2019-09-23: 10:00:00 200 mg via ORAL
  Filled 2019-09-23: qty 1

## 2019-09-23 MED ORDER — EPHEDRINE SULFATE 50 MG/ML IJ SOLN
INTRAMUSCULAR | Status: DC | PRN
  Administered 2019-09-23 (×2): 5 mg via INTRAVENOUS

## 2019-09-23 MED ORDER — SUCCINYLCHOLINE CHLORIDE 20 MG/ML IJ SOLN
INTRAMUSCULAR | Status: AC
  Filled 2019-09-23: qty 1

## 2019-09-23 MED ORDER — LIDOCAINE HCL (CARDIAC) PF 100 MG/5ML IV SOSY
PREFILLED_SYRINGE | INTRAVENOUS | Status: DC | PRN
  Administered 2019-09-23: 50 mg via INTRAVENOUS

## 2019-09-23 MED ORDER — ONDANSETRON HCL 4 MG/2ML IJ SOLN
INTRAMUSCULAR | Status: DC | PRN
  Administered 2019-09-23: 4 mg via INTRAVENOUS

## 2019-09-23 MED ORDER — CEFAZOLIN SODIUM-DEXTROSE 2-4 GM/100ML-% IV SOLN
INTRAVENOUS | Status: AC
  Filled 2019-09-23: qty 100

## 2019-09-23 MED ORDER — ACETAMINOPHEN 500 MG PO TABS
ORAL_TABLET | ORAL | Status: AC
  Administered 2019-09-23: 1000 mg via ORAL
  Filled 2019-09-23: qty 2

## 2019-09-23 MED ORDER — PROPOFOL 10 MG/ML IV BOLUS
INTRAVENOUS | Status: AC
  Filled 2019-09-23: qty 20

## 2019-09-23 MED ORDER — BUPIVACAINE-EPINEPHRINE (PF) 0.5% -1:200000 IJ SOLN
INTRAMUSCULAR | Status: DC | PRN
  Administered 2019-09-23: 18 mL

## 2019-09-23 MED ORDER — CHLORHEXIDINE GLUCONATE CLOTH 2 % EX PADS: 6.0000 | MEDICATED_PAD | Freq: Once | CUTANEOUS | Status: DC

## 2019-09-23 MED ORDER — MEPERIDINE HCL 50 MG/ML IJ SOLN: 6.2500 mg | INTRAMUSCULAR | Status: DC | PRN

## 2019-09-23 MED ORDER — HYDROCODONE-ACETAMINOPHEN 7.5-325 MG PO TABS
1.0000 | ORAL_TABLET | Freq: Once | ORAL | Status: DC | PRN
  Filled 2019-09-23: qty 1

## 2019-09-23 MED ORDER — DEXAMETHASONE SODIUM PHOSPHATE 10 MG/ML IJ SOLN
INTRAMUSCULAR | Status: DC | PRN
  Administered 2019-09-23: 10 mg via INTRAVENOUS

## 2019-09-23 MED ORDER — KETOROLAC TROMETHAMINE 30 MG/ML IJ SOLN: 30.0000 mg | Freq: Once | INTRAMUSCULAR | Status: DC | PRN

## 2019-09-23 MED ORDER — ACETAMINOPHEN 500 MG PO TABS
1000.0000 mg | ORAL_TABLET | ORAL | Status: AC
  Administered 2019-09-23: 10:00:00 1000 mg via ORAL

## 2019-09-23 MED ORDER — MIDAZOLAM HCL 2 MG/2ML IJ SOLN
INTRAMUSCULAR | Status: AC
  Filled 2019-09-23: qty 2

## 2019-09-23 MED ORDER — PHENYLEPHRINE HCL (PRESSORS) 10 MG/ML IV SOLN
INTRAVENOUS | Status: DC | PRN
  Administered 2019-09-23 (×4): 100 ug via INTRAVENOUS

## 2019-09-23 MED ORDER — EPINEPHRINE PF 1 MG/ML IJ SOLN
INTRAMUSCULAR | Status: AC
  Filled 2019-09-23: qty 1

## 2019-09-23 MED ORDER — DEXMEDETOMIDINE HCL 200 MCG/2ML IV SOLN
INTRAVENOUS | Status: DC | PRN
  Administered 2019-09-23: 4 ug via INTRAVENOUS

## 2019-09-23 MED ORDER — PROMETHAZINE HCL 25 MG/ML IJ SOLN: 6.2500 mg | INTRAMUSCULAR | Status: DC | PRN

## 2019-09-23 MED ORDER — DEXAMETHASONE SODIUM PHOSPHATE 10 MG/ML IJ SOLN
INTRAMUSCULAR | Status: AC
  Filled 2019-09-23: qty 1

## 2019-09-23 MED ORDER — FENTANYL CITRATE (PF) 100 MCG/2ML IJ SOLN
INTRAMUSCULAR | Status: DC | PRN
  Administered 2019-09-23 (×2): 50 ug via INTRAVENOUS

## 2019-09-23 MED ORDER — GLYCOPYRROLATE 0.2 MG/ML IJ SOLN
INTRAMUSCULAR | Status: AC
  Filled 2019-09-23: qty 1

## 2019-09-23 MED ORDER — SUCCINYLCHOLINE CHLORIDE 20 MG/ML IJ SOLN
INTRAMUSCULAR | Status: DC | PRN
  Administered 2019-09-23: 100 mg via INTRAVENOUS

## 2019-09-23 MED ORDER — BUPIVACAINE HCL (PF) 0.5 % IJ SOLN
INTRAMUSCULAR | Status: AC
  Filled 2019-09-23: qty 30

## 2019-09-23 MED ORDER — MIDAZOLAM HCL 2 MG/2ML IJ SOLN
INTRAMUSCULAR | Status: DC | PRN
  Administered 2019-09-23: 2 mg via INTRAVENOUS

## 2019-09-23 MED ORDER — FENTANYL CITRATE (PF) 100 MCG/2ML IJ SOLN: 25.0000 ug | INTRAMUSCULAR | Status: DC | PRN

## 2019-09-23 MED ORDER — CEFAZOLIN SODIUM-DEXTROSE 2-4 GM/100ML-% IV SOLN
2.0000 g | INTRAVENOUS | Status: AC
  Administered 2019-09-23: 13:00:00 2 g via INTRAVENOUS

## 2019-09-23 SURGICAL SUPPLY — 32 items
BINDER BREAST LRG (GAUZE/BANDAGES/DRESSINGS) IMPLANT
BINDER BREAST MEDIUM (GAUZE/BANDAGES/DRESSINGS) IMPLANT
BINDER BREAST XLRG (GAUZE/BANDAGES/DRESSINGS) IMPLANT
COVER WAND RF STERILE (DRAPES) ×3 IMPLANT
DERMABOND ADVANCED (GAUZE/BANDAGES/DRESSINGS) ×2
DERMABOND ADVANCED .7 DNX12 (GAUZE/BANDAGES/DRESSINGS) ×1 IMPLANT
DEVICE DUBIN SPECIMEN MAMMOGRA (MISCELLANEOUS) ×3 IMPLANT
DRAPE CHEST BREAST 77X106 FENE (MISCELLANEOUS) ×3 IMPLANT
DRAPE UTILITY 15X26 TOWEL STRL (DRAPES) ×6 IMPLANT
ELECT CAUTERY BLADE 6.4 (BLADE) ×3 IMPLANT
ELECT CAUTERY BLADE TIP 2.5 (TIP) ×3
ELECT REM PT RETURN 9FT ADLT (ELECTROSURGICAL) ×3
ELECTRODE CAUTERY BLDE TIP 2.5 (TIP) ×1 IMPLANT
ELECTRODE REM PT RTRN 9FT ADLT (ELECTROSURGICAL) ×1 IMPLANT
GLOVE BIO SURGEON STRL SZ7.5 (GLOVE) ×6 IMPLANT
GOWN STRL REUS W/ TWL LRG LVL3 (GOWN DISPOSABLE) ×2 IMPLANT
GOWN STRL REUS W/TWL LRG LVL3 (GOWN DISPOSABLE) ×4
KIT MARKER MARGIN INK (KITS) ×3 IMPLANT
NEEDLE HYPO 22GX1.5 SAFETY (NEEDLE) ×3 IMPLANT
NS IRRIG 1000ML POUR BTL (IV SOLUTION) ×3 IMPLANT
PACK BASIN MINOR ARMC (MISCELLANEOUS) ×3 IMPLANT
SPONGE LAP 18X18 RF (DISPOSABLE) ×3 IMPLANT
SUT MNCRL 4-0 (SUTURE) ×2
SUT MNCRL 4-0 27XMFL (SUTURE) ×1
SUT SILK 3 0 (SUTURE) ×2
SUT SILK 3-0 18XBRD TIE 12 (SUTURE) ×1 IMPLANT
SUT VIC AB 3-0 SH 8-18 (SUTURE) ×3 IMPLANT
SUTURE MNCRL 4-0 27XMF (SUTURE) ×1 IMPLANT
SYR 10ML LL (SYRINGE) ×3 IMPLANT
SYR BULB IRRIG 60ML STRL (SYRINGE) ×3 IMPLANT
TOWEL OR 17X26 4PK STRL BLUE (TOWEL DISPOSABLE) ×3 IMPLANT
WATER STERILE IRR 1000ML POUR (IV SOLUTION) ×3 IMPLANT

## 2019-09-23 NOTE — Interval H&P Note (Signed)
History and Physical Interval Note:  09/23/2019 11:17 AM  Carla Huerta  has presented today for surgery, with the diagnosis of LEFT BREAST ADH.  The various methods of treatment have been discussed with the patient and family. After consideration of risks, benefits and other options for treatment, the patient has consented to  Procedure(s): LEFT BREAST LUMPECTOMY WITH NEEDLE LOCALIZATION (Left) as a surgical intervention.  The patient's history has been reviewed, patient examined, no change in status, stable for surgery.  I have reviewed the patient's chart and labs.  Questions were answered to the patient's satisfaction.     [name]

## 2019-09-23 NOTE — Anesthesia Procedure Notes (Signed)
Procedure Name: Intubation Performed by: Colbi Staubs, CRNA Pre-anesthesia Checklist: Patient identified, Patient being monitored, Timeout performed, Emergency Drugs available and Suction available Patient Re-evaluated:Patient Re-evaluated prior to induction Oxygen Delivery Method: Circle system utilized Preoxygenation: Pre-oxygenation with 100% oxygen Induction Type: IV induction Ventilation: Mask ventilation without difficulty Laryngoscope Size: Miller and 2 Grade View: Grade I Tube type: Oral Tube size: 7.0 mm Number of attempts: 1 Airway Equipment and Method: Stylet Placement Confirmation: ETT inserted through vocal cords under direct vision,  positive ETCO2 and breath sounds checked- equal and bilateral Secured at: 21 cm Tube secured with: Tape Dental Injury: Teeth and Oropharynx as per pre-operative assessment        

## 2019-09-23 NOTE — H&P (Signed)
Carla Huerta  Location: [facility] Patient #: [id number] DOB: 03/16/1951 Married / Language: English / Race: White Female   History of Present Illness  The patient is a 68 year old female who presents with a breast mass. we are asked to see the patient in consultation by Dr. Evangeline Dakin to evaluate her for atypical ductal hyperplasia of the left breast. The patient is a 68 year old white female who recently went for a routine screening mammogram. At that time she was found to have some abnormal calcifications in the upper outer quadrant of the left breast. These were biopsied and came back as atypical duct hyperplasia. She does have a family history of breast cancer in her sister diagnosed recently. The patient also has significant COPD with obstructive sleep apnea and uses a CPAP machine at night. She is not on any blood thinners   Allergies No Known Allergies   Medication History  Gabapentin (300MG  Capsule, Oral) Active. Omeprazole (40MG  Capsule DR, Oral) Active. Furosemide (20MG  Tablet, Oral) Active. Lisinopril (10MG  Tablet, Oral) Active. Tolterodine Tartrate ER (4MG  Capsule ER 24HR, Oral) Active. Imipramine HCl (50MG  Tablet, Oral) Active. Atorvastatin Calcium (40MG  Tablet, Oral) Active. Colcrys (0.6MG  Tablet, Oral) Active. Aspirin (81MG  Tablet Chewable, Oral) Active. Fluzone High-Dose (0.5ML Susp Pref Syr, Intramuscular) Active. Alendronate Sodium (70MG  Tablet, Oral) Active. Ventolin HFA (108 (90 Base)MCG/ACT Aerosol Soln, Inhalation) Active. Spiriva HandiHaler (18MCG Capsule, Inhalation) Active. Allopurinol (100MG  Tablet, Oral) Active. Medications Reconciled    Review of Systems  General Not Present- Appetite Loss, Chills, Fatigue, Fever, Night Sweats, Weight Gain and Weight Loss. Note: All other systems negative (unless as noted in HPI & included Review of Systems) Skin Not Present- Change in Wart/Mole, Dryness, Hives, Jaundice, New  Lesions, Non-Healing Wounds, Rash and Ulcer. HEENT Not Present- Earache, Hearing Loss, Hoarseness, Nose Bleed, Oral Ulcers, Ringing in the Ears, Seasonal Allergies, Sinus Pain, Sore Throat, Visual Disturbances, Wears glasses/contact lenses and Yellow Eyes. Respiratory Present- Difficulty Breathing on Exertion. Not Present- Bloody sputum, Chronic Cough, Difficulty Breathing, Snoring and Wheezing. Breast Not Present- Breast Mass, Breast Pain, Nipple Discharge and Skin Changes. Cardiovascular Not Present- Chest Pain, Difficulty Breathing Lying Down, Leg Cramps, Palpitations, Rapid Heart Rate, Shortness of Breath and Swelling of Extremities. Gastrointestinal Not Present- Abdominal Pain, Bloating, Bloody Stool, Change in Bowel Habits, Chronic diarrhea, Constipation, Difficulty Swallowing, Excessive gas, Gets full quickly at meals, Hemorrhoids, Indigestion, Nausea, Rectal Pain and Vomiting. Female Genitourinary Not Present- Frequency, Nocturia, Painful Urination, Pelvic Pain and Urgency. Musculoskeletal Present- Joint Pain. Not Present- Back Pain, Joint Stiffness, Muscle Pain, Muscle Weakness and Swelling of Extremities. Neurological Not Present- Decreased Memory, Fainting, Headaches, Numbness, Seizures, Tingling, Tremor, Trouble walking and Weakness. Psychiatric Not Present- Anxiety, Bipolar, Change in Sleep Pattern, Depression, Fearful and Frequent crying. Endocrine Not Present- Cold Intolerance, Excessive Hunger, Hair Changes, Heat Intolerance, Hot flashes and New Diabetes. Hematology Not Present- Easy Bruising, Excessive bleeding, Gland problems, HIV and Persistent Infections.  Vitals  Weight: 310.25 lb Height: 65in Body Surface Area: 2.38 m Body Mass Index: 51.63 kg/m  Temp.: 97.63F(Oral)        Physical Exam General Mental Status-Alert. General Appearance-Consistent with stated age. Hydration-Well hydrated. Voice-Normal.  Head and Neck Head-normocephalic,  atraumatic with no lesions or palpable masses. Trachea-midline. Thyroid Gland Characteristics - normal size and consistency.  Eye Eyeball - Bilateral-Extraocular movements intact. Sclera/Conjunctiva - Bilateral-No scleral icterus.  Chest and Lung Exam Chest and lung exam reveals -quiet, even and easy respiratory effort with no use of accessory muscles and on  auscultation, normal breath sounds, no adventitious sounds and normal vocal resonance. Inspection Chest Wall - Normal. Back - normal.  Breast Note: there is no palpable mass in either breast. There is no palpable axillary, supraclavicular, or cervical lymphadenopathy.   Cardiovascular Cardiovascular examination reveals -normal heart sounds, regular rate and rhythm with no murmurs and normal pedal pulses bilaterally.  Abdomen Inspection Inspection of the abdomen reveals - No Hernias. Skin - Scar - no surgical scars. Palpation/Percussion Palpation and Percussion of the abdomen reveal - Soft, Non Tender, No Rebound tenderness, No Rigidity (guarding) and No hepatosplenomegaly. Auscultation Auscultation of the abdomen reveals - Bowel sounds normal.  Neurologic Neurologic evaluation reveals -alert and oriented x 3 with no impairment of recent or remote memory. Mental Status-Normal.  Musculoskeletal Normal Exam - Left-Upper Extremity Strength Normal and Lower Extremity Strength Normal. Normal Exam - Right-Upper Extremity Strength Normal and Lower Extremity Strength Normal.  Lymphatic Head & Neck  General Head & Neck Lymphatics: Bilateral - Description - Normal. Axillary  General Axillary Region: Bilateral - Description - Normal. Tenderness - Non Tender. Femoral & Inguinal  Generalized Femoral & Inguinal Lymphatics: Bilateral - Description - Normal. Tenderness - Non Tender.    Assessment & Plan ATYPICAL DUCTAL HYPERPLASIA OF LEFT BREAST (N60.92) Impression: the patient appears to have an area of  atypical ductal hyperplasia in the upper outer quadrant of the left breast. Because this can have an appearance similar to ductal carcinoma in situ I would recommend that this area be removed. She would also like to have this done. I have discussed with her in detail the risks and benefits of the operation as well as some of the technical aspects and she understands and wishes to proceed. I will plan for a left breast wire localized lumpectomy at [facility] regional. I will also refer her to the high risk clinic at the cancer center there to discuss risk reduction. Current Plans Referred to Oncology, for evaluation and follow up (Oncology). Routine.

## 2019-09-23 NOTE — Anesthesia Post-op Follow-up Note (Signed)
Anesthesia QCDR form completed.        

## 2019-09-23 NOTE — Transfer of Care (Signed)
Immediate Anesthesia Transfer of Care Note  Patient: Carla Huerta  Procedure(s) Performed: LEFT BREAST LUMPECTOMY WITH NEEDLE LOCALIZATION (Left )  Patient Location: PACU  Anesthesia Type:General  Level of Consciousness: awake, alert  and drowsy  Airway & Oxygen Therapy: Patient Spontanous Breathing and Patient connected to face mask oxygen  Post-op Assessment: Report given to RN, Post -op Vital signs reviewed and stable and Patient moving all extremities  Post vital signs: Reviewed and stable  Last Vitals:  Vitals Value Taken Time  BP 132/76 09/23/19 1336  Temp 36.3 C 09/23/19 1336  Pulse 98 09/23/19 1336  Resp 17 09/23/19 1336  SpO2 92 % 09/23/19 1336  Vitals shown include unvalidated device data.  Last Pain:  Vitals:   09/23/19 1016  TempSrc: Temporal  PainSc: 0-No pain         Complications: No apparent anesthesia complications

## 2019-09-23 NOTE — Op Note (Signed)
09/23/2019  12:14 PM  PATIENT:  Carla Huerta  68 y.o. female  PRE-OPERATIVE DIAGNOSIS:  LEFT BREAST ADH  POST-OPERATIVE DIAGNOSIS:  LEFT BREAST ADH  PROCEDURE:  Procedure(s): LEFT BREAST LUMPECTOMY WITH NEEDLE LOCALIZATION (Left)  SURGEON:  Surgeon(s) and Role:    Jovita Kussmaul, MD - Primary  PHYSICIAN ASSISTANT:   ASSISTANTS: none   ANESTHESIA:   local and general  EBL:  minimal   BLOOD ADMINISTERED:none  DRAINS: none   LOCAL MEDICATIONS USED:  MARCAINE     SPECIMEN:  Source of Specimen:  left breast tissue  DISPOSITION OF SPECIMEN:  PATHOLOGY  COUNTS:  YES  TOURNIQUET:  * No tourniquets in log *  DICTATION: .Dragon Dictation   After informed consent was obtained the patient was brought to the operating room and placed in the supine position on the operating table.  After adequate induction of general anesthesia the patient's left breast was prepped with ChloraPrep, allowed to dry, and draped in usual sterile manner.  An appropriate timeout was performed.  Earlier in the day the patient underwent a wire localization procedure and the wire was entering the left breast laterally and headed medially.  The path of the wire was palpated.  A curvilinear incision was made along the outer aspect of the left breast vertically with a 15 blade knife.  The incision was carried through the skin and subcutaneous tissue sharply with the electrocautery.  Dissection was then carried towards the end of the wire.  A circular portion of breast tissue was then excised sharply with the electrocautery around the end of the wire.  Once the specimen was removed it was oriented with the appropriate paint colors.  A specimen radiograph was obtained that showed the clip and wire to be near the center of the specimen.  The specimen was then sent to pathology for further evaluation.  Hemostasis was achieved using the Bovie electrocautery.  The wound was irrigated with saline and infiltrated with  more quarter percent Marcaine.  The deep layer of the wound was then closed with layers of interrupted 3-0 Vicryl stitches.  The skin was then closed with a running 4-0 Monocryl subcuticular stitch.  Dermabond dressings were applied.  The patient tolerated the procedure well.  At the end of the case all needle sponge and instrument counts were correct.  The patient was then awakened and taken to recovery in stable condition.  PLAN OF CARE: Discharge to home after PACU  PATIENT DISPOSITION:  PACU - hemodynamically stable.   Delay start of Pharmacological VTE agent (>24hrs) due to surgical blood loss or risk of bleeding: not applicable

## 2019-09-23 NOTE — Anesthesia Preprocedure Evaluation (Signed)
Anesthesia Evaluation  Patient identified by MRN, date of birth, ID band Patient awake    Reviewed: Allergy & Precautions, H&P , NPO status , reviewed documented beta blocker date and time   History of Anesthesia Complications (+) PONV and history of anesthetic complications  Airway Mallampati: II  TM Distance: >3 FB Neck ROM: full    Dental  (+) Edentulous Upper, Edentulous Lower   Pulmonary sleep apnea and Continuous Positive Airway Pressure Ventilation , COPD, former smoker,    Pulmonary exam normal        Cardiovascular hypertension, Normal cardiovascular exam     Neuro/Psych PSYCHIATRIC DISORDERS Depression  Neuromuscular disease    GI/Hepatic hiatal hernia, GERD  Medicated and Controlled,  Endo/Other    Renal/GU Renal disease     Musculoskeletal  (+) Arthritis ,   Abdominal   Peds  Hematology  (+) Blood dyscrasia, anemia ,   Anesthesia Other Findings Past Medical History: No date: Anemia No date: COPD (chronic obstructive pulmonary disease) (HCC) No date: Cough No date: Depression 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 No date: Gout No date: Hypertension No date: Hyponatremia No date: Insomnia No date: Liver disease 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 No date: PONV (postoperative nausea and vomiting) No date: Renal disorder No date: Sleep apnea No date: Splenic laceration No date: Vertigo Past Surgical History: No date: APPENDECTOMY     Comment:  when she was 54 yearsa old 06/17/2019: BREAST BIOPSY; Left     Comment:  affirm bx distortion with calcs, coil clip, path pending 09/23/2019: BREAST EXCISIONAL BIOPSY; Left     Comment:  Lt NL for ATYPICAL DUCTAL HYPERPLASIA WITH               MICROPAPILLARY ARCHITECTURE 10/01/2016: COLONOSCOPY WITH PROPOFOL; N/A     Comment:  Procedure: COLONOSCOPY WITH PROPOFOL;  Surgeon: Lucilla Lame, MD;  Location: [facility] END[facility];  Service: Endoscopy;             Laterality: N/A; 10/01/2016: ESOPHAGOGASTRODUODENOSCOPY (EGD) WITH PROPOFOL; N/A     Comment:  Procedure: ESOPHAGOGASTRODUODENOSCOPY (EGD) WITH               PROPOFOL;  Surgeon: Lucilla Lame, MD;  Location: [facility]               ENDOSCOPY;  Service: Endoscopy;  Laterality: N/A; No date: LIVER BIOPSY     Comment:  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; Right No date: TONSILLECTOMY AND ADENOIDECTOMY     Comment:  when pt was 68 years old   Reproductive/Obstetrics                             Anesthesia Physical Anesthesia Plan  ASA: III  Anesthesia Plan: General ETT   Post-op Pain Management:    Induction: Intravenous  PONV Risk Score and Plan: 3 and Ondansetron, Treatment may vary due to age or medical condition, Midazolam and Dexamethasone  Airway Management Planned: Oral ETT  Additional Equipment:   Intra-op Plan:   Post-operative Plan: Extubation in OR  Informed Consent: I have reviewed the patients History and Physical, chart, labs and discussed the procedure including the risks, benefits and alternatives for the proposed anesthesia with the patient or authorized representative who has indicated his/her understanding and acceptance.     Dental Advisory Given  Plan Discussed with: CRNA  Anesthesia Plan Comments:  Anesthesia Quick Evaluation  

## 2019-09-24 ENCOUNTER — Encounter: Payer: Self-pay | Admitting: General Surgery

## 2019-09-27 LAB — SURGICAL PATHOLOGY

## 2019-09-30 NOTE — Anesthesia Postprocedure Evaluation (Signed)
Anesthesia Post Note  Patient: Carla Huerta  Procedure(s) Performed: LEFT BREAST LUMPECTOMY WITH NEEDLE LOCALIZATION (Left )  Patient location during evaluation: PACU Anesthesia Type: General Level of consciousness: awake and alert Pain management: pain level controlled Vital Signs Assessment: post-procedure vital signs reviewed and stable Respiratory status: spontaneous breathing, nonlabored ventilation and respiratory function stable Cardiovascular status: blood pressure returned to baseline and stable Postop Assessment: no apparent nausea or vomiting Anesthetic complications: no     Last Vitals:  Vitals:   09/23/19 1422 09/23/19 1441  BP: (!) 100/59 133/70  Pulse: 97 93  Resp: 13 16  Temp: 36.6 C (!) 36.3 C  SpO2: 95% 92%    Last Pain:  Vitals:   09/24/19 0851  TempSrc:   PainSc: 0-No pain                 Alphonsus Sias

## 2019-10-06 NOTE — Progress Notes (Deleted)
[place]  Telephone:(336) [phone] Fax:(336) [phone]  ID: Carla Huerta OB: 20-Jun-1951  MR#: [id number]  JKK#:[id number]  Patient Care Team: Denton Lank, MD as PCP - General (Family Medicine)  I connected with Carla Huerta on 10/06/19 at  2:45 PM EST by {Blank single:19197::"video enabled telemedicine visit","telephone visit"} and verified that I am speaking with the correct person using two identifiers.   I discussed the limitations, risks, security and privacy concerns of performing an evaluation and management service by telemedicine and the availability of in-person appointments. I also discussed with the patient that there may be a patient responsible charge related to this service. The patient expressed understanding and agreed to proceed.   Other persons participating in the visit and their role in the encounter: ***   Patient's location: ***  Provider's location: ***   CHIEF COMPLAINT: Atypical ductal hyperplasia of left breast.  INTERVAL HISTORY: Patient is a 68 year old female who was noted to have an abnormality on routine screening breast mammogram.  Subsequent biopsy did not reveal DCIS or invasive carcinoma, but benign atypical ductal hyperplasia.  Patient is referred for further evaluation for high risk breast cancer.  She currently feels well and is asymptomatic.  She has no neurologic complaints.  She denies any recent fevers or illnesses.  She has good appetite and denies weight loss.  She denies any chest pain, shortness of breath, cough, or hemoptysis.  She has no nausea, vomiting, constipation, or diarrhea.  She has no urinary complaints.  Patient feels at her baseline offers no specific complaints today.  REVIEW OF SYSTEMS:   Review of Systems  Constitutional: Negative.  Negative for fever, malaise/fatigue and weight loss.  Respiratory: Negative.  Negative for cough, hemoptysis and shortness of breath.   Cardiovascular: Negative.  Negative  for chest pain and leg swelling.  Gastrointestinal: Negative.  Negative for abdominal pain.  Genitourinary: Negative.  Negative for dysuria.  Musculoskeletal: Negative.  Negative for back pain.  Skin: Negative.  Negative for rash.  Neurological: Negative.  Negative for dizziness, seizures, weakness and headaches.  Psychiatric/Behavioral: Negative.  The patient is not nervous/anxious.     As per HPI. Otherwise, a complete review of systems is negative.  PAST MEDICAL HISTORY: Past Medical History:  Diagnosis Date  . Anemia   . COPD (chronic obstructive pulmonary disease) ([place])   . Cough   . Depression   . Diaphragm paralysis   . Gout   . Hypertension   . Hyponatremia   . Insomnia   . Liver disease   . Nonalcoholic hepatosteatosis   . PONV (postoperative nausea and vomiting)   . Renal disorder   . Sleep apnea   . Splenic laceration   . Vertigo     PAST SURGICAL HISTORY: Past Surgical History:  Procedure Laterality Date  . APPENDECTOMY     when she was 69 yearsa old  . BREAST BIOPSY Left 06/17/2019   affirm bx distortion with calcs, coil clip, ADH  . BREAST LUMPECTOMY Left 09/23/2019   ADH  . BREAST LUMPECTOMY WITH NEEDLE LOCALIZATION Left 09/23/2019   Procedure: LEFT BREAST LUMPECTOMY WITH NEEDLE LOCALIZATION;  Surgeon: Jovita Kussmaul, MD;  Location: [facility] OR[facility];  Service: General;  Laterality: Left;  . COLONOSCOPY WITH PROPOFOL N/A 10/01/2016   Procedure: COLONOSCOPY WITH PROPOFOL;  Surgeon: Lucilla Lame, MD;  Location: [facility];  Service: Endoscopy;  Laterality: N/A;  . ESOPHAGOGASTRODUODENOSCOPY (EGD) WITH PROPOFOL N/A 10/01/2016   Procedure: ESOPHAGOGASTRODUODENOSCOPY (EGD) WITH PROPOFOL;  Surgeon: Evangeline Gula  Allen Norris, MD;  Location: [facility];  Service: Endoscopy;  Laterality: N/A;  . LIVER BIOPSY     2015  . REPLACEMENT TOTAL KNEE Right 2002  . TONSILLECTOMY AND ADENOIDECTOMY     when pt was 68 years old    FAMILY HISTORY: Family History  Problem Relation Age  of Onset  . Breast cancer Sister   . Breast cancer Paternal Grandmother   . Breast cancer Other     ADVANCED DIRECTIVES (Y/N):  N  HEALTH MAINTENANCE: Social History   Tobacco Use  . Smoking status: Former Smoker    Years: 1.00  . Smokeless tobacco: Current User  Substance Use Topics  . Alcohol use: No  . Drug use: No     Colonoscopy:  PAP:  Bone density:  Lipid panel:  No Known Allergies  Current Outpatient Medications  Medication Sig Dispense Refill  . albuterol (PROVENTIL HFA;VENTOLIN HFA) 108 (90 Base) MCG/ACT inhaler Inhale 2 puffs into the lungs every 4 (four) hours as needed for wheezing or shortness of breath.    Marland Kitchen alendronate (FOSAMAX) 70 MG tablet Take 70 mg by mouth every Wednesday.     Marland Kitchen aspirin 81 MG chewable tablet Chew 81 mg by mouth at bedtime.    Marland Kitchen atorvastatin (LIPITOR) 40 MG tablet Take 40 mg by mouth daily.     . cholecalciferol (VITAMIN D) 1000 units tablet Take 1,000 Units by mouth daily.    Marland Kitchen COLCRYS 0.6 MG tablet Take 0.6 mg by mouth at bedtime.    . ferrous sulfate 325 (65 FE) MG EC tablet Take 1 tablet (325 mg total) by mouth 2 (two) times daily. (Patient taking differently: Take 325 mg by mouth daily. ) 60 tablet 2  . furosemide (LASIX) 20 MG tablet Take 20 mg by mouth every Monday, Wednesday, and Friday.     . gabapentin (NEURONTIN) 300 MG capsule Take 900 mg by mouth 3 (three) times daily.     Marland Kitchen HYDROcodone-acetaminophen (NORCO/VICODIN) 5-325 MG tablet Take 1-2 tablets by mouth every 6 (six) hours as needed for moderate pain or severe pain. 15 tablet 0  . imipramine (TOFRANIL) 50 MG tablet Take 100 mg by mouth at bedtime.     Marland Kitchen lisinopril (PRINIVIL,ZESTRIL) 10 MG tablet Take 10 mg by mouth daily.    Marland Kitchen omeprazole (PRILOSEC) 40 MG capsule Take 40 mg by mouth daily.     Marland Kitchen SPIRIVA HANDIHALER 18 MCG inhalation capsule Place 18 mcg into inhaler and inhale daily.     Marland Kitchen tolterodine (DETROL LA) 4 MG 24 hr capsule Take 4 mg by mouth at bedtime.     No  current facility-administered medications for this visit.     OBJECTIVE: There were no vitals filed for this visit.   There is no height or weight on file to calculate BMI.    ECOG FS:0 - Asymptomatic  General: Well-developed, well-nourished, no acute distress. Eyes: Pink conjunctiva, anicteric sclera. HEENT: Normocephalic, moist mucous membranes, clear oropharnyx. Breast: Patient requested exam be deferred today. Lungs: Clear to auscultation bilaterally. Heart: Regular rate and rhythm. No rubs, murmurs, or gallops. Abdomen: Soft, nontender, nondistended. No organomegaly noted, normoactive bowel sounds. Musculoskeletal: No edema, cyanosis, or clubbing. Neuro: Alert, answering all questions appropriately. Cranial nerves grossly intact. Skin: No rashes or petechiae noted. Psych: Normal affect. Lymphatics: No cervical, calvicular, axillary or inguinal LAD.   LAB RESULTS:  Lab Results  Component Value Date   NA 138 09/17/2019   K 4.0 09/17/2019   CL 103  09/17/2019   CO2 24 09/17/2019   GLUCOSE 89 09/17/2019   BUN 11 09/17/2019   CREATININE 1.04 (H) 09/17/2019   CALCIUM 9.5 09/17/2019   PROT 7.1 07/25/2016   ALBUMIN 3.7 07/25/2016   AST 37 07/25/2016   ALT 21 07/25/2016   ALKPHOS 126 07/25/2016   BILITOT 0.5 07/25/2016   GFRNONAA 56 (L) 09/17/2019   GFRAA >60 09/17/2019    Lab Results  Component Value Date   WBC 4.3 09/17/2019   NEUTROABS 2.8 10/14/2016   HGB 14.1 09/17/2019   HCT 41.6 09/17/2019   MCV 96.7 09/17/2019   PLT 170 09/17/2019     STUDIES: Mm Breast Surgical Specimen  Result Date: 09/23/2019 CLINICAL DATA:  Left breast excision of atypical ductal hyperplasia. EXAM: SPECIMEN RADIOGRAPH OF THE LEFT BREAST COMPARISON:  Previous exam(s). FINDINGS: Status post excision of the left breast. The wire tip and coil shaped biopsy marker clip are present and marked for pathology. IMPRESSION: Specimen radiograph of the left breast. Electronically Signed   By: Claudie Revering M.D.   On: 09/23/2019 13:22   Mm Lt Plc Breast Loc Dev   1st Lesion  Inc Mammo Guide  Result Date: 09/23/2019 CLINICAL DATA:  Recently diagnosed atypical ductal hyperplasia in the upper-outer quadrant of the left breast. EXAM: NEEDLE LOCALIZATION OF THE LEFT BREAST WITH MAMMO GUIDANCE COMPARISON:  Previous exams. FINDINGS: Patient presents for needle localization prior to left breast excision. I met with the patient and we discussed the procedure of needle localization including benefits and alternatives. We discussed the high likelihood of a successful procedure. We discussed the risks of the procedure, including infection, bleeding, tissue injury, and further surgery. Informed, written consent was given. The usual time-out protocol was performed immediately prior to the procedure. Using mammographic guidance, sterile technique, 1% lidocaine and a 9 cm modified Kopans needle, the recently placed coil shaped biopsy marker clip was localized using a lateral approach. The images were marked for Dr. Marlou Starks. IMPRESSION: Needle localization left breast. No apparent complications. Electronically Signed   By: Claudie Revering M.D.   On: 09/23/2019 09:39    ASSESSMENT: Atypical ductal hyperplasia of left breast.  PLAN:    1. Atypical ductal hyperplasia of left breast: Given patient's diagnosis and significant family history with sister with breast cancer in her 4s as well as a paternal grandmother also with breast cancer at an early age, she is at high risk of developing breast cancer in the future and may benefit from 5 years of prophylactic tamoxifen.  She has been instructed to proceed with lumpectomy as planned on September 23, 2019 since a portion of patients with atypical ductal hyperplasia do in fact have DCIS after the entire lesion is removed.  If the DCIS or invasive cancer is found in her final pathology, she will also require referral to radiation oncology.  Patient will return to clinic on October 14, 2019 to discuss her final pathology results and to discuss either treatment or prophylactic measures are needed.  I provided *** minutes of {Blank single:19197::"face-to-face video visit time","non face-to-face telephone visit time"} during this encounter, and > 50% was spent counseling as documented under my assessment & plan.  Patient expressed understanding and was in agreement with this plan. She also understands that She can call clinic at any time with any questions, concerns, or complaints.   Cancer Staging No matching staging information was found for the patient.  Lloyd Huger, MD   10/06/2019 2:54 PM

## 2019-10-14 ENCOUNTER — Inpatient Hospital Stay: Payer: Medicare HMO | Admitting: Oncology

## 2019-10-17 NOTE — Progress Notes (Deleted)
[place]  Telephone:(336) [phone] Fax:(336) [phone]  ID: Carla Huerta OB: April 23, 1951  MR#: [id number]  XNT#:[id number]  Patient Care Team: Denton Lank, MD as PCP - General (Family Medicine)  I connected with Carla Huerta on 10/17/19 at 10:30 AM EST by {Blank single:19197::"video enabled telemedicine visit","telephone visit"} and verified that I am speaking with the correct person using two identifiers.   I discussed the limitations, risks, security and privacy concerns of performing an evaluation and management service by telemedicine and the availability of in-person appointments. I also discussed with the patient that there may be a patient responsible charge related to this service. The patient expressed understanding and agreed to proceed.   Other persons participating in the visit and their role in the encounter: ***   Patient's location: ***  Provider's location: ***   CHIEF COMPLAINT: Atypical ductal hyperplasia of left breast.  INTERVAL HISTORY: Patient is a 68 year old female who was noted to have an abnormality on routine screening breast mammogram.  Subsequent biopsy did not reveal DCIS or invasive carcinoma, but benign atypical ductal hyperplasia.  Patient is referred for further evaluation for high risk breast cancer.  She currently feels well and is asymptomatic.  She has no neurologic complaints.  She denies any recent fevers or illnesses.  She has good appetite and denies weight loss.  She denies any chest pain, shortness of breath, cough, or hemoptysis.  She has no nausea, vomiting, constipation, or diarrhea.  She has no urinary complaints.  Patient feels at her baseline offers no specific complaints today.  REVIEW OF SYSTEMS:   Review of Systems  Constitutional: Negative.  Negative for fever, malaise/fatigue and weight loss.  Respiratory: Negative.  Negative for cough, hemoptysis and shortness of breath.   Cardiovascular: Negative.  Negative  for chest pain and leg swelling.  Gastrointestinal: Negative.  Negative for abdominal pain.  Genitourinary: Negative.  Negative for dysuria.  Musculoskeletal: Negative.  Negative for back pain.  Skin: Negative.  Negative for rash.  Neurological: Negative.  Negative for dizziness, seizures, weakness and headaches.  Psychiatric/Behavioral: Negative.  The patient is not nervous/anxious.     As per HPI. Otherwise, a complete review of systems is negative.  PAST MEDICAL HISTORY: Past Medical History:  Diagnosis Date  . Anemia   . COPD (chronic obstructive pulmonary disease) ([place])   . Cough   . Depression   . Diaphragm paralysis   . Gout   . Hypertension   . Hyponatremia   . Insomnia   . Liver disease   . Nonalcoholic hepatosteatosis   . PONV (postoperative nausea and vomiting)   . Renal disorder   . Sleep apnea   . Splenic laceration   . Vertigo     PAST SURGICAL HISTORY: Past Surgical History:  Procedure Laterality Date  . APPENDECTOMY     when she was 10 yearsa old  . BREAST BIOPSY Left 06/17/2019   affirm bx distortion with calcs, coil clip, ADH  . BREAST LUMPECTOMY Left 09/23/2019   ADH  . BREAST LUMPECTOMY WITH NEEDLE LOCALIZATION Left 09/23/2019   Procedure: LEFT BREAST LUMPECTOMY WITH NEEDLE LOCALIZATION;  Surgeon: Jovita Kussmaul, MD;  Location: [facility] OR[facility];  Service: General;  Laterality: Left;  . COLONOSCOPY WITH PROPOFOL N/A 10/01/2016   Procedure: COLONOSCOPY WITH PROPOFOL;  Surgeon: Lucilla Lame, MD;  Location: [facility];  Service: Endoscopy;  Laterality: N/A;  . ESOPHAGOGASTRODUODENOSCOPY (EGD) WITH PROPOFOL N/A 10/01/2016   Procedure: ESOPHAGOGASTRODUODENOSCOPY (EGD) WITH PROPOFOL;  Surgeon: Lucilla Lame,  MD;  Location: [facility];  Service: Endoscopy;  Laterality: N/A;  . LIVER BIOPSY     2015  . REPLACEMENT TOTAL KNEE Right 2002  . TONSILLECTOMY AND ADENOIDECTOMY     when pt was 68 years old    FAMILY HISTORY: Family History  Problem Relation Age  of Onset  . Breast cancer Sister   . Breast cancer Paternal Grandmother   . Breast cancer Other     ADVANCED DIRECTIVES (Y/N):  N  HEALTH MAINTENANCE: Social History   Tobacco Use  . Smoking status: Former Smoker    Years: 1.00  . Smokeless tobacco: Current User  Substance Use Topics  . Alcohol use: No  . Drug use: No     Colonoscopy:  PAP:  Bone density:  Lipid panel:  No Known Allergies  Current Outpatient Medications  Medication Sig Dispense Refill  . albuterol (PROVENTIL HFA;VENTOLIN HFA) 108 (90 Base) MCG/ACT inhaler Inhale 2 puffs into the lungs every 4 (four) hours as needed for wheezing or shortness of breath.    Marland Kitchen alendronate (FOSAMAX) 70 MG tablet Take 70 mg by mouth every Wednesday.     Marland Kitchen aspirin 81 MG chewable tablet Chew 81 mg by mouth at bedtime.    Marland Kitchen atorvastatin (LIPITOR) 40 MG tablet Take 40 mg by mouth daily.     . cholecalciferol (VITAMIN D) 1000 units tablet Take 1,000 Units by mouth daily.    Marland Kitchen COLCRYS 0.6 MG tablet Take 0.6 mg by mouth at bedtime.    . ferrous sulfate 325 (65 FE) MG EC tablet Take 1 tablet (325 mg total) by mouth 2 (two) times daily. (Patient taking differently: Take 325 mg by mouth daily. ) 60 tablet 2  . furosemide (LASIX) 20 MG tablet Take 20 mg by mouth every Monday, Wednesday, and Friday.     . gabapentin (NEURONTIN) 300 MG capsule Take 900 mg by mouth 3 (three) times daily.     Marland Kitchen HYDROcodone-acetaminophen (NORCO/VICODIN) 5-325 MG tablet Take 1-2 tablets by mouth every 6 (six) hours as needed for moderate pain or severe pain. 15 tablet 0  . imipramine (TOFRANIL) 50 MG tablet Take 100 mg by mouth at bedtime.     Marland Kitchen lisinopril (PRINIVIL,ZESTRIL) 10 MG tablet Take 10 mg by mouth daily.    Marland Kitchen omeprazole (PRILOSEC) 40 MG capsule Take 40 mg by mouth daily.     Marland Kitchen SPIRIVA HANDIHALER 18 MCG inhalation capsule Place 18 mcg into inhaler and inhale daily.     Marland Kitchen tolterodine (DETROL LA) 4 MG 24 hr capsule Take 4 mg by mouth at bedtime.     No  current facility-administered medications for this visit.     OBJECTIVE: There were no vitals filed for this visit.   There is no height or weight on file to calculate BMI.    ECOG FS:0 - Asymptomatic  General: Well-developed, well-nourished, no acute distress. Eyes: Pink conjunctiva, anicteric sclera. HEENT: Normocephalic, moist mucous membranes, clear oropharnyx. Breast: Patient requested exam be deferred today. Lungs: Clear to auscultation bilaterally. Heart: Regular rate and rhythm. No rubs, murmurs, or gallops. Abdomen: Soft, nontender, nondistended. No organomegaly noted, normoactive bowel sounds. Musculoskeletal: No edema, cyanosis, or clubbing. Neuro: Alert, answering all questions appropriately. Cranial nerves grossly intact. Skin: No rashes or petechiae noted. Psych: Normal affect. Lymphatics: No cervical, calvicular, axillary or inguinal LAD.   LAB RESULTS:  Lab Results  Component Value Date   NA 138 09/17/2019   K 4.0 09/17/2019   CL 103 09/17/2019  CO2 24 09/17/2019   GLUCOSE 89 09/17/2019   BUN 11 09/17/2019   CREATININE 1.04 (H) 09/17/2019   CALCIUM 9.5 09/17/2019   PROT 7.1 07/25/2016   ALBUMIN 3.7 07/25/2016   AST 37 07/25/2016   ALT 21 07/25/2016   ALKPHOS 126 07/25/2016   BILITOT 0.5 07/25/2016   GFRNONAA 56 (L) 09/17/2019   GFRAA >60 09/17/2019    Lab Results  Component Value Date   WBC 4.3 09/17/2019   NEUTROABS 2.8 10/14/2016   HGB 14.1 09/17/2019   HCT 41.6 09/17/2019   MCV 96.7 09/17/2019   PLT 170 09/17/2019     STUDIES: Mm Breast Surgical Specimen  Result Date: 09/23/2019 CLINICAL DATA:  Left breast excision of atypical ductal hyperplasia. EXAM: SPECIMEN RADIOGRAPH OF THE LEFT BREAST COMPARISON:  Previous exam(s). FINDINGS: Status post excision of the left breast. The wire tip and coil shaped biopsy marker clip are present and marked for pathology. IMPRESSION: Specimen radiograph of the left breast. Electronically Signed   By: Claudie Revering M.D.   On: 09/23/2019 13:22   Mm Lt Plc Breast Loc Dev   1st Lesion  Inc Mammo Guide  Result Date: 09/23/2019 CLINICAL DATA:  Recently diagnosed atypical ductal hyperplasia in the upper-outer quadrant of the left breast. EXAM: NEEDLE LOCALIZATION OF THE LEFT BREAST WITH MAMMO GUIDANCE COMPARISON:  Previous exams. FINDINGS: Patient presents for needle localization prior to left breast excision. I met with the patient and we discussed the procedure of needle localization including benefits and alternatives. We discussed the high likelihood of a successful procedure. We discussed the risks of the procedure, including infection, bleeding, tissue injury, and further surgery. Informed, written consent was given. The usual time-out protocol was performed immediately prior to the procedure. Using mammographic guidance, sterile technique, 1% lidocaine and a 9 cm modified Kopans needle, the recently placed coil shaped biopsy marker clip was localized using a lateral approach. The images were marked for Dr. Marlou Starks. IMPRESSION: Needle localization left breast. No apparent complications. Electronically Signed   By: Claudie Revering M.D.   On: 09/23/2019 09:39    ASSESSMENT: Atypical ductal hyperplasia of left breast.  PLAN:    1. Atypical ductal hyperplasia of left breast: Given patient's diagnosis and significant family history with sister with breast cancer in her 41s as well as a paternal grandmother also with breast cancer at an early age, she is at high risk of developing breast cancer in the future and may benefit from 5 years of prophylactic tamoxifen.  She has been instructed to proceed with lumpectomy as planned on September 23, 2019 since a portion of patients with atypical ductal hyperplasia do in fact have DCIS after the entire lesion is removed.  If the DCIS or invasive cancer is found in her final pathology, she will also require referral to radiation oncology.  Patient will return to clinic on October 14, 2019 to discuss her final pathology results and to discuss either treatment or prophylactic measures are needed.  I provided *** minutes of {Blank single:19197::"face-to-face video visit time","non face-to-face telephone visit time"} during this encounter, and > 50% was spent counseling as documented under my assessment & plan.  Patient expressed understanding and was in agreement with this plan. She also understands that She can call clinic at any time with any questions, concerns, or complaints.   Cancer Staging No matching staging information was found for the patient.  Lloyd Huger, MD   10/17/2019 7:35 AM

## 2019-10-21 ENCOUNTER — Inpatient Hospital Stay: Payer: Medicare HMO | Admitting: Oncology

## 2020-06-26 ENCOUNTER — Other Ambulatory Visit: Payer: Self-pay | Admitting: General Surgery

## 2020-06-26 DIAGNOSIS — Z1231 Encounter for screening mammogram for malignant neoplasm of breast: Secondary | ICD-10-CM

## 2020-07-14 ENCOUNTER — Ambulatory Visit
Admission: RE | Admit: 2020-07-14 | Discharge: 2020-07-14 | Disposition: A | Discharge status: Home / Self Care | Payer: Medicare HMO | Source: Ambulatory Visit | Attending: General Surgery | Admitting: General Surgery

## 2020-07-14 ENCOUNTER — Other Ambulatory Visit: Payer: Self-pay

## 2020-07-14 DIAGNOSIS — Z1231 Encounter for screening mammogram for malignant neoplasm of breast: Secondary | ICD-10-CM | POA: Insufficient documentation

## 2020-11-13 IMAGING — MG DIGITAL SCREENING BILATERAL MAMMOGRAM WITH TOMO AND CAD
8 of 14 series · 8 of 40 positions shown · non-contrast
Comparison: Previous exam(s).

CLINICAL DATA: Screening.

EXAM:
DIGITAL SCREENING BILATERAL MAMMOGRAM WITH TOMO AND CAD

[R MLO synth-2D (1 of 2)]
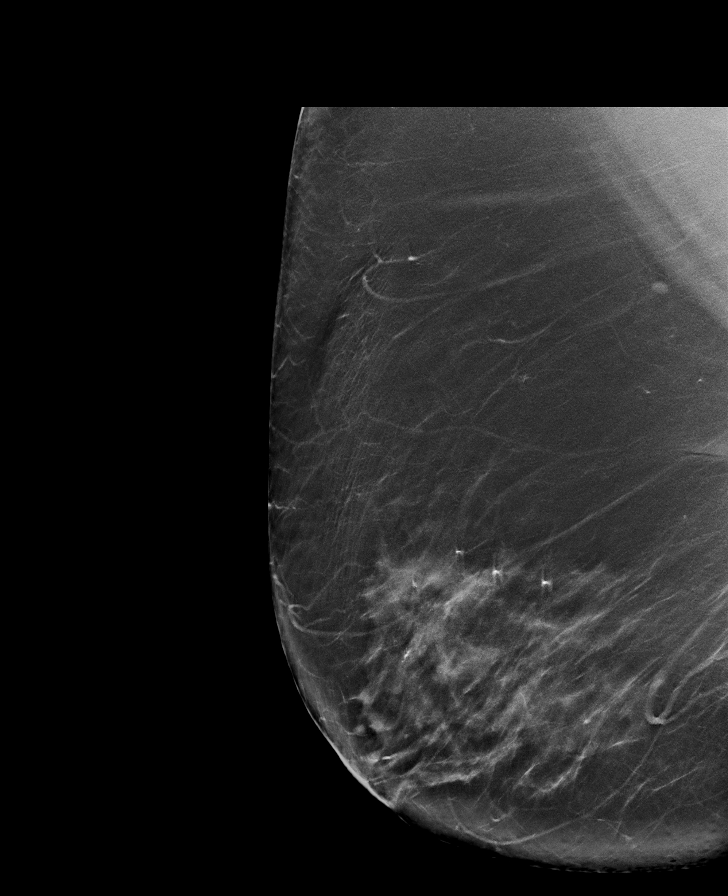

[L CC synth-2D (1 of 2)]
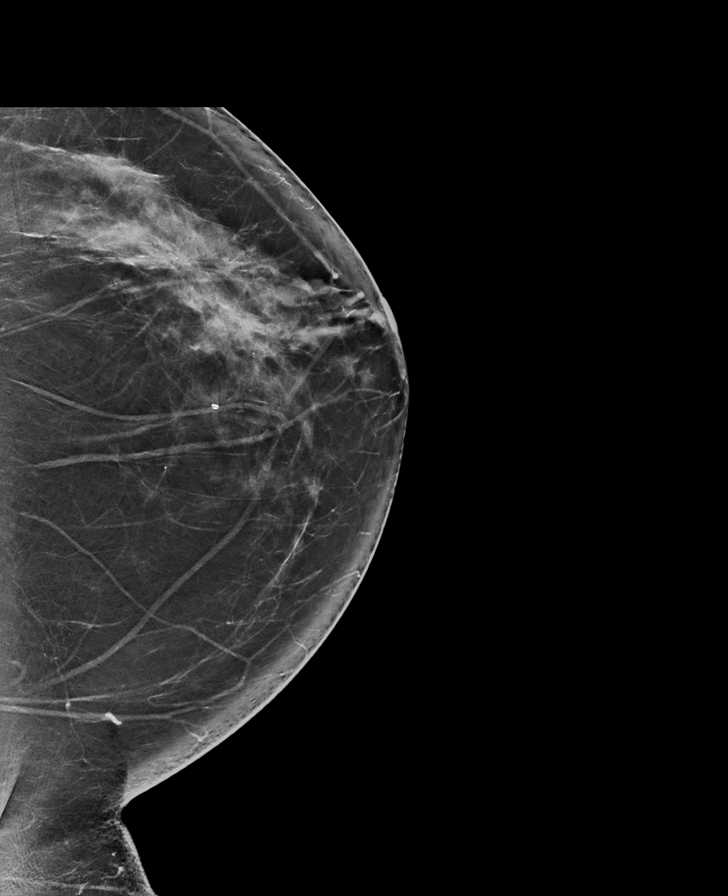

[L CC synth-2D (2 of 2)]
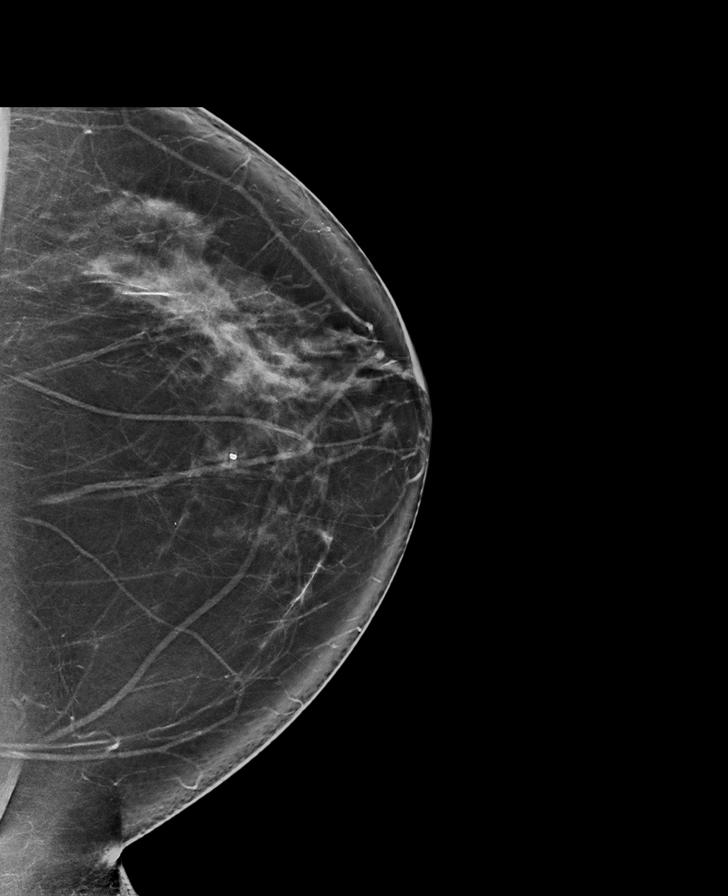

[R CV synth-2D]
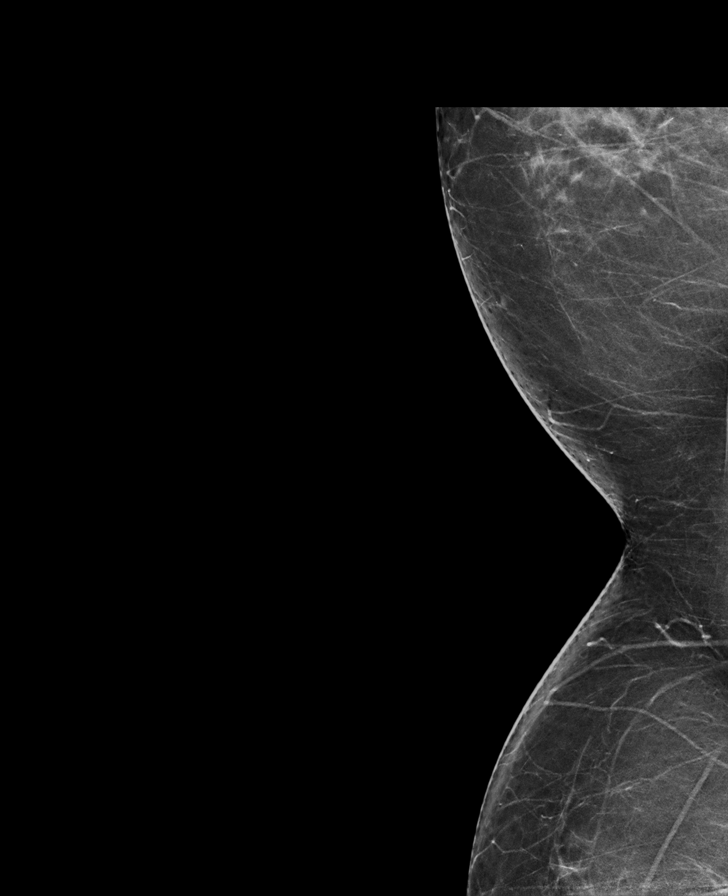

[L MLO synth-2D]
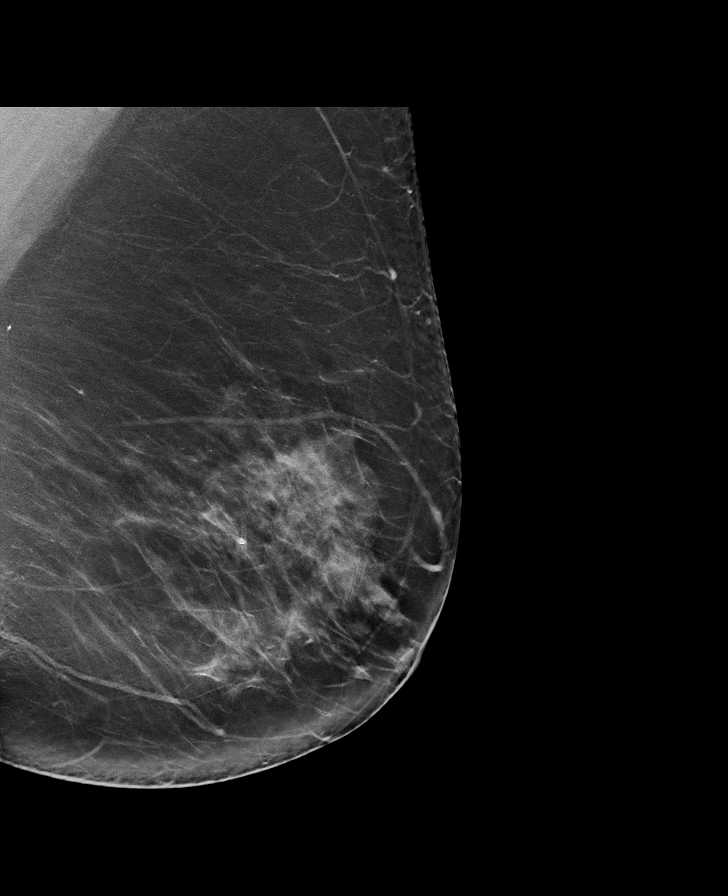

[R MLO synth-2D (2 of 2)]
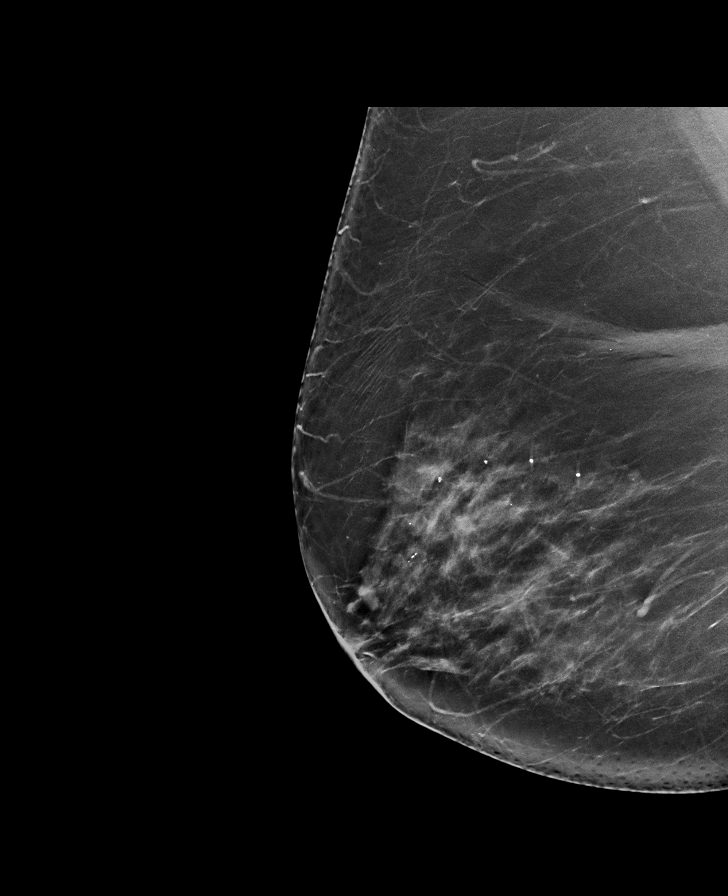

[R CC synth-2D]
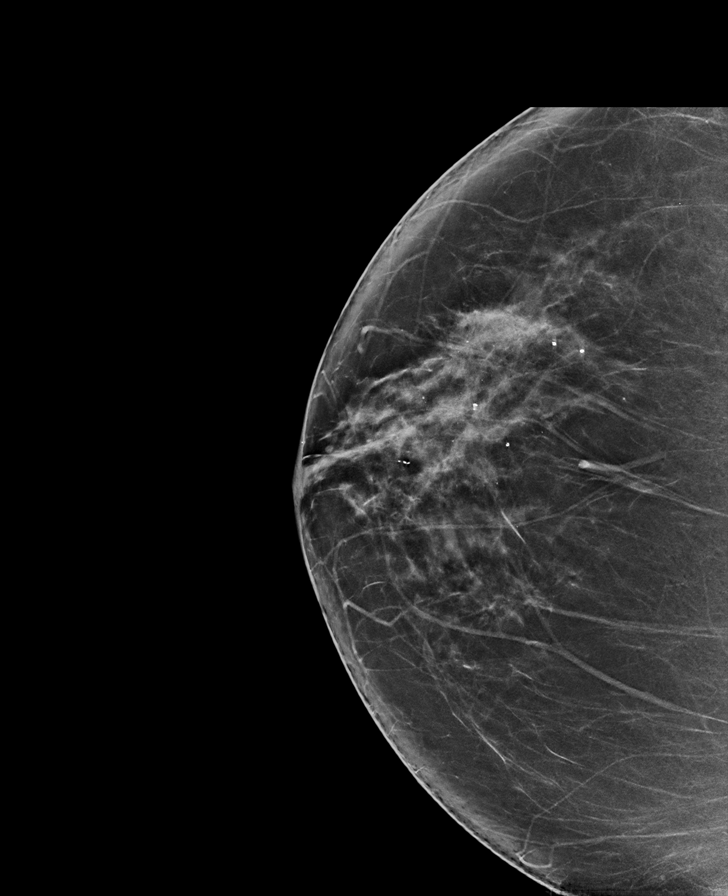

[L MLO tomo · tomo slice 46/91.0]
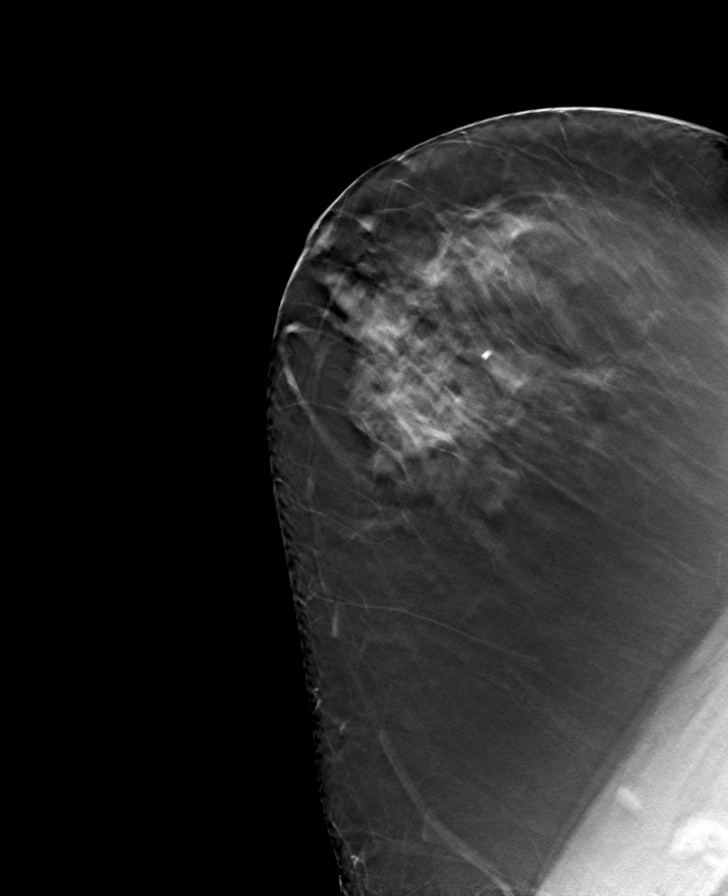

[8 of 40 positions shown; findings below may reference images not displayed]

ACR Breast Density Category c: The breast tissue is heterogeneously
dense, which may obscure small masses.
FINDINGS: In the left breast, possible distortion warrants further evaluation.
In the right breast, no findings suspicious for malignancy. Images
were processed with CAD.
IMPRESSION: Further evaluation is suggested for possible distortion in the left
breast.

RECOMMENDATION:
Diagnostic mammogram and possibly ultrasound of the left breast.
(Code:J9-G-HHO)

The patient will be contacted regarding the findings, and additional
imaging will be scheduled.

BI-RADS CATEGORY  0: Incomplete. Need additional imaging evaluation
and/or prior mammograms for comparison.

## 2021-06-11 ENCOUNTER — Other Ambulatory Visit: Payer: Self-pay | Admitting: Family Medicine

## 2021-06-11 DIAGNOSIS — Z1231 Encounter for screening mammogram for malignant neoplasm of breast: Secondary | ICD-10-CM

## 2021-07-17 ENCOUNTER — Other Ambulatory Visit: Payer: Self-pay

## 2021-07-17 ENCOUNTER — Ambulatory Visit
Admission: RE | Admit: 2021-07-17 | Discharge: 2021-07-17 | Disposition: A | Discharge status: Home / Self Care | Payer: Medicare HMO | Source: Ambulatory Visit | Attending: Family Medicine | Admitting: Family Medicine

## 2021-07-17 DIAGNOSIS — Z1231 Encounter for screening mammogram for malignant neoplasm of breast: Secondary | ICD-10-CM | POA: Diagnosis not present

## 2021-07-20 ENCOUNTER — Other Ambulatory Visit: Payer: Self-pay | Admitting: Family Medicine

## 2021-07-20 DIAGNOSIS — R928 Other abnormal and inconclusive findings on diagnostic imaging of breast: Secondary | ICD-10-CM

## 2021-07-25 ENCOUNTER — Ambulatory Visit
Admission: RE | Admit: 2021-07-25 | Discharge: 2021-07-25 | Disposition: A | Discharge status: Home / Self Care | Payer: Medicare HMO | Source: Ambulatory Visit | Attending: Family Medicine | Admitting: Family Medicine

## 2021-07-25 ENCOUNTER — Other Ambulatory Visit: Payer: Self-pay

## 2021-07-25 DIAGNOSIS — R928 Other abnormal and inconclusive findings on diagnostic imaging of breast: Secondary | ICD-10-CM

## 2022-05-03 ENCOUNTER — Emergency Department: Payer: Medicare HMO

## 2022-05-03 ENCOUNTER — Inpatient Hospital Stay
Admission: EM | Admit: 2022-05-03 | Discharge: 2022-05-07 | DRG: 871 | Disposition: A | Discharge status: Skilled Nursing Facility | Payer: Medicare HMO | Attending: Internal Medicine | Admitting: Internal Medicine

## 2022-05-03 DIAGNOSIS — G9340 Encephalopathy, unspecified: Secondary | ICD-10-CM

## 2022-05-03 DIAGNOSIS — M109 Gout, unspecified: Secondary | ICD-10-CM | POA: Diagnosis present

## 2022-05-03 DIAGNOSIS — Z7982 Long term (current) use of aspirin: Secondary | ICD-10-CM

## 2022-05-03 DIAGNOSIS — I714 Abdominal aortic aneurysm, without rupture, unspecified: Secondary | ICD-10-CM | POA: Diagnosis present

## 2022-05-03 DIAGNOSIS — E871 Hypo-osmolality and hyponatremia: Secondary | ICD-10-CM | POA: Diagnosis present

## 2022-05-03 DIAGNOSIS — Z6841 Body Mass Index (BMI) 40.0 and over, adult: Secondary | ICD-10-CM

## 2022-05-03 DIAGNOSIS — A4151 Sepsis due to Escherichia coli [E. coli]: Secondary | ICD-10-CM | POA: Diagnosis not present

## 2022-05-03 DIAGNOSIS — G9341 Metabolic encephalopathy: Secondary | ICD-10-CM | POA: Diagnosis present

## 2022-05-03 DIAGNOSIS — N10 Acute tubulo-interstitial nephritis: Secondary | ICD-10-CM

## 2022-05-03 DIAGNOSIS — E872 Acidosis, unspecified: Secondary | ICD-10-CM | POA: Diagnosis present

## 2022-05-03 DIAGNOSIS — Z7983 Long term (current) use of bisphosphonates: Secondary | ICD-10-CM

## 2022-05-03 DIAGNOSIS — R6520 Severe sepsis without septic shock: Secondary | ICD-10-CM | POA: Diagnosis present

## 2022-05-03 DIAGNOSIS — K7460 Unspecified cirrhosis of liver: Secondary | ICD-10-CM | POA: Diagnosis present

## 2022-05-03 DIAGNOSIS — D6959 Other secondary thrombocytopenia: Secondary | ICD-10-CM | POA: Diagnosis present

## 2022-05-03 DIAGNOSIS — J449 Chronic obstructive pulmonary disease, unspecified: Secondary | ICD-10-CM | POA: Diagnosis present

## 2022-05-03 DIAGNOSIS — G4733 Obstructive sleep apnea (adult) (pediatric): Secondary | ICD-10-CM | POA: Diagnosis present

## 2022-05-03 DIAGNOSIS — E8770 Fluid overload, unspecified: Secondary | ICD-10-CM | POA: Diagnosis present

## 2022-05-03 DIAGNOSIS — E876 Hypokalemia: Secondary | ICD-10-CM | POA: Diagnosis present

## 2022-05-03 DIAGNOSIS — N12 Tubulo-interstitial nephritis, not specified as acute or chronic: Secondary | ICD-10-CM

## 2022-05-03 DIAGNOSIS — Z7951 Long term (current) use of inhaled steroids: Secondary | ICD-10-CM

## 2022-05-03 DIAGNOSIS — N182 Chronic kidney disease, stage 2 (mild): Secondary | ICD-10-CM | POA: Diagnosis present

## 2022-05-03 DIAGNOSIS — G252 Other specified forms of tremor: Secondary | ICD-10-CM | POA: Diagnosis present

## 2022-05-03 DIAGNOSIS — R32 Unspecified urinary incontinence: Secondary | ICD-10-CM | POA: Diagnosis present

## 2022-05-03 DIAGNOSIS — K7682 Hepatic encephalopathy: Secondary | ICD-10-CM | POA: Diagnosis present

## 2022-05-03 DIAGNOSIS — A419 Sepsis, unspecified organism: Secondary | ICD-10-CM | POA: Diagnosis not present

## 2022-05-03 DIAGNOSIS — I959 Hypotension, unspecified: Secondary | ICD-10-CM | POA: Diagnosis not present

## 2022-05-03 DIAGNOSIS — Z87440 Personal history of urinary (tract) infections: Secondary | ICD-10-CM

## 2022-05-03 DIAGNOSIS — F419 Anxiety disorder, unspecified: Secondary | ICD-10-CM | POA: Diagnosis present

## 2022-05-03 DIAGNOSIS — E869 Volume depletion, unspecified: Secondary | ICD-10-CM | POA: Diagnosis present

## 2022-05-03 DIAGNOSIS — Z96651 Presence of right artificial knee joint: Secondary | ICD-10-CM | POA: Diagnosis present

## 2022-05-03 DIAGNOSIS — J9611 Chronic respiratory failure with hypoxia: Secondary | ICD-10-CM | POA: Diagnosis present

## 2022-05-03 DIAGNOSIS — F32A Depression, unspecified: Secondary | ICD-10-CM | POA: Diagnosis present

## 2022-05-03 DIAGNOSIS — Z79899 Other long term (current) drug therapy: Secondary | ICD-10-CM

## 2022-05-03 DIAGNOSIS — I129 Hypertensive chronic kidney disease with stage 1 through stage 4 chronic kidney disease, or unspecified chronic kidney disease: Secondary | ICD-10-CM | POA: Diagnosis present

## 2022-05-03 DIAGNOSIS — E8809 Other disorders of plasma-protein metabolism, not elsewhere classified: Secondary | ICD-10-CM | POA: Diagnosis present

## 2022-05-03 DIAGNOSIS — K7581 Nonalcoholic steatohepatitis (NASH): Secondary | ICD-10-CM | POA: Diagnosis present

## 2022-05-03 LAB — BLOOD GAS, VENOUS
Acid-Base Excess: 1.5 mmol/L (ref 0.0–2.0)
Bicarbonate: 25.9 mmol/L (ref 20.0–28.0)
Drawn by: 6479
O2 Saturation: 62.7 %
Patient temperature: 37
pCO2, Ven: 39 mmHg — ABNORMAL LOW (ref 44–60)
pH, Ven: 7.43 (ref 7.25–7.43)
pO2, Ven: 38 mmHg (ref 32–45)

## 2022-05-03 LAB — URINALYSIS, COMPLETE (UACMP) WITH MICROSCOPIC
Bilirubin Urine: NEGATIVE
Glucose, UA: NEGATIVE mg/dL
Ketones, ur: NEGATIVE mg/dL
Nitrite: NEGATIVE
Protein, ur: NEGATIVE mg/dL
Specific Gravity, Urine: 1.012 (ref 1.005–1.030)
pH: 8 (ref 5.0–8.0)

## 2022-05-03 LAB — COMPREHENSIVE METABOLIC PANEL
ALT: 26 U/L (ref 0–44)
AST: 58 U/L — ABNORMAL HIGH (ref 15–41)
Albumin: 2.9 g/dL — ABNORMAL LOW (ref 3.5–5.0)
Alkaline Phosphatase: 104 U/L (ref 38–126)
Anion gap: 9 (ref 5–15)
BUN: 11 mg/dL (ref 8–23)
CO2: 22 mmol/L (ref 22–32)
Calcium: 9.2 mg/dL (ref 8.9–10.3)
Chloride: 107 mmol/L (ref 98–111)
Creatinine, Ser: 1.22 mg/dL — ABNORMAL HIGH (ref 0.44–1.00)
GFR, Estimated: 48 mL/min — ABNORMAL LOW (ref >60)
Glucose, Bld: 103 mg/dL — ABNORMAL HIGH (ref 70–99)
Potassium: 3.4 mmol/L — ABNORMAL LOW (ref 3.5–5.1)
Sodium: 138 mmol/L (ref 135–145)
Total Bilirubin: 1.3 mg/dL — ABNORMAL HIGH (ref 0.3–1.2)
Total Protein: 6.3 g/dL — ABNORMAL LOW (ref 6.5–8.1)

## 2022-05-03 LAB — CBC WITH DIFFERENTIAL/PLATELET
Abs Immature Granulocytes: 0.03 10*3/uL (ref 0.00–0.07)
Basophils Absolute: 0 10*3/uL (ref 0.0–0.1)
Basophils Relative: 0 %
Eosinophils Absolute: 0 10*3/uL (ref 0.0–0.5)
Eosinophils Relative: 0 %
HCT: 36.5 % (ref 36.0–46.0)
Hemoglobin: 11.6 g/dL — ABNORMAL LOW (ref 12.0–15.0)
Immature Granulocytes: 1 %
Lymphocytes Relative: 8 %
Lymphs Abs: 0.4 10*3/uL — ABNORMAL LOW (ref 0.7–4.0)
MCH: 30.9 pg (ref 26.0–34.0)
MCHC: 31.8 g/dL (ref 30.0–36.0)
MCV: 97.1 fL (ref 80.0–100.0)
Monocytes Absolute: 0.6 10*3/uL (ref 0.1–1.0)
Monocytes Relative: 12 %
Neutro Abs: 3.8 10*3/uL (ref 1.7–7.7)
Neutrophils Relative %: 79 %
Platelets: 111 10*3/uL — ABNORMAL LOW (ref 150–400)
RBC: 3.76 MIL/uL — ABNORMAL LOW (ref 3.87–5.11)
RDW: 15.5 % (ref 11.5–15.5)
WBC: 4.8 10*3/uL (ref 4.0–10.5)
nRBC: 0 % (ref 0.0–0.2)

## 2022-05-03 LAB — PROTIME-INR
INR: 1.3 — ABNORMAL HIGH (ref 0.8–1.2)
Prothrombin Time: 16.4 seconds — ABNORMAL HIGH (ref 11.4–15.2)

## 2022-05-03 LAB — LACTIC ACID, PLASMA
Lactic Acid, Venous: 3.5 mmol/L (ref 0.5–1.9)
Lactic Acid, Venous: 1.9 mmol/L (ref 0.5–1.9)

## 2022-05-03 LAB — TROPONIN I (HIGH SENSITIVITY): Troponin I (High Sensitivity): 8 ng/L (ref <18)

## 2022-05-03 LAB — APTT: aPTT: 32 seconds (ref 24–36)

## 2022-05-03 LAB — BRAIN NATRIURETIC PEPTIDE: B Natriuretic Peptide: 65.2 pg/mL (ref 0.0–100.0)

## 2022-05-03 LAB — PROCALCITONIN: Procalcitonin: <0.1 ng/mL

## 2022-05-03 LAB — AMMONIA: Ammonia: 67 umol/L — ABNORMAL HIGH (ref 9–35)

## 2022-05-03 LAB — TSH: TSH: 0.563 u[IU]/mL (ref 0.350–4.500)

## 2022-05-03 LAB — MAGNESIUM: Magnesium: 1.6 mg/dL — ABNORMAL LOW (ref 1.7–2.4)

## 2022-05-03 MED ORDER — IOHEXOL 350 MG/ML SOLN
100.0000 mL | Freq: Once | INTRAVENOUS | Status: AC | PRN
  Administered 2022-05-03: 100 mL via INTRAVENOUS

## 2022-05-03 MED ORDER — COLCHICINE 0.6 MG PO TABS
0.6000 mg | ORAL_TABLET | Freq: Every day | ORAL | Status: DC
  Administered 2022-05-03 – 2022-05-04 (×2): 0.6 mg via ORAL
  Filled 2022-05-03 (×3): qty 1

## 2022-05-03 MED ORDER — HYDROCODONE-ACETAMINOPHEN 5-325 MG PO TABS
1.0000 | ORAL_TABLET | Freq: Four times a day (QID) | ORAL | Status: DC | PRN
  Administered 2022-05-05: 1 via ORAL
  Filled 2022-05-03: qty 1

## 2022-05-03 MED ORDER — ALBUTEROL SULFATE (2.5 MG/3ML) 0.083% IN NEBU
3.0000 mL | INHALATION_SOLUTION | RESPIRATORY_TRACT | Status: DC | PRN
Start: 2022-05-03 — End: 2022-05-07

## 2022-05-03 MED ORDER — TIOTROPIUM BROMIDE MONOHYDRATE 18 MCG IN CAPS
18.0000 ug | ORAL_CAPSULE | Freq: Every day | RESPIRATORY_TRACT | Status: DC
Start: 2022-05-04 — End: 2022-05-06
  Administered 2022-05-04 – 2022-05-06 (×3): 18 ug via RESPIRATORY_TRACT
  Filled 2022-05-03: qty 5

## 2022-05-03 MED ORDER — GABAPENTIN 300 MG PO CAPS
900.0000 mg | ORAL_CAPSULE | Freq: Three times a day (TID) | ORAL | Status: DC
  Administered 2022-05-03 – 2022-05-06 (×9): 900 mg via ORAL
  Filled 2022-05-03 (×9): qty 3

## 2022-05-03 MED ORDER — ONDANSETRON HCL 4 MG/2ML IJ SOLN
4.0000 mg | Freq: Once | INTRAMUSCULAR | Status: AC
  Administered 2022-05-03: 4 mg via INTRAVENOUS
  Filled 2022-05-03: qty 2

## 2022-05-03 MED ORDER — SODIUM CHLORIDE 0.9 % IV SOLN
1.0000 g | INTRAVENOUS | Status: DC
  Administered 2022-05-04 – 2022-05-05 (×2): 1 g via INTRAVENOUS
  Filled 2022-05-03: qty 1
  Filled 2022-05-03 (×2): qty 10

## 2022-05-03 MED ORDER — ALBUMIN HUMAN 25 % IV SOLN
25.0000 g | Freq: Four times a day (QID) | INTRAVENOUS | Status: AC
  Administered 2022-05-03 – 2022-05-04 (×3): 25 g via INTRAVENOUS
  Filled 2022-05-03 (×3): qty 100

## 2022-05-03 MED ORDER — IMIPRAMINE HCL 50 MG PO TABS
100.0000 mg | ORAL_TABLET | Freq: Every day | ORAL | Status: DC
  Administered 2022-05-03 – 2022-05-04 (×2): 100 mg via ORAL
  Filled 2022-05-03 (×3): qty 2

## 2022-05-03 MED ORDER — ACETAMINOPHEN 500 MG PO TABS
1000.0000 mg | ORAL_TABLET | Freq: Once | ORAL | Status: AC
  Administered 2022-05-03: 1000 mg via ORAL
  Filled 2022-05-03: qty 2

## 2022-05-03 MED ORDER — ENOXAPARIN SODIUM 80 MG/0.8ML IJ SOSY
0.5000 mg/kg | PREFILLED_SYRINGE | INTRAMUSCULAR | Status: DC
  Administered 2022-05-03: 75 mg via SUBCUTANEOUS
  Filled 2022-05-03 (×2): qty 0.75

## 2022-05-03 MED ORDER — ASPIRIN 81 MG PO CHEW
81.0000 mg | CHEWABLE_TABLET | Freq: Every day | ORAL | Status: DC
  Administered 2022-05-03 – 2022-05-06 (×4): 81 mg via ORAL
  Filled 2022-05-03 (×4): qty 1

## 2022-05-03 MED ORDER — PANTOPRAZOLE SODIUM 40 MG PO TBEC
40.0000 mg | DELAYED_RELEASE_TABLET | Freq: Every day | ORAL | Status: DC
  Administered 2022-05-03 – 2022-05-07 (×5): 40 mg via ORAL
  Filled 2022-05-03 (×5): qty 1

## 2022-05-03 MED ORDER — VITAMIN D 25 MCG (1000 UNIT) PO TABS
1000.0000 [IU] | ORAL_TABLET | Freq: Every day | ORAL | Status: DC
  Administered 2022-05-04 – 2022-05-07 (×4): 1000 [IU] via ORAL
  Filled 2022-05-03 (×4): qty 1

## 2022-05-03 MED ORDER — SODIUM CHLORIDE 0.9 % IV SOLN
1.0000 g | Freq: Once | INTRAVENOUS | Status: AC
  Administered 2022-05-03: 1 g via INTRAVENOUS
  Filled 2022-05-03: qty 10

## 2022-05-03 MED ORDER — MAGNESIUM SULFATE 2 GM/50ML IV SOLN
2.0000 g | Freq: Once | INTRAVENOUS | Status: AC
  Administered 2022-05-03: 2 g via INTRAVENOUS
  Filled 2022-05-03: qty 50

## 2022-05-03 MED ORDER — LACTATED RINGERS IV BOLUS
500.0000 mL | Freq: Once | INTRAVENOUS | Status: AC
  Administered 2022-05-03: 500 mL via INTRAVENOUS

## 2022-05-03 MED ORDER — LISINOPRIL 10 MG PO TABS
10.0000 mg | ORAL_TABLET | Freq: Every day | ORAL | Status: DC
  Administered 2022-05-03 – 2022-05-05 (×3): 10 mg via ORAL
  Filled 2022-05-03 (×3): qty 1

## 2022-05-03 MED ORDER — ENOXAPARIN SODIUM 40 MG/0.4ML IJ SOSY: 40.0000 mg | PREFILLED_SYRINGE | INTRAMUSCULAR | Status: DC

## 2022-05-03 MED ORDER — FESOTERODINE FUMARATE ER 4 MG PO TB24
4.0000 mg | ORAL_TABLET | Freq: Every day | ORAL | Status: DC
  Administered 2022-05-03 – 2022-05-07 (×5): 4 mg via ORAL
  Filled 2022-05-03 (×6): qty 1

## 2022-05-03 MED ORDER — POTASSIUM CHLORIDE CRYS ER 20 MEQ PO TBCR
40.0000 meq | EXTENDED_RELEASE_TABLET | Freq: Once | ORAL | Status: AC
  Administered 2022-05-03: 40 meq via ORAL
  Filled 2022-05-03: qty 2

## 2022-05-03 MED ORDER — FERROUS SULFATE 325 (65 FE) MG PO TABS
325.0000 mg | ORAL_TABLET | Freq: Every day | ORAL | Status: DC
  Administered 2022-05-04 – 2022-05-07 (×4): 325 mg via ORAL
  Filled 2022-05-03 (×4): qty 1

## 2022-05-03 MED ORDER — ATORVASTATIN CALCIUM 20 MG PO TABS
40.0000 mg | ORAL_TABLET | Freq: Every day | ORAL | Status: DC
  Administered 2022-05-03 – 2022-05-07 (×5): 40 mg via ORAL
  Filled 2022-05-03 (×5): qty 2

## 2022-05-03 MED ORDER — LACTATED RINGERS IV BOLUS
1000.0000 mL | Freq: Once | INTRAVENOUS | Status: AC
  Administered 2022-05-03: 1000 mL via INTRAVENOUS

## 2022-05-03 NOTE — Progress Notes (Signed)
[facility] is following sepsis bundle. 

## 2022-05-03 NOTE — H&P (Signed)
History and Physical    Carla Huerta WJX:[id number] DOB: 06-23-1951 DOA: 05/03/2022  PCP: Hillery Aldo, MD (Confirm with patient/family/NH records and if not entered, this has to be entered at [facility] point of entry) Patient coming from: Home  I have personally briefly reviewed patient's old medical records in [facility]  Chief Complaint: Feeling sleepy  HPI: Carla Huerta is a 71 y.o. female with medical history significant of COPD, HTN, cirrhosis secondary to NASH, CKD stage II, anxiety/depression, OSA on CPAP at bedtime, recurrent UTIs, presented with mentation changes.  Sleepy and confused, most history provided by patient's husband over the phone.  Husband reported that the patient became incontinent of urine for the last 2 to 3 days with urinary frequency's.  But patient denied any fever chills or back pain yet.  This morning, patient became very confused, and sleepy.  Husband reported the patient has similar episode before with urinary incontinence and each episode turned out to be a UTI.  And he suspect this time the patient had another episode of UTI.  ED Course: Afebrile, nonhypotensive no tachycardia.  CT head negative for acute findings.  CT abdomen pelvis showed possible early signs of right-sided pyelonephritis.  WBC 4.8, ABG 7.4 3/39/38, lactic acid 0.5 improved to 5.9 after IV boluses.  UA showed WBC 6-10, patient was given 1 dose of IV ceftriaxone  Review of Systems: Unable to perform, patient confused.  Past Medical History:  Diagnosis Date   Anemia    COPD (chronic obstructive pulmonary disease) (HCC)    Cough    Depression    Diaphragm paralysis    Gout    Hypertension    Hyponatremia    Insomnia    Liver disease    Nonalcoholic hepatosteatosis    PONV (postoperative nausea and vomiting)    Renal disorder    Sleep apnea    Splenic laceration    Vertigo     Past Surgical History:  Procedure Laterality Date   APPENDECTOMY     when she was 42  yearsa old   BREAST BIOPSY Left 06/17/2019   affirm bx distortion with calcs, coil clip, ADH   BREAST EXCISIONAL BIOPSY Left 09/23/2019   neg   BREAST LUMPECTOMY Left 09/23/2019   ADH   BREAST LUMPECTOMY WITH NEEDLE LOCALIZATION Left 09/23/2019   Procedure: LEFT BREAST LUMPECTOMY WITH NEEDLE LOCALIZATION;  Surgeon: Griselda Miner, MD;  Location: [facility] ORS;  Service: General;  Laterality: Left;   COLONOSCOPY WITH PROPOFOL N/A 10/01/2016   Procedure: COLONOSCOPY WITH PROPOFOL;  Surgeon: Midge Minium, MD;  Location: [facility] ENDOSCOPY;  Service: Endoscopy;  Laterality: N/A;   ESOPHAGOGASTRODUODENOSCOPY (EGD) WITH PROPOFOL N/A 10/01/2016   Procedure: ESOPHAGOGASTRODUODENOSCOPY (EGD) WITH PROPOFOL;  Surgeon: Midge Minium, MD;  Location: [facility] ENDOSCOPY;  Service: Endoscopy;  Laterality: N/A;   LIVER BIOPSY     2015   REPLACEMENT TOTAL KNEE Right 2002   TONSILLECTOMY AND ADENOIDECTOMY     when pt was 71 years old     reports that she has quit smoking. She uses smokeless tobacco. She reports that she does not drink alcohol and does not use drugs.  No Known Allergies  Family History  Problem Relation Age of Onset   Breast cancer Sister    Breast cancer Paternal Grandmother    Breast cancer Other      Prior to Admission medications   Medication Sig Start Date End Date Taking? Authorizing Provider  albuterol (PROVENTIL HFA;VENTOLIN HFA) 108 (90 Base) MCG/ACT  inhaler Inhale 2 puffs into the lungs every 4 (four) hours as needed for wheezing or shortness of breath.    [provider]  alendronate (FOSAMAX) 70 MG tablet Take 70 mg by mouth every Wednesday.  08/12/19   [provider]  aspirin 81 MG chewable tablet Chew 81 mg by mouth at bedtime.    [provider]  atorvastatin (LIPITOR) 40 MG tablet Take 40 mg by mouth daily.     [provider]  cholecalciferol (VITAMIN D) 1000 units tablet Take 1,000 Units by mouth daily.    [provider]  COLCRYS 0.6  MG tablet Take 0.6 mg by mouth at bedtime.    [provider]  ferrous sulfate 325 (65 FE) MG EC tablet Take 1 tablet (325 mg total) by mouth 2 (two) times daily. Patient taking differently: Take 325 mg by mouth daily.  07/26/16   Houston Siren, MD  furosemide (LASIX) 20 MG tablet Take 20 mg by mouth every Monday, Wednesday, and Friday.     [provider]  gabapentin (NEURONTIN) 300 MG capsule Take 900 mg by mouth 3 (three) times daily.     [provider]  HYDROcodone-acetaminophen (NORCO/VICODIN) 5-325 MG tablet Take 1-2 tablets by mouth every 6 (six) hours as needed for moderate pain or severe pain. 09/23/19   Chevis Pretty III, MD  imipramine (TOFRANIL) 50 MG tablet Take 100 mg by mouth at bedtime.  08/09/16   [provider]  lisinopril (PRINIVIL,ZESTRIL) 10 MG tablet Take 10 mg by mouth daily.    [provider]  omeprazole (PRILOSEC) 40 MG capsule Take 40 mg by mouth daily.     [provider]  SPIRIVA HANDIHALER 18 MCG inhalation capsule Place 18 mcg into inhaler and inhale daily.  05/20/19   [provider]  tolterodine (DETROL LA) 4 MG 24 hr capsule Take 4 mg by mouth at bedtime.    [provider]    Physical Exam: Vitals:   05/03/22 1234 05/03/22 1235 05/03/22 1239 05/03/22 1400  BP:   131/63 (!) 155/83  Pulse: (!) 112   (!) 106  Resp: (!) 22   20  Temp:   99 F (37.2 C)   TempSrc:   Oral   SpO2:   95% 95%  Weight:  (!) 149.7 kg    Height:  5\' 5"  (1.651 m)      Constitutional: NAD, calm, comfortable Vitals:   05/03/22 1234 05/03/22 1235 05/03/22 1239 05/03/22 1400  BP:   131/63 (!) 155/83  Pulse: (!) 112   (!) 106  Resp: (!) 22   20  Temp:   99 F (37.2 C)   TempSrc:   Oral   SpO2:   95% 95%  Weight:  (!) 149.7 kg    Height:  5\' 5"  (1.651 m)     Eyes: PERRL, lids and conjunctivae normal ENMT: Mucous membranes are dry. Posterior pharynx clear of any exudate or lesions.Normal dentition.  Neck:  normal, supple, no masses, no thyromegaly Respiratory: clear to auscultation bilaterally, no wheezing, no crackles. Normal respiratory effort. No accessory muscle use.  Cardiovascular: Regular rate and rhythm, no murmurs / rubs / gallops. 1+ extremity edema. 2+ pedal pulses. No carotid bruits.  Abdomen: no tenderness, no masses palpated. No hepatosplenomegaly. Bowel sounds positive.  Musculoskeletal: no clubbing / cyanosis. No joint deformity upper and lower extremities. Good ROM, no contractures. Normal muscle tone.  Skin: no rashes, lesions, ulcers. No induration Neurologic: No facial  droops, moving all limbs, following simple commands Psychiatric: Lethargic, opens eyes spontaneously, calm.  Orientated to person and place, confused about time   Labs on Admission: I have personally reviewed following labs and imaging studies  CBC: Recent Labs  Lab 05/03/22 1246  WBC 4.8  NEUTROABS 3.8  HGB 11.6*  HCT 36.5  MCV 97.1  PLT 111*   Basic Metabolic Panel: Recent Labs  Lab 05/03/22 1246  NA 138  K 3.4*  CL 107  CO2 22  GLUCOSE 103*  BUN 11  CREATININE 1.22*  CALCIUM 9.2  MG 1.6*   GFR: Estimated Creatinine Clearance: 64.7 mL/min (A) (by C-G formula based on SCr of 1.22 mg/dL (H)). Liver Function Tests: Recent Labs  Lab 05/03/22 1246  AST 58*  ALT 26  ALKPHOS 104  BILITOT 1.3*  PROT 6.3*  ALBUMIN 2.9*   No results for input(s): "LIPASE", "AMYLASE" in the last 168 hours. No results for input(s): "AMMONIA" in the last 168 hours. Coagulation Profile: Recent Labs  Lab 05/03/22 1246  INR 1.3*   Cardiac Enzymes: No results for input(s): "CKTOTAL", "CKMB", "CKMBINDEX", "TROPONINI" in the last 168 hours. BNP (last 3 results) No results for input(s): "PROBNP" in the last 8760 hours. HbA1C: No results for input(s): "HGBA1C" in the last 72 hours. CBG: No results for input(s): "GLUCAP" in the last 168 hours. Lipid Profile: No results for input(s): "CHOL", "HDL",  "LDLCALC", "TRIG", "CHOLHDL", "LDLDIRECT" in the last 72 hours. Thyroid Function Tests: Recent Labs    05/03/22 1246  TSH 0.563   Anemia Panel: No results for input(s): "VITAMINB12", "FOLATE", "FERRITIN", "TIBC", "IRON", "RETICCTPCT" in the last 72 hours. Urine analysis:    Component Value Date/Time   COLORURINE YELLOW (A) 05/03/2022 1440   APPEARANCEUR CLEAR (A) 05/03/2022 1440   APPEARANCEUR Clear 04/24/2012 2129   LABSPEC 1.012 05/03/2022 1440   LABSPEC 1.012 04/24/2012 2129   PHURINE 8.0 05/03/2022 1440   GLUCOSEU NEGATIVE 05/03/2022 1440   GLUCOSEU Negative 04/24/2012 2129   HGBUR SMALL (A) 05/03/2022 1440   BILIRUBINUR NEGATIVE 05/03/2022 1440   BILIRUBINUR Negative 04/24/2012 2129   KETONESUR NEGATIVE 05/03/2022 1440   PROTEINUR NEGATIVE 05/03/2022 1440   NITRITE NEGATIVE 05/03/2022 1440   LEUKOCYTESUR SMALL (A) 05/03/2022 1440   LEUKOCYTESUR Negative 04/24/2012 2129    Radiological Exams on Admission: CT ABDOMEN PELVIS W CONTRAST  Result Date: 05/03/2022 CLINICAL DATA:  Incoherent, positive sepsis screening, strong smell of urine, history frequent UTI, COPD, hypertension EXAM: CT ANGIOGRAPHY CHEST CT ABDOMEN AND PELVIS WITH CONTRAST TECHNIQUE: Multidetector CT imaging of the chest was performed using the standard protocol during bolus administration of intravenous contrast. Multiplanar CT image reconstructions and MIPs were obtained to evaluate the vascular anatomy. Multidetector CT imaging of the abdomen and pelvis was performed using the standard protocol during bolus administration of intravenous contrast. RADIATION DOSE REDUCTION: This exam was performed according to the departmental dose-optimization program which includes automated exposure control, adjustment of the mA and/or kV according to patient size and/or use of iterative reconstruction technique. CONTRAST:  OMNIPAQUE IOHEXOL 350 MG/ML SOLN IV. No oral contrast. COMPARISON:  CT chest 09/13/2010, CT abdomen  and pelvis 05/01/2009 FINDINGS: CTA CHEST FINDINGS Cardiovascular: Atherosclerotic calcifications aorta, proximal great vessels, and coronary arteries. 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 No evidence of aortic dissection. Enlargement of cardiac chambers. No pericardial effusion. Pulmonary arteries adequately opacified the scattered beam hardening artifacts are present. No definite evidence of pulmonary embolism. Mediastinum/Nodes: Base of cervical region  normal appearance. Esophagus unremarkable. No thoracic adenopathy. Lungs/Pleura: Decreased lung volumes. Paraseptal emphysema. Scattered subpleural atelectasis. Minimal dependent atelectasis posterior lower lobes. No acute infiltrate, pleural effusion or pneumothorax. Musculoskeletal: Unremarkable Review of the MIP images confirms the above findings. CT ABDOMEN and PELVIS FINDINGS Hepatobiliary: Calcified gallstones within gallbladder. Nodular cirrhotic appearing liver without focal mass. No biliary dilatation. Pancreas: Normal appearance Spleen: Capsular calcification at lateral spleen consistent with sequela of prior subcapsular hematoma as noted on prior study. No other focal splenic abnormalities. Adrenals/Urinary Tract: Adrenal glands normal appearance. Tiny LEFT renal cyst; no follow-up imaging recommended. Patchy RIGHT nephrogram question pyelonephritis. No hydronephrosis, hydroureter, or ureteral calcification. Bladder unremarkable. Stomach/Bowel: Appendix surgically absent by history. Stomach decompressed with small hiatal hernia. Mobile cecum extending across midline of upper pelvis with questionable mild wall thickening, colitis not excluded. Small bowel loops unremarkable. No evidence of bowel obstruction. Vascular/Lymphatic: Atherosclerotic calcifications aorta and iliac arteries without aneurysm. No adenopathy. Reproductive: Atrophic uterus with unremarkable adnexa Other: Small amount of nonspecific free pelvic  fluid as well as free fluid adjacent to liver. No free air. No hernia. Musculoskeletal: Diffuse osseous demineralization. Review of the MIP images confirms the above findings. IMPRESSION: No definite evidence of pulmonary embolism. Cirrhotic appearing liver with small volume ascites. Cholelithiasis. Patchy RIGHT nephrogram question pyelonephritis; recommend correlation with urinalysis. Small hiatal hernia. 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 This recommendation follows 2010 ACCF/AHA/AATS/ACR/ASA/SCA/SCAI/SIR/STS/SVM Guidelines for the Diagnosis and Management of Patients with Thoracic Aortic Disease. Circulation. 2010; 121: Z610-R604. Aortic aneurysm NOS (ICD10-I71.9) Aortic Atherosclerosis (ICD10-I70.0) Emphysema (ICD10-J43.9). Aortic aneurysm NOS (ICD10-I71.9). Electronically Signed   By: Ulyses Southward M.D.   On: 05/03/2022 14:57   CT Angio Chest PE W and/or Wo Contrast  Result Date: 05/03/2022 CLINICAL DATA:  Incoherent, positive sepsis screening, strong smell of urine, history frequent UTI, COPD, hypertension EXAM: CT ANGIOGRAPHY CHEST CT ABDOMEN AND PELVIS WITH CONTRAST TECHNIQUE: Multidetector CT imaging of the chest was performed using the standard protocol during bolus administration of intravenous contrast. Multiplanar CT image reconstructions and MIPs were obtained to evaluate the vascular anatomy. Multidetector CT imaging of the abdomen and pelvis was performed using the standard protocol during bolus administration of intravenous contrast. RADIATION DOSE REDUCTION: This exam was performed according to the departmental dose-optimization program which includes automated exposure control, adjustment of the mA and/or kV according to patient size and/or use of iterative reconstruction technique. CONTRAST:  OMNIPAQUE IOHEXOL 350 MG/ML SOLN IV. No  oral contrast. COMPARISON:  CT chest 09/13/2010, CT abdomen and pelvis 05/01/2009 FINDINGS: CTA CHEST FINDINGS Cardiovascular: Atherosclerotic calcifications aorta, proximal great vessels, and coronary arteries. 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 No evidence of aortic dissection. Enlargement of cardiac chambers. No pericardial effusion. Pulmonary arteries adequately opacified the scattered beam hardening artifacts are present. No definite evidence of pulmonary embolism. Mediastinum/Nodes: Base of cervical region normal appearance. Esophagus unremarkable. No thoracic adenopathy. Lungs/Pleura: Decreased lung volumes. Paraseptal emphysema. Scattered subpleural atelectasis. Minimal dependent atelectasis posterior lower lobes. No acute infiltrate, pleural effusion or pneumothorax. Musculoskeletal: Unremarkable Review of the MIP images confirms the above findings. CT ABDOMEN and PELVIS FINDINGS Hepatobiliary: Calcified gallstones within gallbladder. Nodular cirrhotic appearing liver without focal mass. No biliary dilatation. Pancreas: Normal appearance Spleen: Capsular calcification at lateral spleen consistent with sequela of prior subcapsular hematoma as noted on prior study. No other focal splenic abnormalities. Adrenals/Urinary Tract: Adrenal glands normal appearance.  Tiny LEFT renal cyst; no follow-up imaging recommended. Patchy RIGHT nephrogram question pyelonephritis. No hydronephrosis, hydroureter, or ureteral calcification. Bladder unremarkable. Stomach/Bowel: Appendix surgically absent by history. Stomach decompressed with small hiatal hernia. Mobile cecum extending across midline of upper pelvis with questionable mild wall thickening, colitis not excluded. Small bowel loops unremarkable. No evidence of bowel obstruction. Vascular/Lymphatic: Atherosclerotic calcifications aorta and iliac arteries without aneurysm. No adenopathy. Reproductive: Atrophic uterus with  unremarkable adnexa Other: Small amount of nonspecific free pelvic fluid as well as free fluid adjacent to liver. No free air. No hernia. Musculoskeletal: Diffuse osseous demineralization. Review of the MIP images confirms the above findings. IMPRESSION: No definite evidence of pulmonary embolism. Cirrhotic appearing liver with small volume ascites. Cholelithiasis. Patchy RIGHT nephrogram question pyelonephritis; recommend correlation with urinalysis. Small hiatal hernia. 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 This recommendation follows 2010 ACCF/AHA/AATS/ACR/ASA/SCA/SCAI/SIR/STS/SVM Guidelines for the Diagnosis and Management of Patients with Thoracic Aortic Disease. Circulation. 2010; 121: [id number]. Aortic aneurysm NOS (ICD10-I71.9) Aortic Atherosclerosis (ICD10-I70.0) Emphysema (ICD10-J43.9). Aortic aneurysm NOS (ICD10-I71.9). Electronically Signed   By: Ulyses Southward M.D.   On: 05/03/2022 14:57   CT HEAD WO CONTRAST ( )  Result Date: 05/03/2022 CLINICAL DATA:  Mental status change.  Possible sepsis EXAM: CT HEAD WITHOUT CONTRAST TECHNIQUE: Contiguous axial images were obtained from the base of the skull through the vertex without intravenous contrast. RADIATION DOSE REDUCTION: This exam was performed according to the departmental dose-optimization program which includes automated exposure control, adjustment of the mA and/or kV according to patient size and/or use of iterative reconstruction technique. COMPARISON:  None Available. FINDINGS: Brain: Mild hypodensity in the white matter bilaterally most consistent with chronic microvascular ischemia. Ventricle size and cerebral volume normal Negative for acute infarct, hemorrhage, mass Vascular: Negative for hyperdense vessel Skull: Negative Sinuses/Orbits: Paranasal sinuses clear.  Negative orbit Other: None  IMPRESSION: No acute abnormality. Chronic microvascular ischemic changes in the white matter. Electronically Signed   By: Marlan Palau M.D.   On: 05/03/2022 14:44   DG Chest Port 1 View  Result Date: 05/03/2022 CLINICAL DATA:  Questionable sepsis - evaluate for abnormality. EXAM: PORTABLE CHEST 1 VIEW COMPARISON:  Chest radiograph 07/25/2016 FINDINGS: The cardiac silhouette remains enlarged. Aortic atherosclerosis is noted. Pulmonary vascular congestion and mild prominence of the interstitial markings are similar to the prior study. No focal airspace consolidation, sizeable pleural effusion, or pneumothorax is identified. No acute osseous abnormality is seen. IMPRESSION: Unchanged cardiomegaly and pulmonary vascular congestion/possible mild interstitial edema. Electronically Signed   By: Sebastian Ache M.D.   On: 05/03/2022 13:12    EKG: Independently reviewed.  Sinus, no acute ST changes.  Assessment/Plan Principal Problem:   Sepsis (HCC) Active Problems:   Acute pyelonephritis  (please populate well all problems here in Problem List. (For example, if patient is on BP meds at home and you resume or decide to hold them, it is a problem that needs to be her. Same for CAD, COPD, HLD and so on)  Sepsis -Evidenced by initial presentation tachycardia, elevated lactate level, and symptoms of endorgan damage of acute metabolic encephalopathy.  Suspected infection source is early pyelonephritis/complicated UTI.  Continue ceftriaxone for now. -Patient has both symptoms and signs of volume depletion as well as overload, and as she does have baseline cirrhosis, and low albumin level, decided to use colloid instead of crystalloid for hydration.  Albumin every 6 hours x3. -Other  DDx, ammonium level pending, check TSH.  According to husband patient has been taking the same dose of narcotic and gabapentin, less likely to cause polypharmacy at this point.  Complicated UTI/acute pyelonephritis on right  side -Ceftriaxone  Hypokalemia -P.o. replacement  Hypomagnesemia -IV replacement x1.  COPD -No symptoms or signs of acute exacerbation, continue current breathing regimen including Spiriva.  Gout -She is on colchicine 0.6 mg at bedtime, her creatinine has been stable.  Cirrhosis secondary to NASH with chronic hypoalbuminemia -Ammonium level pending -Showed minimum ascites on CT scan, less concerning about SBP.  OSA -CPAP at bedtime  Morbid obesity -BMI= 54, recommend calorie control and outpatient PCP follow-up.  Anxiety/depression -Continue imipramine  DVT prophylaxis: Lovenox Code Status: Full code Family Communication: Husband at bedside Disposition Plan: Expect less than 2 midnight hospital stay Consults called: None Admission status: Telemetry observation   Emeline General MD [facility] Pager [phone]  05/03/2022, 5:00 PM

## 2022-05-04 ENCOUNTER — Other Ambulatory Visit: Payer: Self-pay

## 2022-05-04 ENCOUNTER — Encounter: Payer: Self-pay | Admitting: Internal Medicine

## 2022-05-04 DIAGNOSIS — N10 Acute tubulo-interstitial nephritis: Secondary | ICD-10-CM | POA: Diagnosis not present

## 2022-05-04 DIAGNOSIS — R6520 Severe sepsis without septic shock: Secondary | ICD-10-CM | POA: Diagnosis not present

## 2022-05-04 DIAGNOSIS — K7682 Hepatic encephalopathy: Secondary | ICD-10-CM | POA: Diagnosis present

## 2022-05-04 DIAGNOSIS — A419 Sepsis, unspecified organism: Secondary | ICD-10-CM | POA: Diagnosis not present

## 2022-05-04 LAB — CBC
HCT: 33.9 % — ABNORMAL LOW (ref 36.0–46.0)
Hemoglobin: 10.8 g/dL — ABNORMAL LOW (ref 12.0–15.0)
MCH: 31.2 pg (ref 26.0–34.0)
MCHC: 31.9 g/dL (ref 30.0–36.0)
MCV: 98 fL (ref 80.0–100.0)
Platelets: 98 10*3/uL — ABNORMAL LOW (ref 150–400)
RBC: 3.46 MIL/uL — ABNORMAL LOW (ref 3.87–5.11)
RDW: 15.6 % — ABNORMAL HIGH (ref 11.5–15.5)
WBC: 3.6 10*3/uL — ABNORMAL LOW (ref 4.0–10.5)
nRBC: 0 % (ref 0.0–0.2)

## 2022-05-04 LAB — BASIC METABOLIC PANEL
Anion gap: 5 (ref 5–15)
BUN: 12 mg/dL (ref 8–23)
CO2: 25 mmol/L (ref 22–32)
Calcium: 9.3 mg/dL (ref 8.9–10.3)
Chloride: 111 mmol/L (ref 98–111)
Creatinine, Ser: 1.11 mg/dL — ABNORMAL HIGH (ref 0.44–1.00)
GFR, Estimated: 54 mL/min — ABNORMAL LOW (ref >60)
Glucose, Bld: 96 mg/dL (ref 70–99)
Potassium: 3.7 mmol/L (ref 3.5–5.1)
Sodium: 141 mmol/L (ref 135–145)

## 2022-05-04 LAB — HIV ANTIBODY (ROUTINE TESTING W REFLEX): HIV Screen 4th Generation wRfx: NONREACTIVE

## 2022-05-04 LAB — MAGNESIUM: Magnesium: 2.3 mg/dL (ref 1.7–2.4)

## 2022-05-04 LAB — PROCALCITONIN: Procalcitonin: <0.1 ng/mL

## 2022-05-04 MED ORDER — LACTULOSE 10 GM/15ML PO SOLN
20.0000 g | Freq: Two times a day (BID) | ORAL | Status: DC
  Administered 2022-05-04 – 2022-05-05 (×3): 20 g via ORAL
  Filled 2022-05-04 (×3): qty 30

## 2022-05-04 MED ORDER — ENOXAPARIN SODIUM 80 MG/0.8ML IJ SOSY
0.5000 mg/kg | PREFILLED_SYRINGE | INTRAMUSCULAR | Status: DC
  Administered 2022-05-04 – 2022-05-06 (×3): 70 mg via SUBCUTANEOUS
  Filled 2022-05-04 (×3): qty 0.7

## 2022-05-04 NOTE — Progress Notes (Addendum)
Progress Note    Carla Huerta  RUE:[id number] DOB: Feb 28, 1951  DOA: 05/03/2022 PCP: Hillery Aldo, MD      Brief Narrative:    Medical records reviewed and are as summarized below:  Carla Huerta is a 71 y.o. 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      Assessment/Plan:   Principal Problem:   Sepsis (HCC) Active Problems:   Acute pyelonephritis   Acute hepatic encephalopathy (HCC)   Body mass index is 51.73 kg/m.  (Morbid obesity)  Altered mental status/confusion: Resolved.  Elevated ammonia, flapping tremor, recent confusion, probable hepatic encephalopathy: Start lactulose.  Suspected sepsis from acute pyelonephritis: No urinary symptoms but CT abdomen concerning for right pyelonephritis.  Continue IV Rocephin for now.  Follow-up urine and blood cultures.  Hypoxia: Oxygen saturation was 87% on room air.  Wean off oxygen as able.  Check pulse oximetry with ambulation  Thrombocytopenia: Probably from liver cirrhosis.  Repeat CBC.  Hypokalemia and hypomagnesemia: Improved  Other comorbidities include COPD, hypertension, CKD stage II, anxiety, depression, OSA on CPAP at night  Plan discussed with Archie Patten, daughter, over the phone  Diet Order             Diet Heart Room service appropriate? Yes; Fluid consistency: Thin  Diet effective now                            Consultants: None  Procedures: None    Medications:    aspirin  81 mg Oral QHS   atorvastatin  40 mg Oral Daily   cholecalciferol  1,000 Units Oral Daily   colchicine  0.6 mg Oral QHS   enoxaparin (LOVENOX) injection  0.5 mg/kg Subcutaneous Q24H   ferrous sulfate  325 mg Oral Daily   fesoterodine  4 mg Oral Daily   gabapentin  900 mg Oral TID   imipramine  100 mg Oral QHS   lactulose  20 g Oral  BID   lisinopril  10 mg Oral Daily   pantoprazole  40 mg Oral Daily   tiotropium  18 mcg Inhalation Daily   Continuous Infusions:  cefTRIAXone (ROCEPHIN)  IV 1 g (05/04/22 1117)     Anti-infectives (From admission, onward)    Start     Dose/Rate Route Frequency Ordered Stop   05/04/22 1000  cefTRIAXone (ROCEPHIN) 1 g in sodium chloride 0.9 % 100 mL IVPB        1 g 200 mL/hr over 30 Minutes Intravenous Every 24 hours 05/03/22 1658     05/03/22 1700  cefTRIAXone (ROCEPHIN) 1 g in sodium chloride 0.9 % 100 mL IVPB        1 g 200 mL/hr over 30 Minutes Intravenous  Once 05/03/22 1657 05/03/22 1858   05/03/22 1345  cefTRIAXone (ROCEPHIN) 1 g in sodium chloride 0.9 % 100 mL IVPB        1 g 200 mL/hr over 30 Minutes Intravenous  Once 05/03/22 1335 05/03/22 1433              Family Communication/Anticipated D/C date and plan/Code Status   DVT prophylaxis:      Code Status: Full Code  Family Communication: Archie Patten, daughter Disposition Plan: Plan to discharge home tomorrow   Status is: Observation The patient will require care spanning >  2 midnights and should be moved to inpatient because: Follow-up cultures       Subjective:   Interval events noted.  No abdominal pain, flank pain, nausea, vomiting, dysuria or any urinary symptoms.  She said she has been dropping things lately, probably for about a week prior to admission.  Objective:    Vitals:   05/03/22 2128 05/04/22 0436 05/04/22 0804 05/04/22 0809  BP: 132/72 121/66 125/62   Pulse: 92 90 93 91  Resp: 20 16 18    Temp: 98.7 F (37.1 C) 98.6 F (37 C) 98 F (36.7 C)   TempSrc: Oral Oral Oral   SpO2: 91% 90% (!) 87% 93%  Weight: (!) 141 kg     Height: 5\' 5"  (1.651 m)      No data found.   Intake/Output Summary (Last 24 hours) at 05/04/2022 1355 Last data filed at 05/04/2022 0458 Gross per 24 hour  Intake 1742.68 ml  Output 400 ml  Net 1342.68 ml   Filed Weights   05/03/22 1235 05/03/22 2128   Weight: (!) 149.7 kg (!) 141 kg    Exam:  GEN: NAD SKIN: No rash EYES: EOMI ENT: MMM CV: RRR PULM: CTA B ABD: soft, obese, NT, +BS CNS: AAO x 3, non focal EXT: No edema or tenderness, flapping tremor        Data Reviewed:   I have personally reviewed following labs and imaging studies:  Labs: Labs show the following:   Basic Metabolic Panel: Recent Labs  Lab 05/03/22 1246 05/04/22 0658  NA 138 141  K 3.4* 3.7  CL 107 111  CO2 22 25  GLUCOSE 103* 96  BUN 11 12  CREATININE 1.22* 1.11*  CALCIUM 9.2 9.3  MG 1.6*  --    GFR Estimated Creatinine Clearance: 68.4 mL/min (A) (by C-G formula based on SCr of 1.11 mg/dL (H)). Liver Function Tests: Recent Labs  Lab 05/03/22 1246  AST 58*  ALT 26  ALKPHOS 104  BILITOT 1.3*  PROT 6.3*  ALBUMIN 2.9*   No results for input(s): "LIPASE", "AMYLASE" in the last 168 hours. Recent Labs  Lab 05/03/22 2248  AMMONIA 67*   Coagulation profile Recent Labs  Lab 05/03/22 1246  INR 1.3*    CBC: Recent Labs  Lab 05/03/22 1246 05/04/22 0658  WBC 4.8 3.6*  NEUTROABS 3.8  --   HGB 11.6* 10.8*  HCT 36.5 33.9*  MCV 97.1 98.0  PLT 111* 98*   Cardiac Enzymes: No results for input(s): "CKTOTAL", "CKMB", "CKMBINDEX", "TROPONINI" in the last 168 hours. BNP (last 3 results) No results for input(s): "PROBNP" in the last 8760 hours. CBG: No results for input(s): "GLUCAP" in the last 168 hours. D-Dimer: No results for input(s): "DDIMER" in the last 72 hours. Hgb A1c: No results for input(s): "HGBA1C" in the last 72 hours. Lipid Profile: No results for input(s): "CHOL", "HDL", "LDLCALC", "TRIG", "CHOLHDL", "LDLDIRECT" in the last 72 hours. Thyroid function studies: Recent Labs    05/03/22 1246  TSH 0.563   Anemia work up: No results for input(s): "VITAMINB12", "FOLATE", "FERRITIN", "TIBC", "IRON", "RETICCTPCT" in the last 72 hours. Sepsis Labs: Recent Labs  Lab 05/03/22 1246 05/03/22 1248 05/03/22 1440  05/04/22 0658  PROCALCITON <0.10  --   --  <0.10  WBC 4.8  --   --  3.6*  LATICACIDVEN  --  3.5* 1.9  --     Microbiology Recent Results (from the past 240 hour(s))  Blood Culture (routine x 2)  Status: None (Preliminary result)   Collection Time: 05/03/22 12:48 PM   Specimen: BLOOD  Result Value Ref Range Status   Specimen Description BLOOD RIGHT WRIST  Final   Special Requests   Final    BOTTLES DRAWN AEROBIC AND ANAEROBIC Blood Culture adequate volume   Culture   Final    NO GROWTH < 24 HOURS Performed at [facility], [address] [place], [place] [address]    Report Status PENDING  Incomplete  Blood Culture (routine x 2)     Status: None (Preliminary result)   Collection Time: 05/03/22 12:55 PM   Specimen: BLOOD  Result Value Ref Range Status   Specimen Description BLOOD LEFT AC  Final   Special Requests   Final    BOTTLES DRAWN AEROBIC AND ANAEROBIC Blood Culture adequate volume   Culture   Final    NO GROWTH < 24 HOURS Performed at [facility], [address] [place], [place] [address]    Report Status PENDING  Incomplete    Procedures and diagnostic studies:  CT ABDOMEN PELVIS W CONTRAST  Result Date: 05/03/2022 CLINICAL DATA:  Incoherent, positive sepsis screening, strong smell of urine, history frequent UTI, COPD, hypertension EXAM: CT ANGIOGRAPHY CHEST CT ABDOMEN AND PELVIS WITH CONTRAST TECHNIQUE: Multidetector CT imaging of the chest was performed using the standard protocol during bolus administration of intravenous contrast. Multiplanar CT image reconstructions and MIPs were obtained to evaluate the vascular anatomy. Multidetector CT imaging of the abdomen and pelvis was performed using the standard protocol during bolus administration of intravenous contrast. RADIATION DOSE REDUCTION: This exam was performed according to the departmental dose-optimization program which includes automated exposure control, adjustment of the mA and/or  kV according to patient size and/or use of iterative reconstruction technique. CONTRAST:  OMNIPAQUE IOHEXOL 350 MG/ML SOLN IV. No oral contrast. COMPARISON:  CT chest 09/13/2010, CT abdomen and pelvis 05/01/2009 FINDINGS: CTA CHEST FINDINGS Cardiovascular: Atherosclerotic calcifications aorta, proximal great vessels, and coronary arteries. 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 No evidence of aortic dissection. Enlargement of cardiac chambers. No pericardial effusion. Pulmonary arteries adequately opacified the scattered beam hardening artifacts are present. No definite evidence of pulmonary embolism. Mediastinum/Nodes: Base of cervical region normal appearance. Esophagus unremarkable. No thoracic adenopathy. Lungs/Pleura: Decreased lung volumes. Paraseptal emphysema. Scattered subpleural atelectasis. Minimal dependent atelectasis posterior lower lobes. No acute infiltrate, pleural effusion or pneumothorax. Musculoskeletal: Unremarkable Review of the MIP images confirms the above findings. CT ABDOMEN and PELVIS FINDINGS Hepatobiliary: Calcified gallstones within gallbladder. Nodular cirrhotic appearing liver without focal mass. No biliary dilatation. Pancreas: Normal appearance Spleen: Capsular calcification at lateral spleen consistent with sequela of prior subcapsular hematoma as noted on prior study. No other focal splenic abnormalities. Adrenals/Urinary Tract: Adrenal glands normal appearance. Tiny LEFT renal cyst; no follow-up imaging recommended. Patchy RIGHT nephrogram question pyelonephritis. No hydronephrosis, hydroureter, or ureteral calcification. Bladder unremarkable. Stomach/Bowel: Appendix surgically absent by history. Stomach decompressed with small hiatal hernia. Mobile cecum extending across midline of upper pelvis with questionable mild wall thickening, colitis not excluded. Small bowel loops unremarkable. No evidence of bowel obstruction.  Vascular/Lymphatic: Atherosclerotic calcifications aorta and iliac arteries without aneurysm. No adenopathy. Reproductive: Atrophic uterus with unremarkable adnexa Other: Small amount of nonspecific free pelvic fluid as well as free fluid adjacent to liver. No free air. No hernia. Musculoskeletal: Diffuse osseous demineralization. Review of the MIP images confirms the above findings. IMPRESSION: No definite evidence of pulmonary embolism. Cirrhotic appearing liver with small  volume ascites. Cholelithiasis. Patchy RIGHT nephrogram question pyelonephritis; recommend correlation with urinalysis. Small hiatal hernia. 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 This recommendation follows 2010 ACCF/AHA/AATS/ACR/ASA/SCA/SCAI/SIR/STS/SVM Guidelines for the Diagnosis and Management of Patients with Thoracic Aortic Disease. Circulation. 2010; 121: N629-B284. Aortic aneurysm NOS (ICD10-I71.9) Aortic Atherosclerosis (ICD10-I70.0) Emphysema (ICD10-J43.9). Aortic aneurysm NOS (ICD10-I71.9). Electronically Signed   By: Ulyses Southward M.D.   On: 05/03/2022 14:57   CT Angio Chest PE W and/or Wo Contrast  Result Date: 05/03/2022 CLINICAL DATA:  Incoherent, positive sepsis screening, strong smell of urine, history frequent UTI, COPD, hypertension EXAM: CT ANGIOGRAPHY CHEST CT ABDOMEN AND PELVIS WITH CONTRAST TECHNIQUE: Multidetector CT imaging of the chest was performed using the standard protocol during bolus administration of intravenous contrast. Multiplanar CT image reconstructions and MIPs were obtained to evaluate the vascular anatomy. Multidetector CT imaging of the abdomen and pelvis was performed using the standard protocol during bolus administration of intravenous contrast. RADIATION DOSE REDUCTION: This exam was performed according to the departmental dose-optimization  program which includes automated exposure control, adjustment of the mA and/or kV according to patient size and/or use of iterative reconstruction technique. CONTRAST:  OMNIPAQUE IOHEXOL 350 MG/ML SOLN IV. No oral contrast. COMPARISON:  CT chest 09/13/2010, CT abdomen and pelvis 05/01/2009 FINDINGS: CTA CHEST FINDINGS Cardiovascular: Atherosclerotic calcifications aorta, proximal great vessels, and coronary arteries. 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 No evidence of aortic dissection. Enlargement of cardiac chambers. No pericardial effusion. Pulmonary arteries adequately opacified the scattered beam hardening artifacts are present. No definite evidence of pulmonary embolism. Mediastinum/Nodes: Base of cervical region normal appearance. Esophagus unremarkable. No thoracic adenopathy. Lungs/Pleura: Decreased lung volumes. Paraseptal emphysema. Scattered subpleural atelectasis. Minimal dependent atelectasis posterior lower lobes. No acute infiltrate, pleural effusion or pneumothorax. Musculoskeletal: Unremarkable Review of the MIP images confirms the above findings. CT ABDOMEN and PELVIS FINDINGS Hepatobiliary: Calcified gallstones within gallbladder. Nodular cirrhotic appearing liver without focal mass. No biliary dilatation. Pancreas: Normal appearance Spleen: Capsular calcification at lateral spleen consistent with sequela of prior subcapsular hematoma as noted on prior study. No other focal splenic abnormalities. Adrenals/Urinary Tract: Adrenal glands normal appearance. Tiny LEFT renal cyst; no follow-up imaging recommended. Patchy RIGHT nephrogram question pyelonephritis. No hydronephrosis, hydroureter, or ureteral calcification. Bladder unremarkable. Stomach/Bowel: Appendix surgically absent by history. Stomach decompressed with small hiatal hernia. Mobile cecum extending across midline of upper pelvis with questionable mild wall thickening, colitis not excluded.  Small bowel loops unremarkable. No evidence of bowel obstruction. Vascular/Lymphatic: Atherosclerotic calcifications aorta and iliac arteries without aneurysm. No adenopathy. Reproductive: Atrophic uterus with unremarkable adnexa Other: Small amount of nonspecific free pelvic fluid as well as free fluid adjacent to liver. No free air. No hernia. Musculoskeletal: Diffuse osseous demineralization. Review of the MIP images confirms the above findings. IMPRESSION: No definite evidence of pulmonary embolism. Cirrhotic appearing liver with small volume ascites. Cholelithiasis. Patchy RIGHT nephrogram question pyelonephritis; recommend correlation with urinalysis. Small hiatal hernia. 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 This recommendation follows 2010 ACCF/AHA/AATS/ACR/ASA/SCA/SCAI/SIR/STS/SVM Guidelines for the Diagnosis and Management of Patients with Thoracic Aortic Disease. Circulation. 2010; 121: X324-M010. Aortic aneurysm NOS (ICD10-I71.9) Aortic Atherosclerosis (ICD10-I70.0) Emphysema (ICD10-J43.9). Aortic aneurysm NOS (ICD10-I71.9). Electronically Signed   By: Ulyses Southward M.D.   On: 05/03/2022 14:57   CT HEAD WO  CONTRAST ( )  Result Date: 05/03/2022 CLINICAL DATA:  Mental status change.  Possible sepsis EXAM: CT HEAD WITHOUT CONTRAST TECHNIQUE: Contiguous axial images were obtained from the base of the skull through the vertex without intravenous contrast. RADIATION DOSE REDUCTION: This exam was performed according to the departmental dose-optimization program which includes automated exposure control, adjustment of the mA and/or kV according to patient size and/or use of iterative reconstruction technique. COMPARISON:  None Available. FINDINGS: Brain: Mild hypodensity in the white matter bilaterally most consistent with chronic microvascular ischemia.  Ventricle size and cerebral volume normal Negative for acute infarct, hemorrhage, mass Vascular: Negative for hyperdense vessel Skull: Negative Sinuses/Orbits: Paranasal sinuses clear.  Negative orbit Other: None IMPRESSION: No acute abnormality. Chronic microvascular ischemic changes in the white matter. Electronically Signed   By: Marlan Palau M.D.   On: 05/03/2022 14:44   DG Chest Port 1 View  Result Date: 05/03/2022 CLINICAL DATA:  Questionable sepsis - evaluate for abnormality. EXAM: PORTABLE CHEST 1 VIEW COMPARISON:  Chest radiograph 07/25/2016 FINDINGS: The cardiac silhouette remains enlarged. Aortic atherosclerosis is noted. Pulmonary vascular congestion and mild prominence of the interstitial markings are similar to the prior study. No focal airspace consolidation, sizeable pleural effusion, or pneumothorax is identified. No acute osseous abnormality is seen. IMPRESSION: Unchanged cardiomegaly and pulmonary vascular congestion/possible mild interstitial edema. Electronically Signed   By: Sebastian Ache M.D.   On: 05/03/2022 13:12               LOS: 0 days   [place]  [facility]   Pager on [email or web address]. If 7PM-7AM, please contact night-coverage at [email or web address]     05/04/2022, 1:55 PM

## 2022-05-05 DIAGNOSIS — G9341 Metabolic encephalopathy: Secondary | ICD-10-CM | POA: Diagnosis present

## 2022-05-05 DIAGNOSIS — I129 Hypertensive chronic kidney disease with stage 1 through stage 4 chronic kidney disease, or unspecified chronic kidney disease: Secondary | ICD-10-CM | POA: Diagnosis present

## 2022-05-05 DIAGNOSIS — E8809 Other disorders of plasma-protein metabolism, not elsewhere classified: Secondary | ICD-10-CM | POA: Diagnosis present

## 2022-05-05 DIAGNOSIS — A4151 Sepsis due to Escherichia coli [E. coli]: Secondary | ICD-10-CM | POA: Diagnosis present

## 2022-05-05 DIAGNOSIS — D6959 Other secondary thrombocytopenia: Secondary | ICD-10-CM | POA: Diagnosis present

## 2022-05-05 DIAGNOSIS — G4733 Obstructive sleep apnea (adult) (pediatric): Secondary | ICD-10-CM | POA: Diagnosis present

## 2022-05-05 DIAGNOSIS — K7581 Nonalcoholic steatohepatitis (NASH): Secondary | ICD-10-CM | POA: Diagnosis present

## 2022-05-05 DIAGNOSIS — F419 Anxiety disorder, unspecified: Secondary | ICD-10-CM | POA: Diagnosis present

## 2022-05-05 DIAGNOSIS — Z6841 Body Mass Index (BMI) 40.0 and over, adult: Secondary | ICD-10-CM | POA: Diagnosis not present

## 2022-05-05 DIAGNOSIS — J449 Chronic obstructive pulmonary disease, unspecified: Secondary | ICD-10-CM | POA: Diagnosis present

## 2022-05-05 DIAGNOSIS — E872 Acidosis, unspecified: Secondary | ICD-10-CM | POA: Diagnosis present

## 2022-05-05 DIAGNOSIS — K7460 Unspecified cirrhosis of liver: Secondary | ICD-10-CM | POA: Diagnosis present

## 2022-05-05 DIAGNOSIS — I959 Hypotension, unspecified: Secondary | ICD-10-CM | POA: Diagnosis not present

## 2022-05-05 DIAGNOSIS — N10 Acute tubulo-interstitial nephritis: Secondary | ICD-10-CM | POA: Diagnosis present

## 2022-05-05 DIAGNOSIS — K7682 Hepatic encephalopathy: Secondary | ICD-10-CM | POA: Diagnosis present

## 2022-05-05 DIAGNOSIS — E876 Hypokalemia: Secondary | ICD-10-CM | POA: Diagnosis present

## 2022-05-05 DIAGNOSIS — N182 Chronic kidney disease, stage 2 (mild): Secondary | ICD-10-CM | POA: Diagnosis present

## 2022-05-05 DIAGNOSIS — F32A Depression, unspecified: Secondary | ICD-10-CM | POA: Diagnosis present

## 2022-05-05 DIAGNOSIS — R6520 Severe sepsis without septic shock: Secondary | ICD-10-CM | POA: Diagnosis not present

## 2022-05-05 DIAGNOSIS — J9611 Chronic respiratory failure with hypoxia: Secondary | ICD-10-CM | POA: Diagnosis present

## 2022-05-05 DIAGNOSIS — I714 Abdominal aortic aneurysm, without rupture, unspecified: Secondary | ICD-10-CM | POA: Diagnosis present

## 2022-05-05 DIAGNOSIS — M109 Gout, unspecified: Secondary | ICD-10-CM | POA: Diagnosis present

## 2022-05-05 DIAGNOSIS — E871 Hypo-osmolality and hyponatremia: Secondary | ICD-10-CM | POA: Diagnosis present

## 2022-05-05 LAB — CBC
HCT: 34.3 % — ABNORMAL LOW (ref 36.0–46.0)
Hemoglobin: 10.9 g/dL — ABNORMAL LOW (ref 12.0–15.0)
MCH: 31.1 pg (ref 26.0–34.0)
MCHC: 31.8 g/dL (ref 30.0–36.0)
MCV: 98 fL (ref 80.0–100.0)
Platelets: 101 10*3/uL — ABNORMAL LOW (ref 150–400)
RBC: 3.5 MIL/uL — ABNORMAL LOW (ref 3.87–5.11)
RDW: 15.4 % (ref 11.5–15.5)
WBC: 4 10*3/uL (ref 4.0–10.5)
nRBC: 0 % (ref 0.0–0.2)

## 2022-05-05 LAB — AMMONIA: Ammonia: 71 umol/L — ABNORMAL HIGH (ref 9–35)

## 2022-05-05 LAB — PROCALCITONIN: Procalcitonin: <0.1 ng/mL

## 2022-05-05 MED ORDER — RIFAXIMIN 200 MG PO TABS: 200.0000 mg | ORAL_TABLET | Freq: Two times a day (BID) | ORAL | Status: DC

## 2022-05-05 MED ORDER — IMIPRAMINE HCL 50 MG PO TABS
50.0000 mg | ORAL_TABLET | Freq: Every day | ORAL | Status: DC
  Administered 2022-05-05 – 2022-05-06 (×2): 50 mg via ORAL
  Filled 2022-05-05 (×2): qty 1

## 2022-05-05 MED ORDER — LACTULOSE 10 GM/15ML PO SOLN
30.0000 g | Freq: Three times a day (TID) | ORAL | Status: DC
  Administered 2022-05-05 – 2022-05-06 (×4): 30 g via ORAL
  Filled 2022-05-05 (×5): qty 60

## 2022-05-05 MED ORDER — RIFAXIMIN 550 MG PO TABS
550.0000 mg | ORAL_TABLET | Freq: Two times a day (BID) | ORAL | Status: DC
  Administered 2022-05-05 – 2022-05-07 (×5): 550 mg via ORAL
  Filled 2022-05-05 (×5): qty 1

## 2022-05-05 NOTE — Progress Notes (Addendum)
Progress Note    Carla Huerta  YNW:[id number] DOB: 08/10/1951  DOA: 05/03/2022 PCP: Hillery Aldo, MD      Brief Narrative:    Medical records reviewed and are as summarized below:  Carla Huerta is a 71 y.o. 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      Assessment/Plan:   Principal Problem:   Sepsis (HCC) Active Problems:   Acute pyelonephritis   Acute hepatic encephalopathy (HCC)   Body mass index is 51.73 kg/m.  (Morbid obesity)  Altered mental status/confusion, drowsiness: Patient was more drowsy today.  Suspect she is encephalopathic from high ammonia level.  Decrease Imipramine from 100-50 nightly  Hepatic encephalopathy: Ammonia level has gone from 67-71.  Increase lactulose.  Add rifaximin.  Monitor ammonia level.  Suspected sepsis from acute pyelonephritis: No urinary symptoms but CT abdomen concerning for right pyelonephritis.  Urine culture showed E. coli.  Continue IV Rocephin.  No growth on blood cultures thus far.  Hypotension: Hold lisinopril and monitor BP closely  Hypoxia probably chronic from COPD: Oxygen saturation dropped to 86% with sitting/standing with physical therapist.  Continue 2 L/min oxygen.  She may need home oxygen.  Thrombocytopenia: Probably chronic from liver cirrhosis.    Hypokalemia and hypomagnesemia: Improved  Other comorbidities include COPD, hypertension, CKD stage II, anxiety, depression, OSA on CPAP at night  Plan discussed with Archie Patten, daughter, over the phone  Diet Order             Diet Heart Room service appropriate? Yes; Fluid consistency: Thin  Diet effective now                            Consultants: None  Procedures: None    Medications:    aspirin  81 mg Oral QHS   atorvastatin  40 mg Oral Daily    cholecalciferol  1,000 Units Oral Daily   enoxaparin (LOVENOX) injection  0.5 mg/kg Subcutaneous Q24H   ferrous sulfate  325 mg Oral Daily   fesoterodine  4 mg Oral Daily   gabapentin  900 mg Oral TID   imipramine  100 mg Oral QHS   lactulose  30 g Oral TID   lisinopril  10 mg Oral Daily   pantoprazole  40 mg Oral Daily   rifaximin  550 mg Oral BID   tiotropium  18 mcg Inhalation Daily   Continuous Infusions:  cefTRIAXone (ROCEPHIN)  IV 1 g (05/05/22 0831)     Anti-infectives (From admission, onward)    Start     Dose/Rate Route Frequency Ordered Stop   05/05/22 1330  rifaximin (XIFAXAN) tablet 550 mg        550 mg Oral 2 times daily 05/05/22 1243     05/05/22 1200  rifaximin (XIFAXAN) tablet 200 mg  Status:  Discontinued        200 mg Oral 2 times daily 05/05/22 1111 05/05/22 1242   05/04/22 1000  cefTRIAXone (ROCEPHIN) 1 g in sodium chloride 0.9 % 100 mL IVPB        1 g 200 mL/hr over 30 Minutes Intravenous Every 24 hours 05/03/22 1658     05/03/22 1700  cefTRIAXone (ROCEPHIN) 1 g in sodium chloride 0.9 % 100 mL IVPB        1 g 200 mL/hr  over 30 Minutes Intravenous  Once 05/03/22 1657 05/03/22 1858   05/03/22 1345  cefTRIAXone (ROCEPHIN) 1 g in sodium chloride 0.9 % 100 mL IVPB        1 g 200 mL/hr over 30 Minutes Intravenous  Once 05/03/22 1335 05/03/22 1433              Family Communication/Anticipated D/C date and plan/Code Status   DVT prophylaxis:      Code Status: Full Code  Family Communication: Tonya, daughter, over the phone Disposition Plan: Plan to discharge in 1 to 2 days   Status is: Observation The patient will require care spanning > 2 midnights and should be moved to inpatient because: Follow-up cultures       Subjective:   Interval events noted.  Patient complains of feeling drowsy and weak.  She still has tremors of both hands.  She feels much weaker today.  She reports mid abdominal pain which she did not have yesterday.  No  vomiting.  She had 1 bowel movement last night.  She does not feel as well as she did yesterday.  Kristen, PT, at the bedside  Objective:    Vitals:   05/04/22 1606 05/04/22 1929 05/05/22 0450 05/05/22 0801  BP: 129/64 (!) 112/53 (!) 153/60 (!) 124/59  Pulse: 88 91 98 96  Resp: 18 20 20 18   Temp: 98.1 F (36.7 C) 98.3 F (36.8 C) 98.2 F (36.8 C) 97.8 F (36.6 C)  TempSrc: Oral Oral Oral   SpO2: (!) 88% 93% 93% (!) 89%  Weight:      Height:       Orthostatic VS for the past 24 hrs:  BP- Sitting Pulse- Sitting BP- Standing at 0 minutes Pulse- Standing at 0 minutes  05/05/22 1410 112/62 96 (!) 89/62 87     Intake/Output Summary (Last 24 hours) at 05/05/2022 1424 Last data filed at 05/05/2022 0551 Gross per 24 hour  Intake 98.45 ml  Output 1600 ml  Net -1501.55 ml   Filed Weights   05/03/22 1235 05/03/22 2128  Weight: (!) 149.7 kg (!) 141 kg    Exam:  GEN: NAD SKIN: Warm and dry EYES: EOMI ENT: MMM CV: RRR PULM: CTA B ABD: soft, obese, NT, +BS CNS: Drowsy but communicative and oriented, non focal EXT: No edema or tenderness, flapping tremor of bilateral hands      Data Reviewed:   I have personally reviewed following labs and imaging studies:  Labs: Labs show the following:   Basic Metabolic Panel: Recent Labs  Lab 05/03/22 1246 05/04/22 0658  NA 138 141  K 3.4* 3.7  CL 107 111  CO2 22 25  GLUCOSE 103* 96  BUN 11 12  CREATININE 1.22* 1.11*  CALCIUM 9.2 9.3  MG 1.6* 2.3   GFR Estimated Creatinine Clearance: 68.4 mL/min (A) (by C-G formula based on SCr of 1.11 mg/dL (H)). Liver Function Tests: Recent Labs  Lab 05/03/22 1246  AST 58*  ALT 26  ALKPHOS 104  BILITOT 1.3*  PROT 6.3*  ALBUMIN 2.9*   No results for input(s): "LIPASE", "AMYLASE" in the last 168 hours. Recent Labs  Lab 05/03/22 2248 05/05/22 0656  AMMONIA 67* 71*   Coagulation profile Recent Labs  Lab 05/03/22 1246  INR 1.3*    CBC: Recent Labs  Lab  05/03/22 1246 05/04/22 0658 05/05/22 0656  WBC 4.8 3.6* 4.0  NEUTROABS 3.8  --   --   HGB 11.6* 10.8* 10.9*  HCT 36.5 33.9*  34.3*  MCV 97.1 98.0 98.0  PLT 111* 98* 101*   Cardiac Enzymes: No results for input(s): "CKTOTAL", "CKMB", "CKMBINDEX", "TROPONINI" in the last 168 hours. BNP (last 3 results) No results for input(s): "PROBNP" in the last 8760 hours. CBG: No results for input(s): "GLUCAP" in the last 168 hours. D-Dimer: No results for input(s): "DDIMER" in the last 72 hours. Hgb A1c: No results for input(s): "HGBA1C" in the last 72 hours. Lipid Profile: No results for input(s): "CHOL", "HDL", "LDLCALC", "TRIG", "CHOLHDL", "LDLDIRECT" in the last 72 hours. Thyroid function studies: Recent Labs    05/03/22 1246  TSH 0.563   Anemia work up: No results for input(s): "VITAMINB12", "FOLATE", "FERRITIN", "TIBC", "IRON", "RETICCTPCT" in the last 72 hours. Sepsis Labs: Recent Labs  Lab 05/03/22 1246 05/03/22 1248 05/03/22 1440 05/04/22 0658 05/05/22 0656  PROCALCITON <0.10  --   --  <0.10 <0.10  WBC 4.8  --   --  3.6* 4.0  LATICACIDVEN  --  3.5* 1.9  --   --     Microbiology Recent Results (from the past 240 hour(s))  Blood Culture (routine x 2)     Status: None (Preliminary result)   Collection Time: 05/03/22 12:48 PM   Specimen: BLOOD  Result Value Ref Range Status   Specimen Description BLOOD RIGHT WRIST  Final   Special Requests   Final    BOTTLES DRAWN AEROBIC AND ANAEROBIC Blood Culture adequate volume   Culture   Final    NO GROWTH 2 DAYS Performed at [facility], [address] [place], [place] [address]    Report Status PENDING  Incomplete  Blood Culture (routine x 2)     Status: None (Preliminary result)   Collection Time: 05/03/22 12:55 PM   Specimen: BLOOD  Result Value Ref Range Status   Specimen Description BLOOD LEFT AC  Final   Special Requests   Final    BOTTLES DRAWN AEROBIC AND ANAEROBIC Blood Culture adequate volume    Culture   Final    NO GROWTH 2 DAYS Performed at [facility], [address] [place], [place] [address]    Report Status PENDING  Incomplete  Urine Culture     Status: Abnormal (Preliminary result)   Collection Time: 05/03/22  2:40 PM   Specimen: In/Out Cath Urine  Result Value Ref Range Status   Specimen Description IN/OUT CATH URINE  Final   Special Requests NONE  Final   Culture (A)  Final    >=100,000 COLONIES/mL ESCHERICHIA COLI CULTURE REINCUBATED FOR BETTER GROWTH    Report Status PENDING  Incomplete   Organism ID, Bacteria ESCHERICHIA COLI (A)  Final      Susceptibility   Escherichia coli - MIC*    AMPICILLIN >=32 RESISTANT Resistant     CEFAZOLIN <=4 SENSITIVE Sensitive     CEFEPIME <=0.12 SENSITIVE Sensitive     CEFTRIAXONE <=0.25 SENSITIVE Sensitive     CIPROFLOXACIN <=0.25 SENSITIVE Sensitive     GENTAMICIN <=1 SENSITIVE Sensitive     IMIPENEM <=0.25 SENSITIVE Sensitive     NITROFURANTOIN <=16 SENSITIVE Sensitive     TRIMETH/SULFA >=320 RESISTANT Resistant     AMPICILLIN/SULBACTAM >=32 RESISTANT Resistant     PIP/TAZO Value in next row Intermediate      64 INTERMEDIATEPerformed at [facility], [address] [place], [place] [address]    * >=100,000 COLONIES/mL ESCHERICHIA COLI    Procedures and diagnostic studies:  CT ABDOMEN PELVIS W CONTRAST  Result Date:  05/03/2022 CLINICAL DATA:  Incoherent, positive sepsis screening, strong smell of urine, history frequent UTI, COPD, hypertension EXAM: CT ANGIOGRAPHY CHEST CT ABDOMEN AND PELVIS WITH CONTRAST TECHNIQUE: Multidetector CT imaging of the chest was performed using the standard protocol during bolus administration of intravenous contrast. Multiplanar CT image reconstructions and MIPs were obtained to evaluate the vascular anatomy. Multidetector CT imaging of the abdomen and pelvis was performed using the standard protocol during bolus administration of intravenous contrast. RADIATION DOSE  REDUCTION: This exam was performed according to the departmental dose-optimization program which includes automated exposure control, adjustment of the mA and/or kV according to patient size and/or use of iterative reconstruction technique. CONTRAST:  OMNIPAQUE IOHEXOL 350 MG/ML SOLN IV. No oral contrast. COMPARISON:  CT chest 09/13/2010, CT abdomen and pelvis 05/01/2009 FINDINGS: CTA CHEST FINDINGS Cardiovascular: Atherosclerotic calcifications aorta, proximal great vessels, and coronary arteries. 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 No evidence of aortic dissection. Enlargement of cardiac chambers. No pericardial effusion. Pulmonary arteries adequately opacified the scattered beam hardening artifacts are present. No definite evidence of pulmonary embolism. Mediastinum/Nodes: Base of cervical region normal appearance. Esophagus unremarkable. No thoracic adenopathy. Lungs/Pleura: Decreased lung volumes. Paraseptal emphysema. Scattered subpleural atelectasis. Minimal dependent atelectasis posterior lower lobes. No acute infiltrate, pleural effusion or pneumothorax. Musculoskeletal: Unremarkable Review of the MIP images confirms the above findings. CT ABDOMEN and PELVIS FINDINGS Hepatobiliary: Calcified gallstones within gallbladder. Nodular cirrhotic appearing liver without focal mass. No biliary dilatation. Pancreas: Normal appearance Spleen: Capsular calcification at lateral spleen consistent with sequela of prior subcapsular hematoma as noted on prior study. No other focal splenic abnormalities. Adrenals/Urinary Tract: Adrenal glands normal appearance. Tiny LEFT renal cyst; no follow-up imaging recommended. Patchy RIGHT nephrogram question pyelonephritis. No hydronephrosis, hydroureter, or ureteral calcification. Bladder unremarkable. Stomach/Bowel: Appendix surgically absent by history. Stomach decompressed with small hiatal hernia. Mobile cecum extending across midline  of upper pelvis with questionable mild wall thickening, colitis not excluded. Small bowel loops unremarkable. No evidence of bowel obstruction. Vascular/Lymphatic: Atherosclerotic calcifications aorta and iliac arteries without aneurysm. No adenopathy. Reproductive: Atrophic uterus with unremarkable adnexa Other: Small amount of nonspecific free pelvic fluid as well as free fluid adjacent to liver. No free air. No hernia. Musculoskeletal: Diffuse osseous demineralization. Review of the MIP images confirms the above findings. IMPRESSION: No definite evidence of pulmonary embolism. Cirrhotic appearing liver with small volume ascites. Cholelithiasis. Patchy RIGHT nephrogram question pyelonephritis; recommend correlation with urinalysis. Small hiatal hernia. 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 This recommendation follows 2010 ACCF/AHA/AATS/ACR/ASA/SCA/SCAI/SIR/STS/SVM Guidelines for the Diagnosis and Management of Patients with Thoracic Aortic Disease. Circulation. 2010; 121: X528-U132. Aortic aneurysm NOS (ICD10-I71.9) Aortic Atherosclerosis (ICD10-I70.0) Emphysema (ICD10-J43.9). Aortic aneurysm NOS (ICD10-I71.9). Electronically Signed   By: Ulyses Southward M.D.   On: 05/03/2022 14:57   CT Angio Chest PE W and/or Wo Contrast  Result Date: 05/03/2022 CLINICAL DATA:  Incoherent, positive sepsis screening, strong smell of urine, history frequent UTI, COPD, hypertension EXAM: CT ANGIOGRAPHY CHEST CT ABDOMEN AND PELVIS WITH CONTRAST TECHNIQUE: Multidetector CT imaging of the chest was performed using the standard protocol during bolus administration of intravenous contrast. Multiplanar CT image reconstructions and MIPs were obtained to evaluate the vascular anatomy. Multidetector CT imaging of the abdomen and pelvis was performed using the standard protocol during bolus  administration of intravenous contrast. RADIATION DOSE REDUCTION: This exam was performed according to the departmental dose-optimization  program which includes automated exposure control, adjustment of the mA and/or kV according to patient size and/or use of iterative reconstruction technique. CONTRAST:  OMNIPAQUE IOHEXOL 350 MG/ML SOLN IV. No oral contrast. COMPARISON:  CT chest 09/13/2010, CT abdomen and pelvis 05/01/2009 FINDINGS: CTA CHEST FINDINGS Cardiovascular: Atherosclerotic calcifications aorta, proximal great vessels, and coronary arteries. 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 No evidence of aortic dissection. Enlargement of cardiac chambers. No pericardial effusion. Pulmonary arteries adequately opacified the scattered beam hardening artifacts are present. No definite evidence of pulmonary embolism. Mediastinum/Nodes: Base of cervical region normal appearance. Esophagus unremarkable. No thoracic adenopathy. Lungs/Pleura: Decreased lung volumes. Paraseptal emphysema. Scattered subpleural atelectasis. Minimal dependent atelectasis posterior lower lobes. No acute infiltrate, pleural effusion or pneumothorax. Musculoskeletal: Unremarkable Review of the MIP images confirms the above findings. CT ABDOMEN and PELVIS FINDINGS Hepatobiliary: Calcified gallstones within gallbladder. Nodular cirrhotic appearing liver without focal mass. No biliary dilatation. Pancreas: Normal appearance Spleen: Capsular calcification at lateral spleen consistent with sequela of prior subcapsular hematoma as noted on prior study. No other focal splenic abnormalities. Adrenals/Urinary Tract: Adrenal glands normal appearance. Tiny LEFT renal cyst; no follow-up imaging recommended. Patchy RIGHT nephrogram question pyelonephritis. No hydronephrosis, hydroureter, or ureteral calcification. Bladder unremarkable. Stomach/Bowel: Appendix surgically absent by history. Stomach decompressed with  small hiatal hernia. Mobile cecum extending across midline of upper pelvis with questionable mild wall thickening, colitis not excluded. Small bowel loops unremarkable. No evidence of bowel obstruction. Vascular/Lymphatic: Atherosclerotic calcifications aorta and iliac arteries without aneurysm. No adenopathy. Reproductive: Atrophic uterus with unremarkable adnexa Other: Small amount of nonspecific free pelvic fluid as well as free fluid adjacent to liver. No free air. No hernia. Musculoskeletal: Diffuse osseous demineralization. Review of the MIP images confirms the above findings. IMPRESSION: No definite evidence of pulmonary embolism. Cirrhotic appearing liver with small volume ascites. Cholelithiasis. Patchy RIGHT nephrogram question pyelonephritis; recommend correlation with urinalysis. Small hiatal hernia. 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 This recommendation follows 2010 ACCF/AHA/AATS/ACR/ASA/SCA/SCAI/SIR/STS/SVM Guidelines for the Diagnosis and Management of Patients with Thoracic Aortic Disease. Circulation. 2010; 121: J191-Y782. Aortic aneurysm NOS (ICD10-I71.9) Aortic Atherosclerosis (ICD10-I70.0) Emphysema (ICD10-J43.9). Aortic aneurysm NOS (ICD10-I71.9). Electronically Signed   By: Ulyses Southward M.D.   On: 05/03/2022 14:57   CT HEAD WO CONTRAST ( )  Result Date: 05/03/2022 CLINICAL DATA:  Mental status change.  Possible sepsis EXAM: CT HEAD WITHOUT CONTRAST TECHNIQUE: Contiguous axial images were obtained from the base of the skull through the vertex without intravenous contrast. RADIATION DOSE REDUCTION: This exam was performed according to the departmental dose-optimization program which includes automated exposure control, adjustment of the mA and/or kV according to patient size and/or use of iterative reconstruction technique. COMPARISON:   None Available. FINDINGS: Brain: Mild hypodensity in the white matter bilaterally most consistent with chronic microvascular ischemia. Ventricle size and cerebral volume normal Negative for acute infarct, hemorrhage, mass Vascular: Negative for hyperdense vessel Skull: Negative Sinuses/Orbits: Paranasal sinuses clear.  Negative orbit Other: None IMPRESSION: No acute abnormality. Chronic microvascular ischemic changes in the white matter. Electronically Signed   By: Marlan Palau M.D.   On: 05/03/2022 14:44               LOS: 0 days   Latamara Melder  [facility]   Pager on [email or web address]. If 7PM-7AM, please contact night-coverage at [email or web address]     05/05/2022, 2:24 PM

## 2022-05-05 NOTE — Plan of Care (Signed)
  Problem: Activity: Goal: Risk for activity intolerance will decrease Outcome: Progressing   Problem: Nutrition: Goal: Adequate nutrition will be maintained Outcome: Progressing   Problem: Elimination: Goal: Will not experience complications related to bowel motility Outcome: Progressing Goal: Will not experience complications related to urinary retention Outcome: Progressing   

## 2022-05-06 DIAGNOSIS — R6520 Severe sepsis without septic shock: Secondary | ICD-10-CM | POA: Diagnosis not present

## 2022-05-06 DIAGNOSIS — K7682 Hepatic encephalopathy: Secondary | ICD-10-CM | POA: Diagnosis not present

## 2022-05-06 DIAGNOSIS — N10 Acute tubulo-interstitial nephritis: Secondary | ICD-10-CM | POA: Diagnosis not present

## 2022-05-06 DIAGNOSIS — A4151 Sepsis due to Escherichia coli [E. coli]: Secondary | ICD-10-CM | POA: Diagnosis not present

## 2022-05-06 LAB — URINE CULTURE: Culture: >=100000 — AB

## 2022-05-06 LAB — AMMONIA: Ammonia: 59 umol/L — ABNORMAL HIGH (ref 9–35)

## 2022-05-06 MED ORDER — LEVOFLOXACIN 750 MG PO TABS
750.0000 mg | ORAL_TABLET | Freq: Every day | ORAL | Status: DC
Start: 2022-05-06 — End: 2022-05-07
  Administered 2022-05-06 – 2022-05-07 (×2): 750 mg via ORAL
  Filled 2022-05-06 (×2): qty 1

## 2022-05-06 MED ORDER — GABAPENTIN 300 MG PO CAPS
900.0000 mg | ORAL_CAPSULE | Freq: Every day | ORAL | Status: DC
  Administered 2022-05-06: 900 mg via ORAL
  Filled 2022-05-06: qty 3

## 2022-05-06 MED ORDER — GABAPENTIN 300 MG PO CAPS
300.0000 mg | ORAL_CAPSULE | Freq: Every day | ORAL | Status: DC
  Administered 2022-05-07: 300 mg via ORAL
  Filled 2022-05-06: qty 1

## 2022-05-06 NOTE — TOC Initial Note (Signed)
Transition of Care [facility]) - Initial/Assessment Note    Patient Details  Name: Carla Huerta MRN: [id number] Date of Birth: 09-08-1951  Transition of Care [facility]) CM/SW Contact:    Margarito Liner, LCSW Phone Number: 05/06/2022, 2:23 PM  Clinical Narrative:  CSW met with patient. No supports at bedside. CSW introduced role and explained that PT recommendations would be discussed. Patient is agreeable to SNF placement. No facility preference. Will follow up with bed offers once available. No further concerns. CSW encouraged patient to contact CSW as needed. CSW will continue to follow patient for support and facilitate discharge to SNF once medically stable.                Expected Discharge Plan: Skilled Nursing Facility Barriers to Discharge: Continued Medical Work up   Patient Goals and CMS Choice   CMS Medicare.gov Compare Post Acute Care list provided to:: Patient    Expected Discharge Plan and Services Expected Discharge Plan: Skilled Nursing Facility     Post Acute Care Choice: Skilled Nursing Facility Living arrangements for the past 2 months: Single Family Home                                      Prior Living Arrangements/Services Living arrangements for the past 2 months: Single Family Home Lives with:: Spouse Patient language and need for interpreter reviewed:: Yes Do you feel safe going back to the place where you live?: Yes      Need for Family Participation in Patient Care: Yes (Comment) Care giver support system in place?: Yes (comment)   Criminal Activity/Legal Involvement Pertinent to Current Situation/Hospitalization: No - Comment as needed  Activities of Daily Living Home Assistive Devices/Equipment: Dan Humphreys (specify type) ADL Screening (condition at time of admission) Patient's cognitive ability adequate to safely complete daily activities?: Yes Is the patient deaf or have difficulty hearing?: No Does the patient have difficulty seeing, even  when wearing glasses/contacts?: No Does the patient have difficulty concentrating, remembering, or making decisions?: Yes Patient able to express need for assistance with ADLs?: Yes Does the patient have difficulty dressing or bathing?: No Independently performs ADLs?: Yes (appropriate for developmental age) Does the patient have difficulty walking or climbing stairs?: Yes Weakness of Legs: Both Weakness of Arms/Hands: Both  Permission Sought/Granted Permission sought to share information with : Facility Industrial/product designer granted to share information with : Yes, Verbal Permission Granted     Permission granted to share info w AGENCY: SNF's        Emotional Assessment Appearance:: Appears stated age Attitude/Demeanor/Rapport: Engaged, Gracious Affect (typically observed): Accepting, Appropriate, Calm, Pleasant Orientation: : Oriented to Self, Oriented to Place, Oriented to  Time, Oriented to Situation Alcohol / Substance Use: Not Applicable Psych Involvement: No (comment)  Admission diagnosis:  Lactic acidosis [E87.20] Hypomagnesemia [E83.42] Pyelonephritis [N12] Sepsis (HCC) [A41.9] Sepsis, due to unspecified organism, unspecified whether acute organ dysfunction present [facility]) [A41.9] Abdominal aortic aneurysm (AAA) without rupture, unspecified part (HCC) [I71.40] Acute hepatic encephalopathy (HCC) [K76.82] COPD exacerbation (HCC) [J44.1] Patient Active Problem List   Diagnosis Date Noted   COPD exacerbation (HCC) 05/06/2022   Acute hepatic encephalopathy (HCC) 05/04/2022   Sepsis (HCC) 05/03/2022   Acute pyelonephritis 05/03/2022   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

## 2022-05-07 DIAGNOSIS — A4151 Sepsis due to Escherichia coli [E. coli]: Secondary | ICD-10-CM | POA: Diagnosis not present

## 2022-05-07 DIAGNOSIS — J9611 Chronic respiratory failure with hypoxia: Secondary | ICD-10-CM | POA: Diagnosis present

## 2022-05-07 DIAGNOSIS — N10 Acute tubulo-interstitial nephritis: Secondary | ICD-10-CM | POA: Diagnosis not present

## 2022-05-07 DIAGNOSIS — K7682 Hepatic encephalopathy: Secondary | ICD-10-CM | POA: Diagnosis not present

## 2022-05-07 DIAGNOSIS — G9341 Metabolic encephalopathy: Secondary | ICD-10-CM | POA: Diagnosis not present

## 2022-05-07 LAB — CBC
HCT: 33.3 % — ABNORMAL LOW (ref 36.0–46.0)
Hemoglobin: 10.5 g/dL — ABNORMAL LOW (ref 12.0–15.0)
MCH: 30.5 pg (ref 26.0–34.0)
MCHC: 31.5 g/dL (ref 30.0–36.0)
MCV: 96.8 fL (ref 80.0–100.0)
Platelets: 101 10*3/uL — ABNORMAL LOW (ref 150–400)
RBC: 3.44 MIL/uL — ABNORMAL LOW (ref 3.87–5.11)
RDW: 15.3 % (ref 11.5–15.5)
WBC: 4.1 10*3/uL (ref 4.0–10.5)
nRBC: 0 % (ref 0.0–0.2)

## 2022-05-07 LAB — BASIC METABOLIC PANEL
Anion gap: 6 (ref 5–15)
BUN: 15 mg/dL (ref 8–23)
CO2: 26 mmol/L (ref 22–32)
Calcium: 9.2 mg/dL (ref 8.9–10.3)
Chloride: 108 mmol/L (ref 98–111)
Creatinine, Ser: 1.05 mg/dL — ABNORMAL HIGH (ref 0.44–1.00)
GFR, Estimated: 58 mL/min — ABNORMAL LOW (ref >60)
Glucose, Bld: 89 mg/dL (ref 70–99)
Potassium: 3.6 mmol/L (ref 3.5–5.1)
Sodium: 140 mmol/L (ref 135–145)

## 2022-05-07 LAB — MAGNESIUM: Magnesium: 1.9 mg/dL (ref 1.7–2.4)

## 2022-05-07 MED ORDER — GABAPENTIN 300 MG PO CAPS: 900.0000 mg | ORAL_CAPSULE | Freq: Every day | ORAL | Status: DC

## 2022-05-07 MED ORDER — GABAPENTIN 300 MG PO CAPS: 300.0000 mg | ORAL_CAPSULE | Freq: Every day | ORAL | Status: DC

## 2022-05-07 MED ORDER — LACTULOSE 10 GM/15ML PO SOLN: 20.0000 g | Freq: Two times a day (BID) | ORAL | 0 refills | Status: DC

## 2022-05-07 MED ORDER — LEVOFLOXACIN 750 MG PO TABS: 750.0000 mg | ORAL_TABLET | Freq: Every day | ORAL | Status: DC

## 2022-05-07 MED ORDER — RIFAXIMIN 550 MG PO TABS: 550.0000 mg | ORAL_TABLET | Freq: Two times a day (BID) | ORAL | Status: DC

## 2022-05-07 NOTE — Progress Notes (Signed)
Report given to [employer], Charity fundraiser at [employer] .VSS.

## 2022-05-07 NOTE — Plan of Care (Signed)

## 2022-05-08 LAB — CULTURE, BLOOD (ROUTINE X 2)
Culture: NO GROWTH
Culture: NO GROWTH
Special Requests: ADEQUATE
Special Requests: ADEQUATE

## 2022-06-24 ENCOUNTER — Other Ambulatory Visit: Payer: Self-pay | Admitting: Family Medicine

## 2022-06-24 DIAGNOSIS — Z1231 Encounter for screening mammogram for malignant neoplasm of breast: Secondary | ICD-10-CM

## 2022-07-22 ENCOUNTER — Ambulatory Visit
Admission: RE | Admit: 2022-07-22 | Discharge: 2022-07-22 | Disposition: A | Discharge status: Home / Self Care | Payer: Medicare HMO | Source: Ambulatory Visit | Attending: Family Medicine | Admitting: Family Medicine

## 2022-07-22 DIAGNOSIS — Z1231 Encounter for screening mammogram for malignant neoplasm of breast: Secondary | ICD-10-CM | POA: Insufficient documentation

## 2022-10-30 ENCOUNTER — Emergency Department: Payer: Medicare HMO

## 2022-10-30 ENCOUNTER — Other Ambulatory Visit: Payer: Self-pay

## 2022-10-30 ENCOUNTER — Observation Stay
Admission: EM | Admit: 2022-10-30 | Discharge: 2022-10-31 | Disposition: A | Discharge status: Home / Self Care | Payer: Medicare HMO | Attending: Internal Medicine | Admitting: Internal Medicine

## 2022-10-30 DIAGNOSIS — D61818 Other pancytopenia: Secondary | ICD-10-CM | POA: Diagnosis not present

## 2022-10-30 DIAGNOSIS — K7460 Unspecified cirrhosis of liver: Secondary | ICD-10-CM | POA: Insufficient documentation

## 2022-10-30 DIAGNOSIS — Z7982 Long term (current) use of aspirin: Secondary | ICD-10-CM | POA: Insufficient documentation

## 2022-10-30 DIAGNOSIS — R0602 Shortness of breath: Secondary | ICD-10-CM | POA: Diagnosis present

## 2022-10-30 DIAGNOSIS — K449 Diaphragmatic hernia without obstruction or gangrene: Secondary | ICD-10-CM | POA: Insufficient documentation

## 2022-10-30 DIAGNOSIS — Z87891 Personal history of nicotine dependence: Secondary | ICD-10-CM | POA: Insufficient documentation

## 2022-10-30 DIAGNOSIS — Z79899 Other long term (current) drug therapy: Secondary | ICD-10-CM | POA: Insufficient documentation

## 2022-10-30 DIAGNOSIS — J449 Chronic obstructive pulmonary disease, unspecified: Secondary | ICD-10-CM | POA: Insufficient documentation

## 2022-10-30 DIAGNOSIS — Z96651 Presence of right artificial knee joint: Secondary | ICD-10-CM | POA: Insufficient documentation

## 2022-10-30 DIAGNOSIS — I110 Hypertensive heart disease with heart failure: Secondary | ICD-10-CM | POA: Diagnosis not present

## 2022-10-30 DIAGNOSIS — I509 Heart failure, unspecified: Secondary | ICD-10-CM | POA: Diagnosis not present

## 2022-10-30 DIAGNOSIS — G4730 Sleep apnea, unspecified: Secondary | ICD-10-CM | POA: Diagnosis present

## 2022-10-30 LAB — CBC
HCT: 27.8 % — ABNORMAL LOW (ref 36.0–46.0)
HCT: 27.6 % — ABNORMAL LOW (ref 36.0–46.0)
Hemoglobin: 7.6 g/dL — ABNORMAL LOW (ref 12.0–15.0)
Hemoglobin: 7.7 g/dL — ABNORMAL LOW (ref 12.0–15.0)
MCH: 23.3 pg — ABNORMAL LOW (ref 26.0–34.0)
MCH: 23.6 pg — ABNORMAL LOW (ref 26.0–34.0)
MCHC: 27.3 g/dL — ABNORMAL LOW (ref 30.0–36.0)
MCHC: 27.9 g/dL — ABNORMAL LOW (ref 30.0–36.0)
MCV: 85.3 fL (ref 80.0–100.0)
MCV: 84.7 fL (ref 80.0–100.0)
Platelets: 145 10*3/uL — ABNORMAL LOW (ref 150–400)
Platelets: 170 10*3/uL (ref 150–400)
RBC: 3.26 MIL/uL — ABNORMAL LOW (ref 3.87–5.11)
RBC: 3.26 MIL/uL — ABNORMAL LOW (ref 3.87–5.11)
RDW: 26.4 % — ABNORMAL HIGH (ref 11.5–15.5)
RDW: 26.5 % — ABNORMAL HIGH (ref 11.5–15.5)
WBC: 3.6 10*3/uL — ABNORMAL LOW (ref 4.0–10.5)
WBC: 3.4 10*3/uL — ABNORMAL LOW (ref 4.0–10.5)
nRBC: 0 % (ref 0.0–0.2)
nRBC: 0 % (ref 0.0–0.2)

## 2022-10-30 LAB — URINALYSIS, ROUTINE W REFLEX MICROSCOPIC
Bilirubin Urine: NEGATIVE
Glucose, UA: NEGATIVE mg/dL
Hgb urine dipstick: NEGATIVE
Ketones, ur: NEGATIVE mg/dL
Nitrite: NEGATIVE
Protein, ur: NEGATIVE mg/dL
Specific Gravity, Urine: 1.005 (ref 1.005–1.030)
pH: 6 (ref 5.0–8.0)

## 2022-10-30 LAB — COMPREHENSIVE METABOLIC PANEL
ALT: 27 U/L (ref 0–44)
AST: 52 U/L — ABNORMAL HIGH (ref 15–41)
Albumin: 3.3 g/dL — ABNORMAL LOW (ref 3.5–5.0)
Alkaline Phosphatase: 91 U/L (ref 38–126)
Anion gap: 10 (ref 5–15)
BUN: 15 mg/dL (ref 8–23)
CO2: 24 mmol/L (ref 22–32)
Calcium: 9.2 mg/dL (ref 8.9–10.3)
Chloride: 105 mmol/L (ref 98–111)
Creatinine, Ser: 1.1 mg/dL — ABNORMAL HIGH (ref 0.44–1.00)
GFR, Estimated: 54 mL/min — ABNORMAL LOW (ref >60)
Glucose, Bld: 107 mg/dL — ABNORMAL HIGH (ref 70–99)
Potassium: 4.3 mmol/L (ref 3.5–5.1)
Sodium: 139 mmol/L (ref 135–145)
Total Bilirubin: 1.1 mg/dL (ref 0.3–1.2)
Total Protein: 7 g/dL (ref 6.5–8.1)

## 2022-10-30 LAB — PROTIME-INR
INR: 1.1 (ref 0.8–1.2)
Prothrombin Time: 14.4 seconds (ref 11.4–15.2)

## 2022-10-30 LAB — TYPE AND SCREEN

## 2022-10-30 LAB — AMMONIA: Ammonia: 42 umol/L — ABNORMAL HIGH (ref 9–35)

## 2022-10-30 LAB — BRAIN NATRIURETIC PEPTIDE: B Natriuretic Peptide: 155.4 pg/mL — ABNORMAL HIGH (ref 0.0–100.0)

## 2022-10-30 LAB — TROPONIN I (HIGH SENSITIVITY)
Troponin I (High Sensitivity): 6 ng/L (ref <18)
Troponin I (High Sensitivity): 6 ng/L (ref <18)

## 2022-10-30 LAB — LIPASE, BLOOD: Lipase: 41 U/L (ref 11–51)

## 2022-10-30 MED ORDER — SPIRONOLACTONE 25 MG PO TABS
25.0000 mg | ORAL_TABLET | Freq: Every day | ORAL | Status: DC
  Administered 2022-10-31: 25 mg via ORAL
  Filled 2022-10-30: qty 1

## 2022-10-30 MED ORDER — ACETAMINOPHEN 325 MG PO TABS: 650.0000 mg | ORAL_TABLET | Freq: Four times a day (QID) | ORAL | Status: DC | PRN

## 2022-10-30 MED ORDER — ONDANSETRON HCL 4 MG PO TABS: 4.0000 mg | ORAL_TABLET | Freq: Four times a day (QID) | ORAL | Status: DC | PRN

## 2022-10-30 MED ORDER — ACETAMINOPHEN 650 MG RE SUPP: 650.0000 mg | Freq: Four times a day (QID) | RECTAL | Status: DC | PRN

## 2022-10-30 MED ORDER — FUROSEMIDE 10 MG/ML IJ SOLN
40.0000 mg | Freq: Once | INTRAMUSCULAR | Status: AC
  Administered 2022-10-30: 40 mg via INTRAVENOUS
  Filled 2022-10-30: qty 4

## 2022-10-30 MED ORDER — FESOTERODINE FUMARATE ER 4 MG PO TB24
4.0000 mg | ORAL_TABLET | Freq: Every day | ORAL | Status: DC
  Administered 2022-10-31: 4 mg via ORAL
  Filled 2022-10-30: qty 1

## 2022-10-30 MED ORDER — FUROSEMIDE 10 MG/ML IJ SOLN
40.0000 mg | Freq: Every day | INTRAMUSCULAR | Status: DC
  Administered 2022-10-31: 40 mg via INTRAVENOUS
  Filled 2022-10-30: qty 4

## 2022-10-30 MED ORDER — RIFAXIMIN 550 MG PO TABS
550.0000 mg | ORAL_TABLET | Freq: Two times a day (BID) | ORAL | Status: DC
  Administered 2022-10-31 (×2): 550 mg via ORAL
  Filled 2022-10-30 (×3): qty 1

## 2022-10-30 MED ORDER — LISINOPRIL 10 MG PO TABS
10.0000 mg | ORAL_TABLET | Freq: Every day | ORAL | Status: DC
  Administered 2022-10-31: 10 mg via ORAL
  Filled 2022-10-30: qty 1

## 2022-10-30 MED ORDER — PANTOPRAZOLE SODIUM 40 MG PO TBEC
40.0000 mg | DELAYED_RELEASE_TABLET | Freq: Every day | ORAL | Status: DC
  Administered 2022-10-31: 40 mg via ORAL
  Filled 2022-10-30: qty 1

## 2022-10-30 MED ORDER — FUROSEMIDE 10 MG/ML IJ SOLN: 40.0000 mg | Freq: Every day | INTRAMUSCULAR | Status: DC

## 2022-10-30 MED ORDER — ONDANSETRON HCL 4 MG/2ML IJ SOLN: 4.0000 mg | Freq: Four times a day (QID) | INTRAMUSCULAR | Status: DC | PRN

## 2022-10-30 MED ORDER — LACTULOSE 10 GM/15ML PO SOLN
20.0000 g | Freq: Two times a day (BID) | ORAL | Status: DC
  Administered 2022-10-31: 20 g via ORAL
  Filled 2022-10-30: qty 30

## 2022-10-30 NOTE — ED Notes (Signed)
Pt placed on 2L [place] at this time to manage until CPAP applied for nighttime. Respiratory called to initiate.

## 2022-10-30 NOTE — H&P (Signed)
History and Physical    Patient: Carla Huerta DOB: 1951-04-29 DOA: 10/30/2022 DOS: the patient was seen and examined on 10/30/2022 PCP: Hillery Aldo, MD  Patient coming from: Home  Chief Complaint:  Chief Complaint  Patient presents with   Fall   Leg Swelling   HPI: Carla Huerta is a 71 y.o. female with medical history significant of normocytic anemia, COPD, chronic cough, diaphragm paralysis, depression, gout, hypertension, hyponatremia, nonalcoholic hepatic steatosis and cirrhosis, sleep apnea on CPAP, splenic laceration, vertigo, unspecified renal disorder, was brought to the emergency department due to generalized weakness, progressively worsening dyspnea, bilateral lower extremity edema, multiple falls and questionable UTI.  She does not have any urinary symptoms.  She has been drinking a lot of water and she adds salt to her meals. No chest pain, palpitations, diaphoresis, PND, orthopnea, however the patient sleeps with CPAP.  He denied fever, chills, rhinorrhea, sore throat, wheezing or hemoptysis.  No abdominal pain, nausea, emesis, diarrhea, constipation, melena or hematochezia.  No flank pain, dysuria, frequency or hematuria.  No polyuria, polydipsia, polyphagia or blurred vision.   ED course: Initial vital signs were temperature 98.6 F, pulse 86, respirations 16, BP 129/72 mmHg O2 sat 96% on room air.  The patient received furosemide 40 mg IVP.  Lab work: Urinalysis shows small leukocyte esterase and many bacteria.  CBC showed a white count 3.6, hemoglobin 7.6 g/dL platelets 453.  Her most recent hemoglobin level was 10.5 g/dL almost 6 months.  BNP 855.4 pg/mL.  Troponin x 2 normal.  Lipase is normal.  Ammonia was 42 mol/L.  Imaging: Chest radiograph shows cardiomegaly and pulmonary edema.   Review of Systems: As mentioned in the history of present illness. All other systems reviewed and are negative. Past Medical History:  Diagnosis Date   Anemia    COPD  (chronic obstructive pulmonary disease) (HCC)    Cough    Depression    Diaphragm paralysis    Gout    Hypertension    Hyponatremia    Insomnia    Liver disease    Nonalcoholic hepatosteatosis    PONV (postoperative nausea and vomiting)    Renal disorder    Sleep apnea    Splenic laceration    Vertigo    Past Surgical History:  Procedure Laterality Date   APPENDECTOMY     when she was 82 yearsa old   BREAST BIOPSY Left 06/17/2019   affirm bx distortion with calcs, coil clip, ADH   BREAST EXCISIONAL BIOPSY Left 09/23/2019   neg   BREAST LUMPECTOMY Left 09/23/2019   ADH   BREAST LUMPECTOMY WITH NEEDLE LOCALIZATION Left 09/23/2019   Procedure: LEFT BREAST LUMPECTOMY WITH NEEDLE LOCALIZATION;  Surgeon: Griselda Miner, MD;  Location: [facility];  Service: General;  Laterality: Left;   COLONOSCOPY WITH PROPOFOL N/A 10/01/2016   Procedure: COLONOSCOPY WITH PROPOFOL;  Surgeon: Midge Minium, MD;  Location: [facility];  Service: Endoscopy;  Laterality: N/A;   ESOPHAGOGASTRODUODENOSCOPY (EGD) WITH PROPOFOL N/A 10/01/2016   Procedure: ESOPHAGOGASTRODUODENOSCOPY (EGD) WITH PROPOFOL;  Surgeon: Midge Minium, MD;  Location: [facility];  Service: Endoscopy;  Laterality: N/A;   LIVER BIOPSY     2015   REPLACEMENT TOTAL KNEE Right 2002   TONSILLECTOMY AND ADENOIDECTOMY     when pt was 71 years old   Social History:  reports that she has quit smoking. She uses smokeless tobacco. She reports that she does not drink alcohol and does not use drugs.  No Known  Allergies  Family History  Problem Relation Age of Onset   Breast cancer Sister    Breast cancer Paternal Grandmother    Breast cancer Other     Prior to Admission medications   Medication Sig Start Date End Date Taking? Authorizing Provider  albuterol (PROVENTIL HFA;VENTOLIN HFA) 108 (90 Base) MCG/ACT inhaler Inhale 2 puffs into the lungs every 4 (four) hours as needed for wheezing or shortness of breath.    [provider]  alendronate (FOSAMAX) 70 MG tablet Take 70 mg by mouth every Wednesday.  08/12/19   [provider]  allopurinol (ZYLOPRIM) 100 MG tablet Take 100 mg by mouth daily. 04/11/22   [provider]  aspirin 81 MG chewable tablet Chew 81 mg by mouth at bedtime.    [provider]  atorvastatin (LIPITOR) 40 MG tablet Take 40 mg by mouth daily.     [provider]  cholecalciferol (VITAMIN D) 1000 units tablet Take 1,000 Units by mouth daily.    [provider]  COLCRYS 0.6 MG tablet Take 0.6 mg by mouth at bedtime.    [provider]  furosemide (LASIX) 20 MG tablet Take 20 mg by mouth every Monday, Wednesday, and Friday.     [provider]  gabapentin (NEURONTIN) 300 MG capsule Take 1 capsule (300 mg total) by mouth daily. 05/08/22   Lurene Shadow, MD  gabapentin (NEURONTIN) 300 MG capsule Take 3 capsules (900 mg total) by mouth at bedtime. 05/07/22   Lurene Shadow, MD  imipramine (TOFRANIL) 50 MG tablet Take 100 mg by mouth at bedtime.  08/09/16   [provider]  lactulose (CHRONULAC) 10 GM/15ML solution Take 30 mLs (20 g total) by mouth 2 (two) times daily. 05/07/22   Lurene Shadow, MD  levofloxacin (LEVAQUIN) 750 MG tablet Take 1 tablet (750 mg total) by mouth daily. 05/08/22   Lurene Shadow, MD  lisinopril (PRINIVIL,ZESTRIL) 10 MG tablet Take 10 mg by mouth daily.    [provider]  omeprazole (PRILOSEC) 40 MG capsule Take 40 mg by mouth daily.     [provider]  rifaximin (XIFAXAN) 550 MG TABS tablet Take 1 tablet (550 mg total) by mouth 2 (two) times daily. 05/07/22   Lurene Shadow, MD  tolterodine (DETROL LA) 4 MG 24 hr capsule Take 4 mg by mouth at bedtime.    [provider]    Physical Exam: Vitals:   10/30/22 1204 10/30/22 1637 10/30/22 2044  BP: 129/72 124/73 136/64  Pulse: 86 82 85  Resp: 16 18 19   Temp: 98.6 F (37 C) 98.2 F (36.8 C)   TempSrc: Oral    SpO2: 96% 94% 98%    Physical Exam Vitals and nursing note reviewed.  Constitutional:      General: She is awake. She is not in acute distress.    Appearance: Normal appearance. She is obese.  HENT:     Head: Normocephalic.     Nose: No rhinorrhea.     Mouth/Throat:     Mouth: Mucous membranes are moist.  Eyes:     General: No scleral icterus.    Pupils: Pupils are equal, round, and reactive to light.  Neck:     Vascular: No JVD.  Cardiovascular:     Rate and Rhythm: Normal rate and regular rhythm.     Heart sounds: S1 normal and S2 normal.  Pulmonary:     Effort: Pulmonary effort is normal.     Breath sounds: Normal breath  sounds.  Abdominal:     General: Abdomen is protuberant. Bowel sounds are normal. There is no distension.     Palpations: Abdomen is soft.     Tenderness: There is no abdominal tenderness. There is no guarding or rebound.  Musculoskeletal:     Cervical back: Neck supple.     Right lower leg: 1+ Edema present.     Left lower leg: 1+ Edema present.  Neurological:     General: No focal deficit present.     Mental Status: She is alert and oriented to person, place, and time.  Psychiatric:        Mood and Affect: Mood normal.        Behavior: Behavior normal. Behavior is cooperative.   Data Reviewed:  Results are pending, will review when available.  Assessment and Plan: Principal Problem:   Acute CHF (congestive heart failure) (HCC) Observation/telemetry. Supplemental oxygen as needed. Sodium and fluid restriction. Continue lisinopril 10 mg p.o. daily. No beta-blocker due to acute decompensation. Continue furosemide 40 mg IVP daily. Monitor daily weights, intake and output. Monitor renal function electrolytes. Begin spironolactone 25 mg p.o. daily. Check echocardiogram. Consider cardiology evaluation. Advised to closely monitor for fluid and sodium intake.  Active Problems:   Liver cirrhosis secondary to NASH (nonalcoholic steatohepatitis) (HCC) Prednisone  Xifaxan 550 mg p.o. twice daily. Continue lactulose 20 g p.o. daily. Monitor LFTs. Follow-up with hepatology as an outpatient     Pancytopenia (HCC) Secondary to liver cirrhosis. Monitor hematocrit and hemoglobin. Transfuse as needed.    Hernia, hiatal Continue PPI.    Sleep apnea Continue CPAP at bedtime.     Advance Care Planning:   Code Status: Full Code   Consults:   Family Communication:   Severity of Illness: The appropriate patient status for this patient is OBSERVATION. Observation status is judged to be reasonable and necessary in order to provide the required intensity of service to ensure the patient's safety. The patient's presenting symptoms, physical exam findings, and initial radiographic and laboratory data in the context of their medical condition is felt to place them at decreased risk for further clinical deterioration. Furthermore, it is anticipated that the patient will be medically stable for discharge from the hospital within 2 midnights of admission.   Author: Bobette Mo, MD 10/30/2022 9:49 PM  For on call review [email or web address].   This document was prepared using Dragon voice recognition software and may contain some unintended transcription errors.

## 2022-10-30 NOTE — ED Notes (Signed)
Pt's resp reg/unlabored, skin dry and laying calmly on bed. Bed locked low, rail up and call bell within reach. Pt denies pain currently. Pt's speech clear and in full sentences. Bilateral leg swelling noted; pitting 1+.

## 2022-10-30 NOTE — ED Provider Notes (Signed)
[facility] Provider Note    Event Date/Time   First MD Initiated Contact with Patient 10/30/22 1919     (approximate)   History   Fall and Leg Swelling   HPI  Carla Huerta is a 71 y.o. female   with past medical history of COPD, hypertension, nonalcoholic hepatosteatosis, here with shortness of breath, weakness, and falls.  The patient states that over the last several days she has had progressively worsening weakness.  She actually fell twice due to generalized weakness on Friday.  She did not hit her head.  She did strike her right leg and has had some pain since then.  Patient states that over the last few days she has had some intermittent confusion and weakness.  She went to her PCP and was sent here for evaluation.  She has a history of UTIs with similar symptoms.  Denies any fevers or chills.  She states she has chronic shortness of breath due to diaphragm injury but it has been slightly worse as well.  She does have mild orthopnea.      Physical Exam   Triage Vital Signs: ED Triage Vitals  Enc Vitals Group     BP 10/30/22 1204 129/72     Pulse Rate 10/30/22 1204 86     Resp 10/30/22 1204 16     Temp 10/30/22 1204 98.6 F (37 C)     Temp Source 10/30/22 1204 Oral     SpO2 10/30/22 1204 96 %     Weight --      Height --      Head Circumference --      Peak Flow --      Pain Score 10/30/22 1208 7     Pain Loc --      Pain Edu? --      Excl. in GC? --     Most recent vital signs: Vitals:   10/30/22 1637 10/30/22 2044  BP: 124/73 136/64  Pulse: 82 85  Resp: 18 19  Temp: 98.2 F (36.8 C)   SpO2: 94% 98%     General: Awake, no distress.  CV:  Good peripheral perfusion.  Regular rate and rhythm. Resp:  Normal effort.  Bilateral rales to the mid lung bases.  No wheezing. Abd:  No distention.  No tenderness. Other:  2+ pitting edema bilateral lower extremities.   ED Results / Procedures / Treatments   Labs (all labs ordered  are listed, but only abnormal results are displayed) Labs Reviewed  COMPREHENSIVE METABOLIC PANEL - Abnormal; Notable for the following components:      Result Value   Glucose, Bld 107 (*)    Creatinine, Ser 1.10 (*)    Albumin 3.3 (*)    AST 52 (*)    GFR, Estimated 54 (*)    All other components within normal limits  CBC - Abnormal; Notable for the following components:   WBC 3.6 (*)    RBC 3.26 (*)    Hemoglobin 7.6 (*)    HCT 27.8 (*)    MCH 23.3 (*)    MCHC 27.3 (*)    RDW 26.4 (*)    Platelets 145 (*)    All other components within normal limits  URINALYSIS, ROUTINE W REFLEX MICROSCOPIC - Abnormal; Notable for the following components:   Color, Urine YELLOW (*)    APPearance HAZY (*)    Leukocytes,Ua SMALL (*)    Bacteria, UA MANY (*)  All other components within normal limits  AMMONIA - Abnormal; Notable for the following components:   Ammonia 42 (*)    All other components within normal limits  BRAIN NATRIURETIC PEPTIDE - Abnormal; Notable for the following components:   B Natriuretic Peptide 155.4 (*)    All other components within normal limits  LIPASE, BLOOD  PROTIME-INR  CBC  TYPE AND SCREEN  TYPE AND SCREEN  TROPONIN I (HIGH SENSITIVITY)  TROPONIN I (HIGH SENSITIVITY)     EKG    RADIOLOGY Chest x-ray: Acute CHF DG tib-fib right: No acute bony abnormality   I also independently reviewed and agree with radiologist interpretations.   PROCEDURES:  Critical Care performed: No    MEDICATIONS ORDERED IN ED: Medications  furosemide (LASIX) injection 40 mg (40 mg Intravenous Given 10/30/22 2032)     IMPRESSION / MDM / ASSESSMENT AND PLAN / ED COURSE  I reviewed the triage vital signs and the nursing notes.                              Differential diagnosis includes, but is not limited to, generalized weakness from occult UTI, pneumonia, anemia, ACS, CHF, worsening cirrhosis, renal failure  Patient's presentation is most consistent with  acute presentation with potential threat to life or bodily function.  71 year old female with past medical history liver disease, prior hepatic encephalopathy, hypertension, here with generalized weakness.  Clinically, the patient appears to be significantly fluid overloaded with pitting edema and bilateral rales.  Chest x-ray shows CHF.  Patient also appears to be newly acute on chronic anemia, with hemoglobin 7.6.  This is significantly down from her baseline.  She denies any bleeding.  CMP is at baseline with slight AST elevation but otherwise is reassuring.  Lipase is normal.  Ammonia slightly elevated at 42.  Urinalysis shows bacteria but no pyuria.  Suspect generalized weakness in the setting of anemia and possibly new CHF.  Will plan to admit for diuresis as well as likely echocardiogram.  Repeat hemoglobin sent to confirm.   FINAL CLINICAL IMPRESSION(S) / ED DIAGNOSES   Final diagnoses:  Acute on chronic congestive heart failure, unspecified heart failure type (HCC)     Rx / DC Orders   ED Discharge Orders     None        Note:  This document was prepared using Dragon voice recognition software and may include unintentional dictation errors.   Shaune Pollack, MD 10/30/22 2104

## 2022-10-30 NOTE — ED Triage Notes (Signed)
Pt to ED via POV for bilateral leg swelling, multiple falls, and possible UTI. Pt is having pain in her legs and her back. Pt family member states that pt has been confused and her urine has a stronger odor. Pt denies strong odor to her urine or any other urinary symptoms. Pt is able to answer all questions at this time.

## 2022-10-30 NOTE — ED Provider Triage Note (Signed)
  Emergency Medicine Provider Triage Evaluation Note  Carla Huerta , a 71 y.o.female,  was evaluated in triage.  Pt complains of bilateral leg swelling, multiple falls, and possible UTI.  Patient states that she has been having persistent swelling in both of her legs, which is caused her to fall frequently.  She is joined by family member, who states that she has become more confused and her urine has a strong odor to it.  They are concerned for urinary tract infection as well.  Patient additionally endorses some shortness of breath at this time as well.   Review of Systems  Positive: Peripheral edema Negative: Denies fever, chest pain, vomiting  Physical Exam   Vitals:   10/30/22 1204  BP: 129/72  Pulse: 86  Resp: 16  Temp: 98.6 F (37 C)  SpO2: 96%   Gen:   Awake, no distress   Resp:  Normal effort  MSK:   Moves extremities without difficulty  Other:    Medical Decision Making  Given the patient's initial medical screening exam, the following diagnostic evaluation has been ordered. The patient will be placed in the appropriate treatment space, once one is available, to complete the evaluation and treatment. I have discussed the plan of care with the patient and I have advised the patient that an ED physician or mid-level practitioner will reevaluate their condition after the test results have been received, as the results may give them additional insight into the type of treatment they may need.    Diagnostics: Labs, CXR, EKG  Treatments: none immediately   Varney Daily, [place] 10/30/22 1234

## 2022-10-31 ENCOUNTER — Observation Stay
Admit: 2022-10-31 | Discharge: 2022-10-31 | Disposition: A | Discharge status: Home / Self Care | Payer: Medicare HMO | Attending: Internal Medicine | Admitting: Internal Medicine

## 2022-10-31 ENCOUNTER — Encounter: Payer: Self-pay | Admitting: Internal Medicine

## 2022-10-31 DIAGNOSIS — K7460 Unspecified cirrhosis of liver: Secondary | ICD-10-CM | POA: Diagnosis not present

## 2022-10-31 DIAGNOSIS — K7581 Nonalcoholic steatohepatitis (NASH): Secondary | ICD-10-CM

## 2022-10-31 DIAGNOSIS — K7469 Other cirrhosis of liver: Secondary | ICD-10-CM | POA: Diagnosis present

## 2022-10-31 DIAGNOSIS — G4730 Sleep apnea, unspecified: Secondary | ICD-10-CM | POA: Diagnosis present

## 2022-10-31 LAB — COMPREHENSIVE METABOLIC PANEL WITH GFR
ALT: 23 U/L (ref 0–44)
AST: 47 U/L — ABNORMAL HIGH (ref 15–41)
Albumin: 3.2 g/dL — ABNORMAL LOW (ref 3.5–5.0)
Alkaline Phosphatase: 85 U/L (ref 38–126)
Anion gap: 9 (ref 5–15)
BUN: 15 mg/dL (ref 8–23)
CO2: 26 mmol/L (ref 22–32)
Calcium: 9 mg/dL (ref 8.9–10.3)
Chloride: 106 mmol/L (ref 98–111)
Creatinine, Ser: 1.08 mg/dL — ABNORMAL HIGH (ref 0.44–1.00)
GFR, Estimated: 55 mL/min — ABNORMAL LOW
Glucose, Bld: 84 mg/dL (ref 70–99)
Potassium: 3.7 mmol/L (ref 3.5–5.1)
Sodium: 141 mmol/L (ref 135–145)
Total Bilirubin: 1.3 mg/dL — ABNORMAL HIGH (ref 0.3–1.2)
Total Protein: 6.5 g/dL (ref 6.5–8.1)

## 2022-10-31 LAB — ECHOCARDIOGRAM COMPLETE
AR max vel: 2.55 cm2
AV Area VTI: 3.22 cm2
AV Area mean vel: 2.6 cm2
AV Mean grad: 5 mmHg
AV Peak grad: 9.5 mmHg
Ao pk vel: 1.54 m/s
Area-P 1/2: 5.42 cm2
S' Lateral: 3.1 cm

## 2022-10-31 LAB — CBC
HCT: 25.9 % — ABNORMAL LOW (ref 36.0–46.0)
Hemoglobin: 7.2 g/dL — ABNORMAL LOW (ref 12.0–15.0)
MCH: 23.1 pg — ABNORMAL LOW (ref 26.0–34.0)
MCHC: 27.8 g/dL — ABNORMAL LOW (ref 30.0–36.0)
MCV: 83 fL (ref 80.0–100.0)
Platelets: 170 10*3/uL (ref 150–400)
RBC: 3.12 MIL/uL — ABNORMAL LOW (ref 3.87–5.11)
RDW: 26.5 % — ABNORMAL HIGH (ref 11.5–15.5)
WBC: 3.1 10*3/uL — ABNORMAL LOW (ref 4.0–10.5)
nRBC: 0 % (ref 0.0–0.2)

## 2022-10-31 LAB — TYPE AND SCREEN
ABO/RH(D): O POS
Antibody Screen: NEGATIVE

## 2022-10-31 MED ORDER — SPIRONOLACTONE 25 MG PO TABS: 25.0000 mg | ORAL_TABLET | Freq: Every day | ORAL | 0 refills | Status: DC

## 2022-10-31 MED ORDER — FUROSEMIDE 40 MG PO TABS: 40.0000 mg | ORAL_TABLET | Freq: Every day | ORAL | 0 refills | Status: DC

## 2022-10-31 NOTE — ED Notes (Signed)
Pt in bed, pt able to stand with minimal assistance, pt denies pain.

## 2022-10-31 NOTE — ED Notes (Signed)
Visitor to bedside.  

## 2022-10-31 NOTE — Progress Notes (Signed)
*  PRELIMINARY RESULTS* Echocardiogram 2D Echocardiogram has been performed.  [name] 10/31/2022, 7:47 AM

## 2022-10-31 NOTE — Consult Note (Signed)
   Heart Failure Nurse Navigator Note  HFpEF 60 to 65%.  Grade 1 diastolic dysfunction.  She presented to the emergency room with complaints of progressive worsening dyspnea, bilateral lower extremity edema.  BNP 855.  Chest x-ray revealed cardiomegaly and pulmonary edema.   Comorbidities:  Mia COPD Depression Gout Hypertension Hyponatremia Obstructive sleep apnea compliant with CPAP  Medications:  Lasix 40 mg IV daily Lisinopril 10 mg daily Spironolactone 25 mg daily  Labs:  Sodium 141, potassium 3.7, chloride 106, CO2 26, BUN 15, creatinine 1.08. Weight is 135.2 kg Intake not documented Output 1600 mL   Initial meeting with patient in the ED, she is lying in bed in no acute distress.  There are no family members present at the bedside.  Patient states that she lives at home with her husband, daughter and several grandchildren.  Discussed heart failure and what it means.  She states that her daughter is the 1 who prepares the meals.  She admits to using salt at the table.  Planning the reasoning behind removing the saltshaker from the table and using natural spices to using her food.  Abstaining from convenience and processed foods.  Went over fluid restriction of 64 ounces daily, and what constitutes a liquid.  She states she sucks on a lot of ice, explained that it melts at room temperature, it is considered a liquid, that one cup of ice is 1/2 cup of liquid.  She states that he has a scale at home.  Discussed the importance of weighing daily and reporting 2 pound weight gain overnight or 5 pounds within the week.  Made aware of follow-up in the outpatient heart failure clinic for which she has an appointment on January 5 at 11:30 in the morning.  She has a 0% of no-show.  She had no further questions.  She was given the living with heart failure teaching booklet, zone magnet, info on heart failure and low-sodium along with a weight chart.   Tresa Endo RN  [facility]

## 2022-10-31 NOTE — ED Notes (Signed)
Room temp inc at pt's request; pt denies any other needs currently; pt watching tv; CPAP remains in use.

## 2022-10-31 NOTE — Discharge Summary (Signed)
Physician Discharge Summary  Carla Huerta [id number] DOB: 1951-09-22 DOA: 10/30/2022  PCP: Carla Lank, MD  Admit date: 10/30/2022 Discharge date: 10/31/2022  Admitted From: Home Disposition:  Home  Discharge Condition:Stable CODE STATUS:FULL Diet recommendation: Heart Healthy  Brief/Interim Summary: Patient is a 71 year old female with history of liver cirrhosis, COPD, pancytopenia, depression, gout, hypertension sleep apnea on CPAP, who presented to the emergency department complaint of generalized weakness, progressive worsening of dyspnea, bilateral lower extremity edema, multiple falls. Report of drinking a lot of water, addition of extra salt to her meals. On presentation, she was hemodynamically stable. Lab work showed elevated BNP, pancytopenia. Chest x-ray showed cardiomegaly, features of pulm edema. Patient was started on IV Lasix.  Echo was done today showed preserved ejection fraction of 60 to 123456, grade 1 diastolic dysfunction.She is almost euvolemic today.  She is hemodynamically stable for discharge home today with oral Lasix and is bilateral.  Following problems were addressed during her hospitalization:  Volume overload/anasarca: Presented with dyspnea, weakness, bilateral lower extremity swelling.  Chest x-ray features of pulmonary edema, vascular congestion. This could be secondary to history of cirrhosis but echo has been done to rule out congestive heart failure.  Echo showed EF of 60 to 123456, grade 1 diastolic dysfunction.  She almost looks euvolemic today.  No significant lower extremity edema.  Lungs are clear to auscultation.  On room air. Started on IV Lasix.  Changed to oral Lasix and spironolactone on discharge   Liver cirrhosis secondary to NASH: On , lactulose at home.  Resume.  LFTs stable.  Will recommend to follow-up with hematology/gastroenterology  as an outpatient.   Pancytopenia:Most likely from liver cirrhosis.  Stable   History of hiatal  hernia: Continue PPI   Sleep apnea: Continue CPAP at bedtime      Discharge Diagnoses:  Principal Problem:   Acute CHF (congestive heart failure) (HCC) Active Problems:   Pancytopenia (HCC)   Hernia, hiatal   Sleep apnea   Liver cirrhosis secondary to NASH (nonalcoholic steatohepatitis) [facility])    Discharge Instructions  Discharge Instructions     Diet - low sodium heart healthy   Complete by: As directed    Discharge instructions   Complete by: As directed    1)Please take prescribed medication as instructed 2)Follow up with your PCP in a week.Do a BMP in a week during the follow-up to check your kidney function 3)Be careful on the fluid and salt intake at home. 4)Follow up with gastroenterology/hepatology for the management of your  cirrhosis of liver   Increase activity slowly   Complete by: As directed       Allergies as of 10/31/2022   No Known Allergies      Medication List     STOP taking these medications    levofloxacin 750 MG tablet Commonly known as: LEVAQUIN   rifaximin 550 MG Tabs tablet Commonly known as: XIFAXAN       TAKE these medications    albuterol 108 (90 Base) MCG/ACT inhaler Commonly known as: VENTOLIN HFA Inhale 2 puffs into the lungs every 4 (four) hours as needed for wheezing or shortness of breath.   alendronate 70 MG tablet Commonly known as: FOSAMAX Take 70 mg by mouth every Wednesday.   allopurinol 100 MG tablet Commonly known as: ZYLOPRIM Take 100 mg by mouth daily.   aspirin 81 MG chewable tablet Chew 81 mg by mouth at bedtime.   atorvastatin 40 MG tablet Commonly known as: LIPITOR Take 40 mg  by mouth daily.   cholecalciferol 1000 units tablet Commonly known as: VITAMIN D Take 1,000 Units by mouth daily.   Colcrys 0.6 MG tablet Generic drug: colchicine Take 0.6 mg by mouth at bedtime.   furosemide 40 MG tablet Commonly known as: Lasix Take 1 tablet (40 mg total) by mouth daily. What changed:   medication strength how much to take when to take this   gabapentin 300 MG capsule Commonly known as: NEURONTIN Take 3 capsules (900 mg total) by mouth at bedtime. What changed:  when to take this Another medication with the same name was removed. Continue taking this medication, and follow the directions you see here.   imipramine 50 MG tablet Commonly known as: TOFRANIL Take 100 mg by mouth at bedtime.   lactulose 10 GM/15ML solution Commonly known as: CHRONULAC Take 30 mLs (20 g total) by mouth 2 (two) times daily.   lisinopril 10 MG tablet Commonly known as: ZESTRIL Take 10 mg by mouth daily.   omeprazole 40 MG capsule Commonly known as: PRILOSEC Take 40 mg by mouth daily.   spironolactone 25 MG tablet Commonly known as: Aldactone Take 1 tablet (25 mg total) by mouth daily.   tolterodine 4 MG 24 hr capsule Commonly known as: DETROL LA Take 4 mg by mouth at bedtime.        Follow-up Information     Hillery Aldo, MD. Schedule an appointment as soon as possible for a visit in 1 week(s).   Specialty: Family Medicine Contact information: [address] [place] [address] [phone]                No Known Allergies  Consultations: none   Procedures/Studies: ECHOCARDIOGRAM COMPLETE  Result Date: 10/31/2022    ECHOCARDIOGRAM REPORT   Patient Name:   Carla Huerta Date of Exam: 10/31/2022 Medical Rec #:  [id number]       Height:       65.0 in Accession #:    [id number]      Weight:       310.8 lb Date of Birth:  11-09-1951      BSA:          2.386 m Patient Age:    71 years        BP:           120/62 mmHg Patient Gender: F               HR:           83 bpm. Exam Location:  [facility] Procedure: 2D Echo, Cardiac Doppler and Color Doppler Indications:     CHF I50.9  History:         Patient has no prior history of Echocardiogram examinations.                  COPD; Risk Factors:Hypertension and Sleep Apnea. Renal                  disorder.   Sonographer:     [name] Referring Phys:  [id number] Carla Huerta Diagnosing Phys: Carla Huerta  Sonographer Comments: Suboptimal apical window. IMPRESSIONS  1. Left ventricular ejection fraction, by estimation, is 60 to 65%. The left ventricle has normal function. The left ventricle has no regional wall motion abnormalities. Left ventricular diastolic parameters are consistent with Grade I diastolic dysfunction (impaired relaxation).  2. Right ventricular systolic function is normal. The right ventricular size is normal.  3. The  mitral valve is normal in structure. Trivial mitral valve regurgitation. No evidence of mitral stenosis.  4. The aortic valve is normal in structure. Aortic valve regurgitation is not visualized. Aortic valve sclerosis/calcification is present, without any evidence of aortic stenosis.  5. The inferior vena cava is normal in size with greater than 50% respiratory variability, suggesting right atrial pressure of 3 mmHg. FINDINGS  Left Ventricle: Left ventricular ejection fraction, by estimation, is 60 to 65%. The left ventricle has normal function. The left ventricle has no regional wall motion abnormalities. The left ventricular internal cavity size was normal in size. There is  no left ventricular hypertrophy. Left ventricular diastolic parameters are consistent with Grade I diastolic dysfunction (impaired relaxation). Right Ventricle: The right ventricular size is normal. No increase in right ventricular wall thickness. Right ventricular systolic function is normal. Left Atrium: Left atrial size was normal in size. Right Atrium: Right atrial size was normal in size. Pericardium: There is no evidence of pericardial effusion. Mitral Valve: The mitral valve is normal in structure. Trivial mitral valve regurgitation. No evidence of mitral valve stenosis. Tricuspid Valve: The tricuspid valve is normal in structure. Tricuspid valve regurgitation is trivial. No evidence of tricuspid  stenosis. Aortic Valve: The aortic valve is normal in structure. Aortic valve regurgitation is not visualized. Aortic valve sclerosis/calcification is present, without any evidence of aortic stenosis. Aortic valve mean gradient measures 5.0 mmHg. Aortic valve peak  gradient measures 9.5 mmHg. Aortic valve area, by VTI measures 3.22 cm. Pulmonic Valve: The pulmonic valve was normal in structure. Pulmonic valve regurgitation is not visualized. No evidence of pulmonic stenosis. Aorta: The aortic root is normal in size and structure. Venous: The inferior vena cava is normal in size with greater than 50% respiratory variability, suggesting right atrial pressure of 3 mmHg. IAS/Shunts: No atrial level shunt detected by color flow Doppler.  LEFT VENTRICLE PLAX 2D LVIDd:         5.00 cm   Diastology LVIDs:         3.10 cm   LV e' medial:    6.64 cm/s LV PW:         1.20 cm   LV E/e' medial:  16.9 LV IVS:        1.20 cm   LV e' lateral:   8.92 cm/s LVOT diam:     2.00 cm   LV E/e' lateral: 12.6 LV SV:         87 LV SV Index:   36 LVOT Area:     3.14 cm  RIGHT VENTRICLE RV Basal diam:  2.80 cm RV Mid diam:    2.00 cm RV S prime:     18.80 cm/s TAPSE (M-mode): 2.9 cm LEFT ATRIUM           Index        RIGHT ATRIUM           Index LA diam:      4.10 cm 1.72 cm/m   RA Area:     14.50 cm LA Vol (A2C): 72.1 ml 30.22 ml/m  RA Volume:   33.50 ml  14.04 ml/m LA Vol (A4C): 82.3 ml 34.49 ml/m  AORTIC VALVE AV Area (Vmax):    2.55 cm AV Area (Vmean):   2.60 cm AV Area (VTI):     3.22 cm AV Vmax:           154.00 cm/s AV Vmean:          101.000 cm/s  AV VTI:            0.269 m AV Peak Grad:      9.5 mmHg AV Mean Grad:      5.0 mmHg LVOT Vmax:         125.00 cm/s LVOT Vmean:        83.500 cm/s LVOT VTI:          0.276 m LVOT/AV VTI ratio: 1.03  AORTA Ao Root diam: 3.10 cm MITRAL VALVE                TRICUSPID VALVE MV Area (PHT): 5.42 cm     TR Peak grad:   27.2 mmHg MV Decel Time: 140 msec     TR Vmax:        261.00 cm/s MV E  velocity: 112.00 cm/s MV A velocity: 122.00 cm/s  SHUNTS MV E/A ratio:  0.92         Systemic VTI:  0.28 m                             Systemic Diam: 2.00 cm Neoma Laming Electronically signed by Neoma Laming Signature Date/Time: 10/31/2022/11:58:05 AM    Final    DG Tibia/Fibula Right  Result Date: 10/30/2022 CLINICAL DATA:  Trauma, fall EXAM: RIGHT TIBIA AND FIBULA - 2 VIEW COMPARISON:  None Available. FINDINGS: There is previous right knee arthroplasty. No recent fracture or dislocation is seen. There is soft tissue swelling around the ankle. A soft tissue fullness in suprapatellar bursa. Plantar spur is seen in calcaneus. Bony spurs are noted in the dorsal aspect of intertarsal joints. IMPRESSION: No recent fracture or dislocation is seen. The soft tissue fullness in suprapatellar bursa in right knee suggesting effusion. There is previous right knee arthroplasty. Plantar spur is seen in calcaneus. Bony spurs are noted in the intertarsal joints. Electronically Signed   By: Elmer Picker M.D.   On: 10/30/2022 21:13   DG Chest 2 View  Result Date: 10/30/2022 CLINICAL DATA:  sob EXAM: PORTABLE CHEST 1 VIEW COMPARISON:  05/03/2022 FINDINGS: Cardiac silhouette is prominent. There is pulmonary interstitial prominence with vascular congestion. No focal consolidation. No pneumothorax or pleural effusion identified. Aorta is calcified. There are thoracic degenerative changes. IMPRESSION: Findings suggest CHF. Electronically Signed   By: Sammie Bench M.D.   On: 10/30/2022 13:01      Subjective: Patient seen and examined at bedside today.  Hemodynamically stable for discharge.  No complaint of shortness of breath or cough. LE edema is  minimal.  She is on room air.  She is very eager to go home.  Discharge Exam: Vitals:   10/31/22 1230 10/31/22 1235  BP: 123/66   Pulse: 91   Resp: 17   Temp:  97.8 F (36.6 C)  SpO2: 98%    Vitals:   10/31/22 0934 10/31/22 1000 10/31/22 1230 10/31/22  1235  BP: (!) 144/78 105/60 123/66   Pulse: 100 100 91   Resp: 18 15 17    Temp: 98.2 F (36.8 C)   97.8 F (36.6 C)  TempSrc: Oral   Oral  SpO2: 99% 96% 98%   Weight: 135.2 kg       General: Pt is alert, awake, not in acute distress,obese Cardiovascular: RRR, S1/S2 +, no rubs, no gallops Respiratory: CTA bilaterally, no wheezing, no rhonchi Abdominal: Soft, NT, ND, bowel sounds + Extremities: trace lower extremity  edema, no cyanosis  The results of significant diagnostics from this hospitalization (including imaging, microbiology, ancillary and laboratory) are listed below for reference.     Microbiology: No results found for this or any previous visit (from the past 240 hour(s)).   Labs: BNP (last 3 results) Recent Labs    05/03/22 1248 10/30/22 1212  BNP 65.2 [id number]*   Basic Metabolic Panel: Recent Labs  Lab 10/30/22 1212 10/31/22 0432  NA 139 141  K 4.3 3.7  CL 105 106  CO2 24 26  GLUCOSE 107* 84  BUN 15 15  CREATININE 1.10* 1.08*  CALCIUM 9.2 9.0   Liver Function Tests: Recent Labs  Lab 10/30/22 1212 10/31/22 0432  AST 52* 47*  ALT 27 23  ALKPHOS 91 85  BILITOT 1.1 1.3*  PROT 7.0 6.5  ALBUMIN 3.3* 3.2*   Recent Labs  Lab 10/30/22 1212  LIPASE 41   Recent Labs  Lab 10/30/22 1210  AMMONIA 42*   CBC: Recent Labs  Lab 10/30/22 1212 10/30/22 2022 10/31/22 0432  WBC 3.6* 3.4* 3.1*  HGB 7.6* 7.7* 7.2*  HCT 27.8* 27.6* 25.9*  MCV 85.3 84.7 83.0  PLT 145* 170 170   Cardiac Enzymes: No results for input(s): "CKTOTAL", "CKMB", "CKMBINDEX", "TROPONINI" in the last 168 hours. BNP: Invalid input(s): "POCBNP" CBG: No results for input(s): "GLUCAP" in the last 168 hours. D-Dimer No results for input(s): "DDIMER" in the last 72 hours. Hgb A1c No results for input(s): "HGBA1C" in the last 72 hours. Lipid Profile No results for input(s): "CHOL", "HDL", "LDLCALC", "TRIG", "CHOLHDL", "LDLDIRECT" in the last 72 hours. Thyroid function  studies No results for input(s): "TSH", "T4TOTAL", "T3FREE", "THYROIDAB" in the last 72 hours.  Invalid input(s): "FREET3" Anemia work up No results for input(s): "VITAMINB12", "FOLATE", "FERRITIN", "TIBC", "IRON", "RETICCTPCT" in the last 72 hours. Urinalysis    Component Value Date/Time   COLORURINE YELLOW (A) 10/30/2022 1212   APPEARANCEUR HAZY (A) 10/30/2022 1212   APPEARANCEUR Clear 04/24/2012 2129   LABSPEC 1.005 10/30/2022 1212   LABSPEC 1.012 04/24/2012 2129   PHURINE 6.0 10/30/2022 1212   GLUCOSEU NEGATIVE 10/30/2022 1212   GLUCOSEU Negative 04/24/2012 2129   HGBUR NEGATIVE 10/30/2022 1212   BILIRUBINUR NEGATIVE 10/30/2022 1212   BILIRUBINUR Negative 04/24/2012 2129   KETONESUR NEGATIVE 10/30/2022 1212   PROTEINUR NEGATIVE 10/30/2022 1212   NITRITE NEGATIVE 10/30/2022 1212   LEUKOCYTESUR SMALL (A) 10/30/2022 1212   LEUKOCYTESUR Negative 04/24/2012 2129   Sepsis Labs Recent Labs  Lab 10/30/22 1212 10/30/22 2022 10/31/22 0432  WBC 3.6* 3.4* 3.1*   Microbiology No results found for this or any previous visit (from the past 240 hour(s)).  Please note: You were cared for by a hospitalist during your hospital stay. Once you are discharged, your primary care physician will handle any further medical issues. Please note that NO REFILLS for any discharge medications will be authorized once you are discharged, as it is imperative that you return to your primary care physician (or establish a relationship with a primary care physician if you do not have one) for your post hospital discharge needs so that they can reassess your need for medications and monitor your lab values.    Time coordinating discharge: 40 minutes  SIGNED:   Shelly Coss, MD  [facility] 10/31/2022, 12:44 PM Pager [phone]  If 7PM-7AM, please contact night-coverage [email or web address] Password [email or web address]

## 2022-10-31 NOTE — ED Notes (Signed)
Report to travis, rn.  

## 2022-10-31 NOTE — Discharge Instructions (Signed)

## 2022-11-15 ENCOUNTER — Encounter: Payer: Self-pay | Admitting: Family

## 2022-11-15 ENCOUNTER — Ambulatory Visit: Payer: Medicare HMO | Attending: Family | Admitting: Family

## 2022-11-15 VITALS — BP 115/65 | HR 102 | Resp 20 | Wt 306.0 lb

## 2022-11-15 DIAGNOSIS — G4700 Insomnia, unspecified: Secondary | ICD-10-CM | POA: Insufficient documentation

## 2022-11-15 DIAGNOSIS — K7581 Nonalcoholic steatohepatitis (NASH): Secondary | ICD-10-CM | POA: Insufficient documentation

## 2022-11-15 DIAGNOSIS — I10 Essential (primary) hypertension: Secondary | ICD-10-CM

## 2022-11-15 DIAGNOSIS — Z79899 Other long term (current) drug therapy: Secondary | ICD-10-CM | POA: Diagnosis not present

## 2022-11-15 DIAGNOSIS — N189 Chronic kidney disease, unspecified: Secondary | ICD-10-CM | POA: Diagnosis not present

## 2022-11-15 DIAGNOSIS — Z87891 Personal history of nicotine dependence: Secondary | ICD-10-CM | POA: Diagnosis not present

## 2022-11-15 DIAGNOSIS — J449 Chronic obstructive pulmonary disease, unspecified: Secondary | ICD-10-CM | POA: Insufficient documentation

## 2022-11-15 DIAGNOSIS — I130 Hypertensive heart and chronic kidney disease with heart failure and stage 1 through stage 4 chronic kidney disease, or unspecified chronic kidney disease: Secondary | ICD-10-CM | POA: Diagnosis present

## 2022-11-15 DIAGNOSIS — G4733 Obstructive sleep apnea (adult) (pediatric): Secondary | ICD-10-CM | POA: Insufficient documentation

## 2022-11-15 DIAGNOSIS — I5032 Chronic diastolic (congestive) heart failure: Secondary | ICD-10-CM | POA: Diagnosis not present

## 2022-11-15 DIAGNOSIS — Z87440 Personal history of urinary (tract) infections: Secondary | ICD-10-CM | POA: Insufficient documentation

## 2022-11-15 DIAGNOSIS — K7460 Unspecified cirrhosis of liver: Secondary | ICD-10-CM | POA: Diagnosis not present

## 2022-11-15 MED ORDER — FUROSEMIDE 40 MG PO TABS: 80.0000 mg | ORAL_TABLET | Freq: Every day | ORAL | 5 refills | Status: DC

## 2022-11-15 NOTE — Patient Instructions (Addendum)
Begin weighing daily and call for an overnight weight gain of 3 pounds or more or a weekly weight gain of more than 5 pounds.   If you have voicemail, please make sure your mailbox is cleaned out so that we may leave a message and please make sure to listen to any voicemails.    Increase your furosemide to 2 tablets every day.

## 2022-11-15 NOTE — Progress Notes (Unsigned)
Patient ID: Carla Huerta, female    DOB: Mar 08, 1951, 72 y.o.   MRN: [id number]  HPI  Carla Huerta is a 72 y/o female with a history of HTN, CKD, anemia, COPD, depression, diaphragm paralysis, gout, hyponatremia, cirrhosis d/t NASH, sleep apnea, previous tobacco use and chronic heart failure.   Echo report from 10/31/22 showed an EF of 60-65% with trivial MR.   Was in the ED 10/30/22 due to acute on chronic heart failure.   She presents today for her initial visit with a chief complaint of moderate fatigue with minimal exertion. Describes this as chronic in nature. Has associated shortness of breath, pedal edema, palpitations, abdominal distention, dizziness and difficulty sleeping along with this. Denies any chest pain or cough.   Past Medical History:  Diagnosis Date   Anemia    CHF (congestive heart failure) (HCC)    COPD (chronic obstructive pulmonary disease) (HCC)    Cough    Depression    Diaphragm paralysis    Gout    Hypertension    Hyponatremia    Insomnia    Liver disease    Nonalcoholic hepatosteatosis    PONV (postoperative nausea and vomiting)    Renal disorder    Sleep apnea    Splenic laceration    Vertigo    Past Surgical History:  Procedure Laterality Date   APPENDECTOMY     when she was 43 yearsa old   BREAST BIOPSY Left 06/17/2019   affirm bx distortion with calcs, coil clip, ADH   BREAST EXCISIONAL BIOPSY Left 09/23/2019   neg   BREAST LUMPECTOMY Left 09/23/2019   ADH   BREAST LUMPECTOMY WITH NEEDLE LOCALIZATION Left 09/23/2019   Procedure: LEFT BREAST LUMPECTOMY WITH NEEDLE LOCALIZATION;  Surgeon: Jovita Kussmaul, MD;  Location: [facility] ORS;  Service: General;  Laterality: Left;   COLONOSCOPY WITH PROPOFOL N/A 10/01/2016   Procedure: COLONOSCOPY WITH PROPOFOL;  Surgeon: Lucilla Lame, MD;  Location: [facility] ENDOSCOPY;  Service: Endoscopy;  Laterality: N/A;   ESOPHAGOGASTRODUODENOSCOPY (EGD) WITH PROPOFOL N/A 10/01/2016   Procedure:  ESOPHAGOGASTRODUODENOSCOPY (EGD) WITH PROPOFOL;  Surgeon: Lucilla Lame, MD;  Location: [facility] ENDOSCOPY;  Service: Endoscopy;  Laterality: N/A;   LIVER BIOPSY     2015   REPLACEMENT TOTAL KNEE Right 2002   TONSILLECTOMY AND ADENOIDECTOMY     when pt was 72 years old   Family History  Problem Relation Age of Onset   Breast cancer Sister    Breast cancer Paternal Grandmother    Breast cancer Other    Social History   Tobacco Use   Smoking status: Former    Years: 1.00    Types: Cigarettes   Smokeless tobacco: Current  Substance Use Topics   Alcohol use: No   No Known Allergies Prior to Admission medications   Medication Sig Start Date End Date Taking? Authorizing Provider  albuterol (PROVENTIL HFA;VENTOLIN HFA) 108 (90 Base) MCG/ACT inhaler Inhale 2 puffs into the lungs every 4 (four) hours as needed for wheezing or shortness of breath.   Yes [provider]  alendronate (FOSAMAX) 70 MG tablet Take 70 mg by mouth every Wednesday.  08/12/19  Yes [provider]  allopurinol (ZYLOPRIM) 100 MG tablet Take 100 mg by mouth daily. 04/11/22  Yes [provider]  aspirin 81 MG chewable tablet Chew 81 mg by mouth at bedtime.   Yes [provider]  atorvastatin (LIPITOR) 40 MG tablet Take 40 mg by mouth daily.    Yes [provider]  cholecalciferol (VITAMIN D) 1000 units tablet Take 1,000 Units by mouth daily.   Yes [provider]  ferrous sulfate 325 (65 FE) MG EC tablet Take 325 mg by mouth daily with breakfast.   Yes [provider]  furosemide (LASIX) 40 MG tablet Take 1 tablet (40 mg total) by mouth daily. 10/31/22 10/31/23 Yes Shelly Coss, MD  gabapentin (NEURONTIN) 300 MG capsule Take 3 capsules (900 mg total) by mouth at bedtime. Patient taking differently: Take 900 mg by mouth 2 (two) times daily. 05/07/22  Yes Jennye Boroughs, MD  imipramine (TOFRANIL) 50 MG tablet Take 100 mg by mouth at bedtime.  08/09/16  Yes [provider]  lisinopril (PRINIVIL,ZESTRIL) 10 MG tablet Take 10 mg by mouth daily.   Yes [provider]  omeprazole (PRILOSEC) 40 MG capsule Take 40 mg by mouth daily.    Yes [provider]  spironolactone (ALDACTONE) 25 MG tablet Take 1 tablet (25 mg total) by mouth daily. 10/31/22 11/30/22 Yes Shelly Coss, MD  tolterodine (DETROL LA) 4 MG 24 hr capsule Take 4 mg by mouth at bedtime.   Yes [provider]   Review of Systems  Constitutional:  Positive for fatigue (easily). Negative for appetite change.  HENT:  Negative for congestion, postnasal drip and sore throat.   Eyes: Negative.   Respiratory:  Positive for shortness of breath (easily). Negative for cough and chest tightness.   Cardiovascular:  Positive for palpitations (at times) and leg swelling (improving). Negative for chest pain.  Gastrointestinal:  Positive for abdominal distention. Negative for abdominal pain.  Endocrine: Negative.   Genitourinary: Negative.   Musculoskeletal:  Negative for back pain and neck pain.  Skin: Negative.   Allergic/Immunologic: Negative.   Neurological:  Positive for dizziness (at times). Negative for light-headedness.  Hematological:  Negative for adenopathy. Does not bruise/bleed easily.  Psychiatric/Behavioral:  Positive for sleep disturbance (sleeping on 2 pillows with CPAP). Negative for dysphoric mood. The patient is not nervous/anxious.    Vitals:   11/15/22 1101  BP: 115/65  Pulse: (!) 102  Resp: 20  SpO2: 98%  Weight: (!) 306 lb (138.8 kg)   Wt Readings from Last 3 Encounters:  11/15/22 (!) 306 lb (138.8 kg)  10/31/22 298 lb (135.2 kg)  05/03/22 (!) 310 lb 13.6 oz (141 kg)   Lab Results  Component Value Date   CREATININE 1.08 (H) 10/31/2022   CREATININE 1.10 (H) 10/30/2022   CREATININE 1.05 (H) 05/07/2022   Physical Exam Vitals and nursing note reviewed. Exam conducted with a chaperone present (daughter).  Constitutional:      Appearance:  Normal appearance.  HENT:     Head: Normocephalic and atraumatic.  Cardiovascular:     Rate and Rhythm: Regular rhythm. Tachycardia present.  Pulmonary:     Effort: Pulmonary effort is normal. No respiratory distress.     Breath sounds: No wheezing or rales.  Abdominal:     General: There is distension.     Palpations: Abdomen is soft.  Musculoskeletal:        General: No tenderness.     Cervical back: Normal range of motion and neck supple.     Right lower leg: Edema (1+ pitting) present.     Left lower leg: Edema (1+ pitting) present.  Skin:    General: Skin is warm and dry.  Neurological:     General: No focal deficit present.     Mental Status: She is alert and oriented to  person, place, and time.  Psychiatric:        Mood and Affect: Mood normal.        Behavior: Behavior normal.        Thought Content: Thought content normal.   Assessment & Plan:  1: Chronic heart failure with preserved ejection fraction without LVH/LAE- - NYHA class III - minimally fluid overloaded with edema - not weighing but does have scales; instructed to weigh daily and call for an overnight weight gain of > 2 pounds or a weekly weight gain of > 5 pounds - will increase her furosemide to 80mg  daily; can take both tablets at the same time or split them into divided doses - check BMP next visit - if symptoms persists, discussed changing her diuretic to torsemide - not adding salt to her food - has history of recurrent UTI's so would not be candidate for SGLT2 - BNP  10/30/22 was 155.4  2: HTN- - BP 115/65 - sees PCP Posey Pronto) at [facility] on 12/05/22 - BMP 10/31/22 showed sodium 141, potassium 3.7, creatinine 1.08 & GFR 55  3: COPD- - uses albuterol inhaler that she uses PRN  4: Cirrhosis secondary to NASH- - bilirubin 10/31/22 was 1.3 - on rifampin  5: OSA- - wears CPAP nightly   Medication bottles reviewed.   Return in 3 weeks, sooner if needed.

## 2022-11-16 ENCOUNTER — Encounter: Payer: Self-pay | Admitting: Family

## 2022-11-22 ENCOUNTER — Inpatient Hospital Stay
Admission: EM | Admit: 2022-11-22 | Discharge: 2022-11-26 | DRG: 683 | Disposition: A | Discharge status: Home Health | Payer: Medicare HMO | Attending: Student | Admitting: Student

## 2022-11-22 ENCOUNTER — Emergency Department: Payer: Medicare HMO

## 2022-11-22 ENCOUNTER — Other Ambulatory Visit: Payer: Self-pay

## 2022-11-22 DIAGNOSIS — E860 Dehydration: Secondary | ICD-10-CM | POA: Diagnosis present

## 2022-11-22 DIAGNOSIS — D509 Iron deficiency anemia, unspecified: Secondary | ICD-10-CM | POA: Diagnosis present

## 2022-11-22 DIAGNOSIS — K7469 Other cirrhosis of liver: Secondary | ICD-10-CM | POA: Diagnosis present

## 2022-11-22 DIAGNOSIS — R32 Unspecified urinary incontinence: Secondary | ICD-10-CM | POA: Diagnosis present

## 2022-11-22 DIAGNOSIS — R531 Weakness: Secondary | ICD-10-CM

## 2022-11-22 DIAGNOSIS — J810 Acute pulmonary edema: Principal | ICD-10-CM

## 2022-11-22 DIAGNOSIS — J9611 Chronic respiratory failure with hypoxia: Secondary | ICD-10-CM | POA: Diagnosis not present

## 2022-11-22 DIAGNOSIS — W010XXA Fall on same level from slipping, tripping and stumbling without subsequent striking against object, initial encounter: Secondary | ICD-10-CM | POA: Diagnosis present

## 2022-11-22 DIAGNOSIS — N6092 Unspecified benign mammary dysplasia of left breast: Secondary | ICD-10-CM | POA: Diagnosis present

## 2022-11-22 DIAGNOSIS — N179 Acute kidney failure, unspecified: Principal | ICD-10-CM

## 2022-11-22 DIAGNOSIS — D638 Anemia in other chronic diseases classified elsewhere: Secondary | ICD-10-CM | POA: Insufficient documentation

## 2022-11-22 DIAGNOSIS — Z720 Tobacco use: Secondary | ICD-10-CM

## 2022-11-22 DIAGNOSIS — I509 Heart failure, unspecified: Secondary | ICD-10-CM | POA: Diagnosis present

## 2022-11-22 DIAGNOSIS — N1831 Chronic kidney disease, stage 3a: Secondary | ICD-10-CM | POA: Diagnosis present

## 2022-11-22 DIAGNOSIS — I130 Hypertensive heart and chronic kidney disease with heart failure and stage 1 through stage 4 chronic kidney disease, or unspecified chronic kidney disease: Secondary | ICD-10-CM | POA: Diagnosis present

## 2022-11-22 DIAGNOSIS — L304 Erythema intertrigo: Secondary | ICD-10-CM | POA: Diagnosis present

## 2022-11-22 DIAGNOSIS — D61818 Other pancytopenia: Secondary | ICD-10-CM | POA: Diagnosis present

## 2022-11-22 DIAGNOSIS — G4733 Obstructive sleep apnea (adult) (pediatric): Secondary | ICD-10-CM | POA: Diagnosis present

## 2022-11-22 DIAGNOSIS — D631 Anemia in chronic kidney disease: Secondary | ICD-10-CM | POA: Diagnosis present

## 2022-11-22 DIAGNOSIS — K7581 Nonalcoholic steatohepatitis (NASH): Secondary | ICD-10-CM | POA: Diagnosis present

## 2022-11-22 DIAGNOSIS — W19XXXA Unspecified fall, initial encounter: Secondary | ICD-10-CM

## 2022-11-22 DIAGNOSIS — J449 Chronic obstructive pulmonary disease, unspecified: Secondary | ICD-10-CM | POA: Diagnosis present

## 2022-11-22 DIAGNOSIS — E785 Hyperlipidemia, unspecified: Secondary | ICD-10-CM | POA: Insufficient documentation

## 2022-11-22 DIAGNOSIS — Z6841 Body Mass Index (BMI) 40.0 and over, adult: Secondary | ICD-10-CM

## 2022-11-22 DIAGNOSIS — Z1152 Encounter for screening for COVID-19: Secondary | ICD-10-CM

## 2022-11-22 DIAGNOSIS — F32A Depression, unspecified: Secondary | ICD-10-CM | POA: Insufficient documentation

## 2022-11-22 DIAGNOSIS — K7460 Unspecified cirrhosis of liver: Secondary | ICD-10-CM | POA: Diagnosis present

## 2022-11-22 DIAGNOSIS — Z7982 Long term (current) use of aspirin: Secondary | ICD-10-CM

## 2022-11-22 DIAGNOSIS — G4730 Sleep apnea, unspecified: Secondary | ICD-10-CM | POA: Diagnosis present

## 2022-11-22 DIAGNOSIS — R0602 Shortness of breath: Secondary | ICD-10-CM | POA: Diagnosis present

## 2022-11-22 DIAGNOSIS — M109 Gout, unspecified: Secondary | ICD-10-CM | POA: Insufficient documentation

## 2022-11-22 DIAGNOSIS — Y92009 Unspecified place in unspecified non-institutional (private) residence as the place of occurrence of the external cause: Secondary | ICD-10-CM

## 2022-11-22 DIAGNOSIS — Z79899 Other long term (current) drug therapy: Secondary | ICD-10-CM

## 2022-11-22 LAB — CBC
HCT: 35.7 % — ABNORMAL LOW (ref 36.0–46.0)
Hemoglobin: 10.5 g/dL — ABNORMAL LOW (ref 12.0–15.0)
MCH: 25.9 pg — ABNORMAL LOW (ref 26.0–34.0)
MCHC: 29.4 g/dL — ABNORMAL LOW (ref 30.0–36.0)
MCV: 88.1 fL (ref 80.0–100.0)
Platelets: 129 10*3/uL — ABNORMAL LOW (ref 150–400)
RBC: 4.05 MIL/uL (ref 3.87–5.11)
RDW: 24.6 % — ABNORMAL HIGH (ref 11.5–15.5)
WBC: 6.1 10*3/uL (ref 4.0–10.5)
nRBC: 0 % (ref 0.0–0.2)

## 2022-11-22 LAB — URINALYSIS, ROUTINE W REFLEX MICROSCOPIC
Bilirubin Urine: NEGATIVE
Glucose, UA: NEGATIVE mg/dL
Hgb urine dipstick: NEGATIVE
Ketones, ur: NEGATIVE mg/dL
Nitrite: NEGATIVE
Protein, ur: NEGATIVE mg/dL
Specific Gravity, Urine: 1.005 (ref 1.005–1.030)
pH: 7 (ref 5.0–8.0)

## 2022-11-22 LAB — HEPATIC FUNCTION PANEL
ALT: 31 U/L (ref 0–44)
AST: 78 U/L — ABNORMAL HIGH (ref 15–41)
Albumin: 3.4 g/dL — ABNORMAL LOW (ref 3.5–5.0)
Alkaline Phosphatase: 87 U/L (ref 38–126)
Bilirubin, Direct: 0.3 mg/dL — ABNORMAL HIGH (ref 0.0–0.2)
Indirect Bilirubin: 0.7 mg/dL (ref 0.3–0.9)
Total Bilirubin: 1 mg/dL (ref 0.3–1.2)
Total Protein: 7.1 g/dL (ref 6.5–8.1)

## 2022-11-22 LAB — BASIC METABOLIC PANEL
Anion gap: 13 (ref 5–15)
BUN: 28 mg/dL — ABNORMAL HIGH (ref 8–23)
CO2: 19 mmol/L — ABNORMAL LOW (ref 22–32)
Calcium: 10.1 mg/dL (ref 8.9–10.3)
Chloride: 103 mmol/L (ref 98–111)
Creatinine, Ser: 1.61 mg/dL — ABNORMAL HIGH (ref 0.44–1.00)
GFR, Estimated: 34 mL/min — ABNORMAL LOW (ref >60)
Glucose, Bld: 84 mg/dL (ref 70–99)
Potassium: 4.4 mmol/L (ref 3.5–5.1)
Sodium: 135 mmol/L (ref 135–145)

## 2022-11-22 LAB — TROPONIN I (HIGH SENSITIVITY)
Troponin I (High Sensitivity): 6 ng/L (ref <18)
Troponin I (High Sensitivity): 7 ng/L (ref <18)
Troponin I (High Sensitivity): 6 ng/L (ref <18)

## 2022-11-22 LAB — RESP PANEL BY RT-PCR (RSV, FLU A&B, COVID)  RVPGX2
Influenza A by PCR: NEGATIVE
Influenza B by PCR: NEGATIVE
Resp Syncytial Virus by PCR: NEGATIVE
SARS Coronavirus 2 by RT PCR: NEGATIVE

## 2022-11-22 LAB — PROCALCITONIN: Procalcitonin: <0.1 ng/mL

## 2022-11-22 MED ORDER — GABAPENTIN 300 MG PO CAPS
300.0000 mg | ORAL_CAPSULE | Freq: Every day | ORAL | Status: DC
  Administered 2022-11-23 – 2022-11-26 (×4): 300 mg via ORAL
  Filled 2022-11-22 (×4): qty 1

## 2022-11-22 MED ORDER — SENNOSIDES-DOCUSATE SODIUM 8.6-50 MG PO TABS
1.0000 | ORAL_TABLET | Freq: Every evening | ORAL | Status: DC | PRN
  Administered 2022-11-23: 1 via ORAL
  Filled 2022-11-22: qty 1

## 2022-11-22 MED ORDER — FUROSEMIDE 10 MG/ML IJ SOLN: 40.0000 mg | Freq: Every day | INTRAMUSCULAR | Status: DC

## 2022-11-22 MED ORDER — ENOXAPARIN SODIUM 40 MG/0.4ML IJ SOSY
40.0000 mg | PREFILLED_SYRINGE | INTRAMUSCULAR | Status: DC
  Administered 2022-11-22 – 2022-11-25 (×4): 40 mg via SUBCUTANEOUS
  Filled 2022-11-22 (×4): qty 0.4

## 2022-11-22 MED ORDER — IMIPRAMINE HCL 50 MG PO TABS
100.0000 mg | ORAL_TABLET | Freq: Every day | ORAL | Status: DC
  Administered 2022-11-22 – 2022-11-25 (×4): 100 mg via ORAL
  Filled 2022-11-22 (×4): qty 2

## 2022-11-22 MED ORDER — ONDANSETRON HCL 4 MG/2ML IJ SOLN: 4.0000 mg | Freq: Four times a day (QID) | INTRAMUSCULAR | Status: DC | PRN

## 2022-11-22 MED ORDER — SODIUM CHLORIDE 0.9 % IV SOLN: Freq: Once | INTRAVENOUS | Status: DC

## 2022-11-22 MED ORDER — ASPIRIN 81 MG PO CHEW
81.0000 mg | CHEWABLE_TABLET | Freq: Every day | ORAL | Status: DC
  Administered 2022-11-23 – 2022-11-25 (×3): 81 mg via ORAL
  Filled 2022-11-22 (×3): qty 1

## 2022-11-22 MED ORDER — ALLOPURINOL 100 MG PO TABS
100.0000 mg | ORAL_TABLET | Freq: Every day | ORAL | Status: DC
  Administered 2022-11-23 – 2022-11-26 (×4): 100 mg via ORAL
  Filled 2022-11-22 (×4): qty 1

## 2022-11-22 MED ORDER — FESOTERODINE FUMARATE ER 4 MG PO TB24
4.0000 mg | ORAL_TABLET | Freq: Every day | ORAL | Status: DC
  Administered 2022-11-22 – 2022-11-25 (×4): 4 mg via ORAL
  Filled 2022-11-22 (×4): qty 1

## 2022-11-22 MED ORDER — FUROSEMIDE 10 MG/ML IJ SOLN: 40.0000 mg | Freq: Two times a day (BID) | INTRAMUSCULAR | Status: DC

## 2022-11-22 MED ORDER — ACETAMINOPHEN 325 MG PO TABS: 650.0000 mg | ORAL_TABLET | Freq: Four times a day (QID) | ORAL | Status: DC | PRN

## 2022-11-22 MED ORDER — FERROUS SULFATE 325 (65 FE) MG PO TABS
325.0000 mg | ORAL_TABLET | Freq: Every day | ORAL | Status: DC
  Administered 2022-11-23 – 2022-11-26 (×4): 325 mg via ORAL
  Filled 2022-11-22 (×4): qty 1

## 2022-11-22 MED ORDER — ALBUTEROL SULFATE (2.5 MG/3ML) 0.083% IN NEBU: 3.0000 mL | INHALATION_SOLUTION | RESPIRATORY_TRACT | Status: DC | PRN

## 2022-11-22 MED ORDER — ONDANSETRON HCL 4 MG PO TABS: 4.0000 mg | ORAL_TABLET | Freq: Four times a day (QID) | ORAL | Status: DC | PRN

## 2022-11-22 MED ORDER — PANTOPRAZOLE SODIUM 40 MG PO TBEC
80.0000 mg | DELAYED_RELEASE_TABLET | Freq: Every day | ORAL | Status: DC
  Administered 2022-11-23 – 2022-11-26 (×4): 80 mg via ORAL
  Filled 2022-11-22 (×4): qty 2

## 2022-11-22 MED ORDER — ACETAMINOPHEN 650 MG RE SUPP: 650.0000 mg | Freq: Four times a day (QID) | RECTAL | Status: DC | PRN

## 2022-11-22 MED ORDER — FUROSEMIDE 10 MG/ML IJ SOLN
60.0000 mg | Freq: Once | INTRAMUSCULAR | Status: AC
  Administered 2022-11-22: 60 mg via INTRAVENOUS
  Filled 2022-11-22: qty 8

## 2022-11-22 MED ORDER — ATORVASTATIN CALCIUM 20 MG PO TABS
40.0000 mg | ORAL_TABLET | Freq: Every day | ORAL | Status: DC
  Administered 2022-11-23 – 2022-11-25 (×3): 40 mg via ORAL
  Filled 2022-11-22 (×3): qty 2

## 2022-11-22 NOTE — ED Notes (Signed)
Daughter, Kenney Houseman, verified phone number was in chart and requested a call if her mother moves upstairs to a new room

## 2022-11-22 NOTE — ED Notes (Signed)
PT able to ambulate in room with walker - steady gait noted. Took patient to bathroom and returned to stretcher. Pt with increase SOB when off oxygen and walking. Notified dr. Corky Downs, plan for admission.

## 2022-11-22 NOTE — Hospital Course (Signed)
Ms. Carla Huerta is a morbid obesity, COPD, depression, gout, hypertension, sleep apnea who wears CPAP, liver cirrhosis, who presents emergency department for chief concerns of weakness.  Patient also had a fall in the morning prior to ED presentation.  Initial vitals in the ED showed temperature is 98, respiration rate of 19, heart rate of 96, blood pressure 129/65, SpO2 100% on room air.  Serum sodium is 135, potassium 4.4, chloride 103, bicarb 19, BUN of 26 8, serum creatinine 1.61, EGFR 34, nonfasting glucose 84, WBC 6.1, hemoglobin 10.5, platelets of 129.  Portable chest x-ray: Mild interstitial pulmonary edema.  CT head without contrast and CT cervical spine: were read as no acute intracranial abnormality.  No acute fracture or traumatic lithiasis of the cervical spine.  ED treatment: Furosemide 60 mg IV one-time dose.

## 2022-11-22 NOTE — Assessment & Plan Note (Signed)
-  Atorvastatin 40 mg nightly resumed

## 2022-11-22 NOTE — Assessment & Plan Note (Addendum)
-  This complicates overall care and prognosis.  

## 2022-11-22 NOTE — Assessment & Plan Note (Addendum)
-  Suspect prerenal secondary to cardiorenal, treat per heart failure - BMP in the AM

## 2022-11-22 NOTE — Assessment & Plan Note (Signed)
-  Imipramine 100 mg nightly resumed

## 2022-11-22 NOTE — Assessment & Plan Note (Signed)
-  CPAP nightly ordered 

## 2022-11-22 NOTE — ED Provider Notes (Signed)
[facility] Provider Note    Event Date/Time   First MD Initiated Contact with Patient 11/22/22 1236     (approximate)   History   Weakness   HPI  Carla Huerta is a 72 y.o. female with a history of CHF, COPD, liver disease who presents after a fall.  Patient thinks she stood up too quickly, lost her balance and fell, she was unable to get herself up.  She did hit her head.  She is not on blood thinners.  She reports she typically walks with a walker     Physical Exam   Triage Vital Signs: ED Triage Vitals  Enc Vitals Group     BP 11/22/22 1053 129/65     Pulse Rate 11/22/22 1053 96     Resp 11/22/22 1053 19     Temp 11/22/22 1053 98 F (36.7 C)     Temp Source 11/22/22 1053 Oral     SpO2 11/22/22 1053 100 %     Weight --      Height --      Head Circumference --      Peak Flow --      Pain Score 11/22/22 1052 0     Pain Loc --      Pain Edu? --      Excl. in [place]? --     Most recent vital signs: Vitals:   11/22/22 1330 11/22/22 1400  BP: 117/72 112/70  Pulse: 90 93  Resp: 15 14  Temp:    SpO2: 100% 100%     General: Awake, no distress.  CV:  Good peripheral perfusion.  No small tenderness palpation Resp:  Normal effort.  Clear to auscultation bilaterally Abd:  No distention.  No tenderness to palpation Other:  Normal range of motion of the lower extremities and upper extremities, no bony abnormalities   ED Results / Procedures / Treatments   Labs (all labs ordered are listed, but only abnormal results are displayed) Labs Reviewed  BASIC METABOLIC PANEL - Abnormal; Notable for the following components:      Result Value   CO2 19 (*)    BUN 28 (*)    Creatinine, Ser 1.61 (*)    GFR, Estimated 34 (*)    All other components within normal limits  CBC - Abnormal; Notable for the following components:   Hemoglobin 10.5 (*)    HCT 35.7 (*)    MCH 25.9 (*)    MCHC 29.4 (*)    RDW 24.6 (*)    Platelets 129 (*)    All  other components within normal limits  URINALYSIS, ROUTINE W REFLEX MICROSCOPIC  CBG MONITORING, ED     EKG     RADIOLOGY CT head viewed interpret by me, no ICH    PROCEDURES:  Critical Care performed:   Procedures   MEDICATIONS ORDERED IN ED: Medications  furosemide (LASIX) injection 60 mg (has no administration in time range)     IMPRESSION / MDM / ASSESSMENT AND PLAN / ED COURSE  I reviewed the triage vital signs and the nursing notes. Patient's presentation is most consistent with acute presentation with potential threat to life or bodily function.   Patient presents after a fall, she reports she does not think that she was injured.  She did hit her head.  CT head and cervical spine are reassuring.  She has a history of CHF, chest x-ray demonstrates mild interstitial edema which is likely chronic  for her.  Her oxygen saturations are normal and her respiratory rate is normal.  Attempt to ambulate the patient with a walker she would like to go home if possible  Patient was able to ambulate with walker but became markedly short of breath, she will require admission for diuresis further evaluation, have discussed with the hospitalist     FINAL CLINICAL IMPRESSION(S) / ED DIAGNOSES   Final diagnoses:  Acute pulmonary edema ([place])  Weakness     Rx / DC Orders   ED Discharge Orders     None        Note:  This document was prepared using Dragon voice recognition software and may include unintentional dictation errors.   Lavonia Drafts, MD 11/22/22 [phone]

## 2022-11-22 NOTE — Assessment & Plan Note (Addendum)
-  Presumed secondary to heart failure exacerbation - Treat per heart failure - Check procalcitonin, respiratory panel for COVID/influenza A/influenza B/RSV PCR, high sensitive troponin

## 2022-11-22 NOTE — H&P (Addendum)
History and Physical   Carla Huerta FBP:[id number] DOB: May 13, 1951 DOA: 11/22/2022  PCP: Denton Lank, MD  Outpatient Specialists: Dr. Grayland Ormond, oncology Patient coming from: home   I have personally briefly reviewed patient's old medical records in [place].  Chief Concern: weakness, fall  HPI: Carla Huerta is a morbid obesity, COPD, depression, gout, hypertension, sleep apnea who wears CPAP, liver cirrhosis, who presents emergency department for chief concerns of weakness.  Patient also had a fall in the morning prior to ED presentation.  Initial vitals in the ED showed temperature is 98, respiration rate of 19, heart rate of 96, blood pressure 129/65, SpO2 100% on room air.  Serum sodium is 135, potassium 4.4, chloride 103, bicarb 19, BUN of 26 8, serum creatinine 1.61, EGFR 34, nonfasting glucose 84, WBC 6.1, hemoglobin 10.5, platelets of 129.  Portable chest x-ray: Mild interstitial pulmonary edema.  CT head without contrast and CT cervical spine: were read as no acute intracranial abnormality.  No acute fracture or traumatic lithiasis of the cervical spine.  ED treatment: Furosemide 60 mg IV one-time dose. ---------------------------- At bedside, patient she is able to tell me her name, age, current calendar, current location.  She reports persistent dyspnea and weakness that has been going on for about one week.  She denies known sick contacts. She denies fever, chest pain, dysuria, hematuria, diarrhea, syncope. She denies head trauma, lost of consciousness.  She denies nausea and vomiting. She endorses swelling of her lower extremities, that started Sunday.  Water: four, 16 oz bottles Coffee: none Tea: none Juice: none Milk: 1/2 cup Soup: occasionally one can of chicken noodle soup  Social history: She lives at home with her husband, daughter, grandchildren. She is a former tobacco user, quitting 30+ years ago. She smoked 2 ppds. She denies etoh and  recreational drug use. She is retired and formerly worked in a [employer].  ROS: Constitutional: no weight change, no fever ENT/Mouth: no sore throat, no rhinorrhea Eyes: no eye pain, no vision changes Cardiovascular: no chest pain, + dyspnea,  no edema, no palpitations Respiratory: + cough, no sputum, no wheezing Gastrointestinal: no nausea, no vomiting, no diarrhea, no constipation Genitourinary: no urinary incontinence, no dysuria, no hematuria Musculoskeletal: no arthralgias, no myalgias Skin: no skin lesions, no pruritus, Neuro: + weakness, no loss of consciousness, no syncope Psych: no anxiety, no depression, + decrease appetite Heme/Lymph: no bruising, no bleeding  ED Course: Discussed with emergency medicine provider, patient requiring hospitalization for chief concerns of heart failure exacerbation.  Assessment/Plan  Principal Problem:   Chronic respiratory failure with hypoxia (HCC) Active Problems:   Iron deficiency anemia   Atypical ductal hyperplasia of left breast   Sleep apnea   Liver cirrhosis secondary to NASH (nonalcoholic steatohepatitis) (HCC)   Shortness of breath   Obesity, Class III, BMI 40-49.9 (morbid obesity) (HCC)   AKI (acute kidney injury) ([place])   Hyperlipidemia   Gout   Depression   Assessment and Plan:  * Chronic respiratory failure with hypoxia ([place]) - Presumed secondary to heart failure exacerbation - Status post furosemide 60 mg IV one-time dose per EDP - Ordered furosemide 40 mg IV daily, 1 doses ordered - Strict I's and O's - Patient last had a complete echo on 10/31/2022: Ejection fraction was estimated at 60 to 82%, grade 1 diastolic dysfunction  Depression - Imipramine 100 mg nightly resumed  Gout - Allopurinol 100 mg daily resumed  Hyperlipidemia - Atorvastatin 40 mg nightly resumed  AKI (  acute kidney injury) ([place]) - Suspect prerenal secondary to cardiorenal, treat per heart failure - BMP in the AM  Obesity, Class III, BMI  40-49.9 (morbid obesity) ([place]) - This complicates overall care and prognosis.   Shortness of breath - Presumed secondary to heart failure exacerbation - Treat per heart failure - Check procalcitonin, respiratory panel for COVID/influenza A/influenza B/RSV PCR, high sensitive troponin  Sleep apnea - CPAP nightly ordered  Chart reviewed.   DVT prophylaxis: Enoxaparin 40 mg subcutaneous Code Status: Full code Diet: Heart healthy Family Communication: A phone call was offered, patient declined stating that her her family knows she is in the hospital Disposition Plan: Pending clinical course; patient's goal is that she would like to return home Consults called: None at this time Admission status: Telemetry cardiac, observation  Past Medical History:  Diagnosis Date   Anemia    CHF (congestive heart failure) (HCC)    COPD (chronic obstructive pulmonary disease) (HCC)    Cough    Depression    Diaphragm paralysis    Gout    Hypertension    Hyponatremia    Insomnia    Liver disease    Nonalcoholic hepatosteatosis    PONV (postoperative nausea and vomiting)    Renal disorder    Sleep apnea    Splenic laceration    Vertigo    Past Surgical History:  Procedure Laterality Date   APPENDECTOMY     when she was 49 yearsa old   BREAST BIOPSY Left 06/17/2019   affirm bx distortion with calcs, coil clip, ADH   BREAST EXCISIONAL BIOPSY Left 09/23/2019   neg   BREAST LUMPECTOMY Left 09/23/2019   ADH   BREAST LUMPECTOMY WITH NEEDLE LOCALIZATION Left 09/23/2019   Procedure: LEFT BREAST LUMPECTOMY WITH NEEDLE LOCALIZATION;  Surgeon: Jovita Kussmaul, MD;  Location: [facility] ORS;  Service: General;  Laterality: Left;   COLONOSCOPY WITH PROPOFOL N/A 10/01/2016   Procedure: COLONOSCOPY WITH PROPOFOL;  Surgeon: Lucilla Lame, MD;  Location: [facility] ENDOSCOPY;  Service: Endoscopy;  Laterality: N/A;   ESOPHAGOGASTRODUODENOSCOPY (EGD) WITH PROPOFOL N/A 10/01/2016   Procedure:  ESOPHAGOGASTRODUODENOSCOPY (EGD) WITH PROPOFOL;  Surgeon: Lucilla Lame, MD;  Location: [facility] ENDOSCOPY;  Service: Endoscopy;  Laterality: N/A;   LIVER BIOPSY     2015   REPLACEMENT TOTAL KNEE Right 2002   TONSILLECTOMY AND ADENOIDECTOMY     when pt was 72 years old   Social History:  reports that she has quit smoking. Her smoking use included cigarettes. She uses smokeless tobacco. She reports that she does not drink alcohol and does not use drugs.  No Known Allergies Family History  Problem Relation Age of Onset   Breast cancer Sister    Breast cancer Paternal Grandmother    Breast cancer Other    Family history: Family history reviewed and not pertinent.  Prior to Admission medications   Medication Sig Start Date End Date Taking? Authorizing Provider  albuterol (PROVENTIL HFA;VENTOLIN HFA) 108 (90 Base) MCG/ACT inhaler Inhale 2 puffs into the lungs every 4 (four) hours as needed for wheezing or shortness of breath.    [provider]  alendronate (FOSAMAX) 70 MG tablet Take 70 mg by mouth every Wednesday.  08/12/19   [provider]  allopurinol (ZYLOPRIM) 100 MG tablet Take 100 mg by mouth daily. 04/11/22   [provider]  aspirin 81 MG chewable tablet Chew 81 mg by mouth at bedtime.    [provider]  atorvastatin (LIPITOR) 40 MG tablet Take  40 mg by mouth daily.     [provider]  cholecalciferol (VITAMIN D) 1000 units tablet Take 1,000 Units by mouth daily.    [provider]  ferrous sulfate 325 (65 FE) MG EC tablet Take 325 mg by mouth daily with breakfast.    [provider]  furosemide (LASIX) 40 MG tablet Take 2 tablets (80 mg total) by mouth daily. 11/15/22 11/15/23  Delma Freeze, FNP  gabapentin (NEURONTIN) 300 MG capsule Take 3 capsules (900 mg total) by mouth at bedtime. Patient taking differently: Take 900 mg by mouth 2 (two) times daily. 05/07/22   Lurene Shadow, MD  imipramine (TOFRANIL) 50 MG tablet Take 100  mg by mouth at bedtime.  08/09/16   [provider]  lisinopril (PRINIVIL,ZESTRIL) 10 MG tablet Take 10 mg by mouth daily.    [provider]  omeprazole (PRILOSEC) 40 MG capsule Take 40 mg by mouth daily.     [provider]  spironolactone (ALDACTONE) 25 MG tablet Take 1 tablet (25 mg total) by mouth daily. 10/31/22 11/30/22  Burnadette Pop, MD  tolterodine (DETROL LA) 4 MG 24 hr capsule Take 4 mg by mouth at bedtime.    [provider]   Physical Exam: Vitals:   11/22/22 1300 11/22/22 1330 11/22/22 1400 11/22/22 1535  BP: 119/69 117/72 112/70 115/67  Pulse: 92 90 93 89  Resp: 14 15 14 20   Temp:    97.6 F (36.4 C)  TempSrc:    Oral  SpO2: 100% 100% 100% 95%  Weight:    134.7 kg   Constitutional: appears age appropriate, NAD, calm, comfortable Eyes: PERRL, lids and conjunctivae normal ENMT: Mucous membranes are moist. Posterior pharynx clear of any exudate or lesions. Age-appropriate dentition. Hearing appropriate Neck: normal, supple, no masses, no thyromegaly Respiratory: clear to auscultation bilaterally, no wheezing, no crackles. Normal respiratory effort. No accessory muscle use.  Cardiovascular: Regular rate and rhythm, no murmurs / rubs / gallops. Bilateral lower extremity pitting edema. 2+ pedal pulses. No carotid bruits.  Abdomen: no tenderness, no masses palpated, no hepatosplenomegaly. Bowel sounds positive.  Musculoskeletal: no clubbing / cyanosis. No joint deformity upper and lower extremities. Good ROM, no contractures, no atrophy. Normal muscle tone.  Skin: no rashes, lesions, ulcers. No induration Neurologic: Sensation intact. Strength 5/5 in all 4.  Psychiatric: Normal judgment and insight. Alert and oriented x 3. Normal mood.   EKG: independently reviewed, showing sinus rhythm with rate of 95, QTc 475  Chest x-ray on Admission: I personally reviewed and I agree with radiologist reading as below.  DG Chest Port 1 View  Result  Date: 11/22/2022 CLINICAL DATA:  Weakness and fall. EXAM: PORTABLE CHEST 1 VIEW COMPARISON:  Chest x-ray dated October 30, 2022. FINDINGS: The patient is rotated to the left. Unchanged cardiomegaly. Increased pulmonary vascular congestion and diffuse interstitial thickening. Low lung volumes with bibasilar atelectasis. No consolidation, pneumothorax, or large pleural effusion. No acute osseous abnormality. IMPRESSION: 1. Mild interstitial pulmonary edema. Electronically Signed   By: November 01, 2022 M.D.   On: 11/22/2022 13:16   CT HEAD WO CONTRAST  Result Date: 11/22/2022 CLINICAL DATA:  Fall.  Increased weakness. EXAM: CT HEAD WITHOUT CONTRAST CT CERVICAL SPINE WITHOUT CONTRAST TECHNIQUE: Multidetector CT imaging of the head and cervical spine was performed following the standard protocol without intravenous contrast. Multiplanar CT image reconstructions of the cervical spine were also generated. RADIATION DOSE REDUCTION: This exam was performed according to the departmental dose-optimization program which includes  automated exposure control, adjustment of the mA and/or kV according to patient size and/or use of iterative reconstruction technique. COMPARISON:  Head CT 05/03/2022. FINDINGS: CT HEAD FINDINGS Brain: No acute intracranial hemorrhage. Gray-white differentiation is preserved. No mass effect or midline shift. Patchy hypoattenuation in the cerebral white matter, most consistent with chronic small vessel disease. Ventricles and sulci are within normal limits for age. No extra-axial collections. Basilar cisterns are patent. Vascular: No hyperdense vessel sign. Skull: No calvarial fracture.  Skull base is unremarkable. Sinuses/Orbits: Paranasal sinuses and mastoid air cells are well aerated. Orbits are unremarkable. Other: No soft tissue abnormalities. CT CERVICAL SPINE FINDINGS Alignment: Normal.  No traumatic listhesis. Skull base and vertebrae: No acute fracture or suspicious bone lesion. Photopenia  throughout the cervical spine mildly limits evaluation for subtle nondisplaced fracture. Soft tissues and spinal canal: No prevertebral edema or visible epidural hematoma. Disc levels: No high-grade spinal canal stenosis. Mild upper cervical facet arthropathy. Upper chest: Unremarkable. Other: None. IMPRESSION: 1. No acute intracranial injury. 2. No acute fracture or traumatic listhesis of the cervical spine. Electronically Signed   By: Orvan Falconer M.D   On: 11/22/2022 11:54   CT Cervical Spine Wo Contrast  Result Date: 11/22/2022 CLINICAL DATA:  Fall.  Increased weakness. EXAM: CT HEAD WITHOUT CONTRAST CT CERVICAL SPINE WITHOUT CONTRAST TECHNIQUE: Multidetector CT imaging of the head and cervical spine was performed following the standard protocol without intravenous contrast. Multiplanar CT image reconstructions of the cervical spine were also generated. RADIATION DOSE REDUCTION: This exam was performed according to the departmental dose-optimization program which includes automated exposure control, adjustment of the mA and/or kV according to patient size and/or use of iterative reconstruction technique. COMPARISON:  Head CT 05/03/2022. FINDINGS: CT HEAD FINDINGS Brain: No acute intracranial hemorrhage. Gray-white differentiation is preserved. No mass effect or midline shift. Patchy hypoattenuation in the cerebral white matter, most consistent with chronic small vessel disease. Ventricles and sulci are within normal limits for age. No extra-axial collections. Basilar cisterns are patent. Vascular: No hyperdense vessel sign. Skull: No calvarial fracture.  Skull base is unremarkable. Sinuses/Orbits: Paranasal sinuses and mastoid air cells are well aerated. Orbits are unremarkable. Other: No soft tissue abnormalities. CT CERVICAL SPINE FINDINGS Alignment: Normal.  No traumatic listhesis. Skull base and vertebrae: No acute fracture or suspicious bone lesion. Photopenia throughout the cervical spine mildly  limits evaluation for subtle nondisplaced fracture. Soft tissues and spinal canal: No prevertebral edema or visible epidural hematoma. Disc levels: No high-grade spinal canal stenosis. Mild upper cervical facet arthropathy. Upper chest: Unremarkable. Other: None. IMPRESSION: 1. No acute intracranial injury. 2. No acute fracture or traumatic listhesis of the cervical spine. Electronically Signed   By: Orvan Falconer M.D   On: 11/22/2022 11:54    Labs on Admission: I have personally reviewed following labs CBC: Recent Labs  Lab 11/22/22 1142  WBC 6.1  HGB 10.5*  HCT 35.7*  MCV 88.1  PLT 129*   Basic Metabolic Panel: Recent Labs  Lab 11/22/22 1142  NA 135  K 4.4  CL 103  CO2 19*  GLUCOSE 84  BUN 28*  CREATININE 1.61*  CALCIUM 10.1   GFR: CrCl cannot be calculated (Unknown ideal weight.). Liver Function Tests: Recent Labs  Lab 11/22/22 1518  AST 78*  ALT 31  ALKPHOS 87  BILITOT 1.0  PROT 7.1  ALBUMIN 3.4*   Urine analysis:    Component Value Date/Time   COLORURINE YELLOW (A) 10/30/2022 1212   APPEARANCEUR HAZY (A) 10/30/2022  1212   APPEARANCEUR Clear 04/24/2012 2129   LABSPEC 1.005 10/30/2022 1212   LABSPEC 1.012 04/24/2012 2129   PHURINE 6.0 10/30/2022 1212   GLUCOSEU NEGATIVE 10/30/2022 1212   GLUCOSEU Negative 04/24/2012 2129   HGBUR NEGATIVE 10/30/2022 1212   BILIRUBINUR NEGATIVE 10/30/2022 1212   BILIRUBINUR Negative 04/24/2012 2129   KETONESUR NEGATIVE 10/30/2022 1212   PROTEINUR NEGATIVE 10/30/2022 1212   NITRITE NEGATIVE 10/30/2022 1212   LEUKOCYTESUR SMALL (A) 10/30/2022 1212   LEUKOCYTESUR Negative 04/24/2012 2129   This document was prepared using Dragon Voice Recognition software and may include unintentional dictation errors.  Dr. Sedalia Muta [facility]  If 7PM-7AM, please contact overnight-coverage provider If 7AM-7PM, please contact day coverage provider [email or web address]  11/22/2022, 5:16 PM

## 2022-11-22 NOTE — ED Notes (Signed)
Pt reports she wears 2L [place] oxygen at home - placed on home oxygen   Dr. Corky Downs at bedside for evaluation.

## 2022-11-22 NOTE — ED Triage Notes (Signed)
Pt comes via EMS  with c/o weakness. Pt did also have fall at 6am today and had been on floor until family came and found her. Pt denies any pain or injuries.   Pt states increased weakness and now can't ambulate how she used to.

## 2022-11-22 NOTE — Assessment & Plan Note (Signed)
-  Allopurinol 100 mg daily resumed

## 2022-11-22 NOTE — Assessment & Plan Note (Addendum)
-  Presumed secondary to heart failure exacerbation - Status post furosemide 60 mg IV one-time dose per EDP - Ordered furosemide 40 mg IV daily, 1 doses ordered - Strict I's and O's - Patient last had a complete echo on 10/31/2022: Ejection fraction was estimated at 60 to 98%, grade 1 diastolic dysfunction

## 2022-11-23 DIAGNOSIS — G4733 Obstructive sleep apnea (adult) (pediatric): Secondary | ICD-10-CM

## 2022-11-23 DIAGNOSIS — N179 Acute kidney failure, unspecified: Secondary | ICD-10-CM

## 2022-11-23 DIAGNOSIS — R531 Weakness: Secondary | ICD-10-CM

## 2022-11-23 DIAGNOSIS — K7460 Unspecified cirrhosis of liver: Secondary | ICD-10-CM

## 2022-11-23 DIAGNOSIS — E7220 Disorder of urea cycle metabolism, unspecified: Secondary | ICD-10-CM

## 2022-11-23 DIAGNOSIS — W19XXXA Unspecified fall, initial encounter: Secondary | ICD-10-CM | POA: Diagnosis not present

## 2022-11-23 DIAGNOSIS — Y92009 Unspecified place in unspecified non-institutional (private) residence as the place of occurrence of the external cause: Secondary | ICD-10-CM

## 2022-11-23 DIAGNOSIS — R0602 Shortness of breath: Secondary | ICD-10-CM

## 2022-11-23 DIAGNOSIS — R748 Abnormal levels of other serum enzymes: Secondary | ICD-10-CM

## 2022-11-23 DIAGNOSIS — D638 Anemia in other chronic diseases classified elsewhere: Secondary | ICD-10-CM

## 2022-11-23 DIAGNOSIS — K7581 Nonalcoholic steatohepatitis (NASH): Secondary | ICD-10-CM

## 2022-11-23 DIAGNOSIS — D509 Iron deficiency anemia, unspecified: Secondary | ICD-10-CM

## 2022-11-23 LAB — CBC
HCT: 33.8 % — ABNORMAL LOW (ref 36.0–46.0)
Hemoglobin: 10 g/dL — ABNORMAL LOW (ref 12.0–15.0)
MCH: 25.8 pg — ABNORMAL LOW (ref 26.0–34.0)
MCHC: 29.6 g/dL — ABNORMAL LOW (ref 30.0–36.0)
MCV: 87.1 fL (ref 80.0–100.0)
Platelets: 133 10*3/uL — ABNORMAL LOW (ref 150–400)
RBC: 3.88 MIL/uL (ref 3.87–5.11)
RDW: 24.7 % — ABNORMAL HIGH (ref 11.5–15.5)
WBC: 4.6 10*3/uL (ref 4.0–10.5)
nRBC: 0 % (ref 0.0–0.2)

## 2022-11-23 LAB — BASIC METABOLIC PANEL
Anion gap: 9 (ref 5–15)
BUN: 35 mg/dL — ABNORMAL HIGH (ref 8–23)
CO2: 25 mmol/L (ref 22–32)
Calcium: 9.7 mg/dL (ref 8.9–10.3)
Chloride: 102 mmol/L (ref 98–111)
Creatinine, Ser: 1.74 mg/dL — ABNORMAL HIGH (ref 0.44–1.00)
GFR, Estimated: 31 mL/min — ABNORMAL LOW (ref >60)
Glucose, Bld: 86 mg/dL (ref 70–99)
Potassium: 4.3 mmol/L (ref 3.5–5.1)
Sodium: 136 mmol/L (ref 135–145)

## 2022-11-23 LAB — BRAIN NATRIURETIC PEPTIDE: B Natriuretic Peptide: 15.7 pg/mL (ref 0.0–100.0)

## 2022-11-23 LAB — CK: Total CK: 234 U/L (ref 38–234)

## 2022-11-23 LAB — AMMONIA: Ammonia: 91 umol/L — ABNORMAL HIGH (ref 9–35)

## 2022-11-23 MED ORDER — LACTATED RINGERS IV SOLN: INTRAVENOUS | Status: DC

## 2022-11-23 MED ORDER — LACTULOSE 10 GM/15ML PO SOLN
20.0000 g | Freq: Two times a day (BID) | ORAL | Status: DC
  Administered 2022-11-23 – 2022-11-26 (×6): 20 g via ORAL
  Filled 2022-11-23 (×7): qty 30

## 2022-11-23 NOTE — ED Notes (Signed)
OT at bedside. 

## 2022-11-23 NOTE — ED Notes (Signed)
Pt o2 saturation dropping to 84% on room air. Pt denies current difficulty breathing. O2 probe changed and pt placed on 2LNC.

## 2022-11-23 NOTE — ED Notes (Signed)
Pt is a little more confused and tired. When RN asked patient what year it is pt repeatedly said January. Pt states she feels fine. Pt awake eating dinner. MD [place] notified.

## 2022-11-23 NOTE — Evaluation (Signed)
Physical Therapy Evaluation Patient Details Name: Carla Huerta MRN: [id number] DOB: 01-02-1951 Today's Date: 11/23/2022  History of Present Illness  Pt admitted to [facility] on 11/22/22 for c/o weakness, and mechanical fall (hitting head) with inability to get off floor. Dx with chronic respiratory failure with hypoxia. Significant PMH includes: obesity, COPD, depression, gout, HTN, sleep apnea (CPAP), liver cirrhosis.   Clinical Impression  Pt is a 72 year old F admitted to hospital on 11/22/22 for weakness and a fall. At baseline, pt was mod I with ADL's, limited ambulation with rollator, simple meals, and driving PRN; family to assist with IADL's, transportation, and medication management.   Pt presents with obesity, decreased activity tolerance, impaired cardiopulmonary tolerance to activity, decreased gross balance, stress incontinence, functional weakness, impaired skin integrity, and impaired safety awareness, resulting in impaired functional mobility from baseline. Due to deficits, pt required supervision for bed mobility, CGA for safety with transfers, and CGA to ambulate 11ft x2 with RW. Increased time/effort required during session for pericare, linen change, and clean up from multiple episodes of urinary stress incontinence. Decreased activity tolerance and cardiopulmonary tolerance to activity demonstrated by labored breathing, elevated HR, elevated RR, and RPE of 4-6/10 indicating "moderate activity" after minimal mobility. Concern for yeast infection of L abdominal skin fold due to foul odor and redness; care team notified. Deficits limit the pt's ability to safely and independently perform ADL's, transfer, and ambulate. Pt will benefit from acute skilled PT services to address deficits for return to baseline function.   Per pt, spouse is disabled and uses eWC majority of the time. Daughter present in the home with 3 children (ages 49, 51, and 2); pt stating that daughter will be able to provide  24/7 care at DC. Pt functioning below baseline and will likely need assistance with functional mobility, ADL's, and IADL's at DC. Therefore, PT recommends SNF at DC to address deficits and improve overall safety with functional mobility prior to return home. Pt seems hesitant about this recommendation. If she declines PT original recommendation, will recommend return home with HHPT and Aide, as well as a BSC for energy conservation and safety with functional mobility.      Recommendations for follow up therapy are one component of a multi-disciplinary discharge planning process, led by the attending physician.  Recommendations may be updated based on patient status, additional functional criteria and insurance authorization.  Follow Up Recommendations Skilled nursing-short term rehab (<3 hours/day) Can patient physically be transported by private vehicle: No    Assistance Recommended at Discharge Intermittent Supervision/Assistance  Patient can return home with the following  A little help with walking and/or transfers;A little help with bathing/dressing/bathroom;Assistance with cooking/housework;Direct supervision/assist for medications management;Assist for transportation;Help with stairs or ramp for entrance    Equipment Recommendations BSC/3in1     Functional Status Assessment Patient has had a recent decline in their functional status and demonstrates the ability to make significant improvements in function in a reasonable and predictable amount of time.     Precautions / Restrictions Precautions Precautions: Fall Restrictions Weight Bearing Restrictions: No      Mobility  Bed Mobility Overal bed mobility: Needs Assistance Bed Mobility: Supine to Sit, Sit to Supine     Supine to sit: Supervision, HOB elevated Sit to supine: Supervision   General bed mobility comments: for safety to perform transfer, increased time/effort with reliance on bedrail    Transfers Overall  transfer level: Needs assistance Equipment used: Rolling walker (2 wheels) Transfers: Sit to/from  Stand Sit to Stand: Min guard           General transfer comment: CGA for safety to stand from EOB and recliner with RW; increased time/effort    Ambulation/Gait Ambulation/Gait assistance: Min guard Gait Distance (Feet): 10 Feet (63ft x2) Assistive device: Rolling walker (2 wheels)         General Gait Details: CGA for safety to ambulate short distance in room with RW. Demonstrates decreased step length/foot clearance, slowed cadence, forward flexed posture, decreased RW proximity, and labored breathing. Seated rest break between bouts. SpO2 at therapeutic level with elevated HR around 106 bpm and elevated RR in 20's.      Balance Overall balance assessment: Needs assistance Sitting-balance support: Bilateral upper extremity supported, Feet supported Sitting balance-Leahy Scale: Fair     Standing balance support: Reliant on assistive device for balance, Bilateral upper extremity supported, During functional activity Standing balance-Leahy Scale: Fair Standing balance comment: increased reliance on RW for mobility                             Pertinent Vitals/Pain Pain Assessment Pain Assessment: No/denies pain    Home Living Family/patient expects to be discharged to:: Private residence Living Arrangements: Spouse/significant other;Children Available Help at Discharge: Family;Available 24 hours/day (spouse is disabled and uses eWC most of the time; daughter available 24/7 to care) Type of Home: House Home Access: Ramped entrance       Home Layout: One level Home Equipment: [place] (4 wheels);Tub bench      Prior Function Prior Level of Function : Independent/Modified Independent             Mobility Comments: Mod I limited ambulation with rollator; hx falls ADLs Comments: Mod I with ADL's and simple meals. Spouse and daughter to assist with  medication management, IADL's, and transportation. Pt states she can drive sometimes.        Extremity/Trunk Assessment   Upper Extremity Assessment Upper Extremity Assessment: Overall WFL for tasks assessed    Lower Extremity Assessment Lower Extremity Assessment: Generalized weakness    Cervical / Trunk Assessment Cervical / Trunk Assessment: Normal  Communication   Communication: No difficulties  Cognition Arousal/Alertness: Awake/alert Behavior During Therapy: WFL for tasks assessed/performed Overall Cognitive Status: Within Functional Limits for tasks assessed                                 General Comments: Intermittently keeps eyes closed during session; impaired processing noted but pt denies having difficulty with thought process, finding words, and talking.        General Comments General comments (skin integrity, edema, etc.): redness under L skin fold, concerning for yeast infection    Exercises Other Exercises Other Exercises: Participates in bed mobility, transfers, and minimal gait. Increased time/effort during session for hygiene after multiple episodes of urinary stress incontinence. Other Exercises: Pt educated re: PT role/POC, DC recommendations, safety with functional mobility, benefits of OOB mobility, and energy conservation.   Assessment/Plan    PT Assessment Patient needs continued PT services  PT Problem List Decreased strength;Decreased activity tolerance;Decreased balance;Decreased mobility;Decreased safety awareness;Decreased skin integrity;Obesity;Cardiopulmonary status limiting activity       PT Treatment Interventions DME instruction;Gait training;Functional mobility training;Therapeutic activities;Therapeutic exercise;Balance training;Neuromuscular re-education    PT Goals (Current goals can be found in the Care Plan section)  Acute Rehab PT Goals Patient Stated Goal: "  walking" PT Goal Formulation: With patient Time For  Goal Achievement: 12/07/22 Potential to Achieve Goals: Good    Frequency Min 2X/week        AM-PAC PT "6 Clicks" Mobility  Outcome Measure Help needed turning from your back to your side while in a flat bed without using bedrails?: A Little Help needed moving from lying on your back to sitting on the side of a flat bed without using bedrails?: A Little Help needed moving to and from a bed to a chair (including a wheelchair)?: A Little Help needed standing up from a chair using your arms (e.g., wheelchair or bedside chair)?: A Little Help needed to walk in hospital room?: A Little Help needed climbing 3-5 steps with a railing? : A Lot 6 Click Score: 17    End of Session Equipment Utilized During Treatment: Gait belt Activity Tolerance: Patient limited by fatigue Patient left: in bed;with call bell/phone within reach;with bed alarm set Nurse Communication: Mobility status PT Visit Diagnosis: Unsteadiness on feet (R26.81);History of falling (Z91.81);Muscle weakness (generalized) (M62.81);Difficulty in walking, not elsewhere classified (R26.2)    Time: 6160-7371 PT Time Calculation (min) (ACUTE ONLY): 41 min   Charges:   PT Evaluation $PT Eval Low Complexity: 1 Low PT Treatments $Therapeutic Activity: 8-22 mins        Vira Blanco, PT, DPT 3:38 PM,11/23/22 Physical Therapist - [facility]

## 2022-11-23 NOTE — ED Notes (Addendum)
Pt bilateral legs appear more mottled and cool than this morning. Pt fingers cold to touch as well. Pt o2 saturation remains 95-98% on room air. Pt denies difficulty breathing. MD [place] notified.

## 2022-11-23 NOTE — ED Notes (Signed)
Pt purewick and diaper changed.

## 2022-11-23 NOTE — ED Notes (Signed)
Pt given meal tray and is sitting up eating.  

## 2022-11-23 NOTE — ED Notes (Signed)
Patient resting. Rt at bedside placing patient's night time CPAP on.

## 2022-11-23 NOTE — ED Notes (Signed)
Patient had large bowel movement. Patient's linens changed at this time. Patient placed in clean gown, clean brief, and placed clean purewick on.

## 2022-11-23 NOTE — ED Notes (Signed)
Assumed care from Alex,RN. Pt resting comfortably in bed at this time. Pt denies any current needs or questions. Call light with in reach.   

## 2022-11-23 NOTE — ED Notes (Signed)
MD [place] notified of pt ammonia level being 91.

## 2022-11-23 NOTE — ED Notes (Signed)
Pt husband called for update on pt, RN answered husbands questions. No additional concerns at this time.

## 2022-11-23 NOTE — Evaluation (Signed)
Occupational Therapy Evaluation Patient Details Name: Carla Huerta MRN: [id number] DOB: 07/15/1951 Today's Date: 11/23/2022   History of Present Illness Pt admitted to [facility] on 11/22/22 for c/o weakness, and mechanical fall (hitting head) with inability to get off floor. Dx with chronic respiratory failure with hypoxia. Significant PMH includes: obesity, COPD, depression, gout, HTN, sleep apnea (CPAP), liver cirrhosis.   Clinical Impression   Patient presenting with decreased independence in self care, balance, functional mobility/transfers, endurance, and safety awareness. PTA pt lived with disabled spouse, daughter, and small grandchildren (ages 48, 84, and 2). Pt reports her daughter will be available to provide 24/7 supervision/assistance upon discharge. Pt is normally Mod I for ADLs and light meal prep, daughter assists with IADLs (transportation, medication management). Pt currently functioning at supervision for bed mobility, Min guard to stand from EOB, and CGA-Min A to take lateral steps at EOB using RW. Pt required Mod A to complete seated LB dressing. Anticipate increased assistance for standing ADL tasks. Pt with some mild confusion during evaluation, could benefit from clarification of home equipment. She required frequent rest breaks t/o and endorsed fatigue. Pt was educated on energy conservation techniques with handout provided explaining the 4 P's (plan, prioritize, pace, position) as well as strategies for how to conserve energy for specific ADL tasks. Will continue to address during hospital stay as pt would benefit from further opportunities to practice implementing energy conservation techniques during self-care tasks. Pt will benefit from acute OT to increase overall independence in the areas of ADLs and functional mobility in order to safely discharge to next venue of care. Recommend STR upon discharge at this time, however, if pt declines she will benefit from [facility], aide, and a BSC  for safe discharge home.   Recommendations for follow up therapy are one component of a multi-disciplinary discharge planning process, led by the attending physician.  Recommendations may be updated based on patient status, additional functional criteria and insurance authorization.   Follow Up Recommendations  Skilled nursing-short term rehab (<3 hours/day)     Assistance Recommended at Discharge Intermittent Supervision/Assistance  Patient can return home with the following A little help with walking and/or transfers;Assistance with cooking/housework;Assist for transportation;Help with stairs or ramp for entrance;A little help with bathing/dressing/bathroom;Direct supervision/assist for medications management    Functional Status Assessment  Patient has had a recent decline in their functional status and demonstrates the ability to make significant improvements in function in a reasonable and predictable amount of time.  Equipment Recommendations  BSC/3in1    Recommendations for Other Services       Precautions / Restrictions Precautions Precautions: Fall Restrictions Weight Bearing Restrictions: No      Mobility Bed Mobility Overal bed mobility: Needs Assistance Bed Mobility: Supine to Sit, Sit to Supine     Supine to sit: Supervision, HOB elevated Sit to supine: Supervision   General bed mobility comments: increased time/effort required, use of bedrail    Transfers Overall transfer level: Needs assistance Equipment used: Rolling walker (2 wheels) Transfers: Sit to/from Stand Sit to Stand: Min guard                  Balance Overall balance assessment: Needs assistance Sitting-balance support: Bilateral upper extremity supported, Feet supported Sitting balance-Leahy Scale: Fair     Standing balance support: Reliant on assistive device for balance, Bilateral upper extremity supported, During functional activity Standing balance-Leahy Scale: Fair  ADL either performed or assessed with clinical judgement   ADL Overall ADL's : Needs assistance/impaired     Grooming: Set up;Sitting               Lower Body Dressing: Moderate assistance;Sitting/lateral leans Lower Body Dressing Details (indicate cue type and reason): for socks Toilet Transfer: Rolling walker (2 wheels);Min guard Toilet Transfer Details (indicate cue type and reason): simulated         Functional mobility during ADLs: Min guard;Minimal assistance;Rolling walker (2 wheels) (to take ~5 lateral steps at EOB)       Vision Patient Visual Report: No change from baseline       Perception     Praxis      Pertinent Vitals/Pain Pain Assessment Pain Assessment: No/denies pain     Hand Dominance     Extremity/Trunk Assessment Upper Extremity Assessment Upper Extremity Assessment: Overall WFL for tasks assessed   Lower Extremity Assessment Lower Extremity Assessment: Generalized weakness   Cervical / Trunk Assessment Cervical / Trunk Assessment: Normal   Communication Communication Communication: No difficulties   Cognition Arousal/Alertness: Awake/alert Behavior During Therapy: WFL for tasks assessed/performed Overall Cognitive Status: No family/caregiver present to determine baseline cognitive functioning                                 General Comments: Pt oriented to self, initially stating the day of the week is Saturday, oriented to month, unable to state year. Intermittently keeps eyes closed during session; impaired processing noted but pt denies having difficulty with thought process, finding words, and talking.     General Comments  redness under L skin fold, concerning for yeast infection    Exercises Other Exercises Other Exercises: OT provided education re: role of OT, OT POC, post acute recs, sitting up for all meals, EOB/OOB mobility with assistance, home/fall safety, energy  conservation strategies with handout provided   Shoulder Instructions      Home Living Family/patient expects to be discharged to:: Private residence Living Arrangements: Spouse/significant other;Children Available Help at Discharge: Family;Available 24 hours/day (spouse is disabled and uses eWC most of the time; daughter available 24/7 to care) Type of Home: House Home Access: Ramped entrance     Home Layout: One level     Bathroom Shower/Tub: Teacher, early years/pre: Standard     Home Equipment: Rollator (4 wheels);Tub bench          Prior Functioning/Environment Prior Level of Function : Independent/Modified Independent;History of Falls (last six months)             Mobility Comments: Mod I limited ambulation with rollator; hx falls ADLs Comments: Mod I with ADL's and simple meals. Spouse and daughter to assist with medication management, IADL's, and transportation. Pt states she can drive sometimes.        OT Problem List: Decreased strength;Decreased activity tolerance;Impaired balance (sitting and/or standing);Obesity;Cardiopulmonary status limiting activity;Decreased safety awareness      OT Treatment/Interventions: Self-care/ADL training;Therapeutic exercise;Therapeutic activities;Energy conservation;DME and/or AE instruction;Patient/family education;Balance training;Cognitive remediation/compensation    OT Goals(Current goals can be found in the care plan section) Acute Rehab OT Goals Patient Stated Goal: return home OT Goal Formulation: With patient Time For Goal Achievement: 12/07/22 Potential to Achieve Goals: Good   OT Frequency: Min 2X/week    Co-evaluation              AM-PAC OT "6 Clicks" Daily Activity  Outcome Measure Help from another person eating meals?: None Help from another person taking care of personal grooming?: A Little Help from another person toileting, which includes using toliet, bedpan, or urinal?: A  Lot Help from another person bathing (including washing, rinsing, drying)?: A Lot Help from another person to put on and taking off regular upper body clothing?: A Little Help from another person to put on and taking off regular lower body clothing?: A Lot 6 Click Score: 16   End of Session Equipment Utilized During Treatment: Gait belt;Rolling walker (2 wheels) Nurse Communication: Mobility status  Activity Tolerance: Patient limited by fatigue Patient left: in bed;with call bell/phone within reach;with bed alarm set  OT Visit Diagnosis: Unsteadiness on feet (R26.81);History of falling (Z91.81);Muscle weakness (generalized) (M62.81)                Time: 4034-7425 OT Time Calculation (min): 24 min Charges:  OT General Charges $OT Visit: 1 Visit OT Evaluation $OT Eval Low Complexity: 1 Low  [facility] MS, OTR/L ascom [phone]  11/23/22, 6:03 PM

## 2022-11-23 NOTE — ED Notes (Signed)
Pt finished with breakfast and cleaned up. Pt states she would like to go back to sleep for a little while and wants her cpap back on. Pt resting in bed comfortably with cpap on.

## 2022-11-23 NOTE — ED Notes (Signed)
Pt at bedside working with patient. Pt urinated and cleaned up.

## 2022-11-23 NOTE — ED Notes (Signed)
Pt resting comfortably. Purewick in place. Pt states she doesn't feel like she needs to urinate again. Pt blanket changed.

## 2022-11-23 NOTE — Progress Notes (Signed)
PROGRESS NOTE  Carla Huerta [id number] DOB: [birth year]   PCP: Denton Lank, MD  Patient is from: Home.  Lives with husband and daughter.  Uses rollator at baseline.  DOA: 11/22/2022 LOS: 0  Chief complaints Chief Complaint  Patient presents with   Weakness     Brief Narrative / Interim history: 72 year old F with PMH of COPD, OSA on CPAP, liver cirrhosis, morbid obesity, depression, HTN and gout presenting with generalized weakness and shortness of breath for about a week, and admitted with working diagnosis of chronic respiratory failure with hypoxia, shortness of breath and AKI. Cr 1.6 (baseline 1.1).  CXR raised concern for mild interstitial pulmonary edema.  Patient was started on IV Lasix for possible acute CHF.  She also had a fall at home.  CT head and cervical spine without acute finding.  Next day, creatinine trended up to 1.74.  Diuretics discontinued.  Subjective: Seen and examined earlier this morning.  No major events overnight of this morning.  She reports bilateral lower extremity weakness and intermittent shortness of breath.  She denies chest pain or cough.  Denies GI or UTI symptoms.  Objective: Vitals:   11/23/22 0900 11/23/22 1100 11/23/22 1200 11/23/22 1517  BP: 127/74 (!) 106/54 98/72 104/65  Pulse: 96 90 92 94  Resp: 16 14 19 20   Temp:   98 F (36.7 C) 97.9 F (36.6 C)  TempSrc:      SpO2: 98% 94% 91% 93%  Weight:        Examination:  GENERAL: No apparent distress.  Nontoxic. HEENT: MMM.  Vision and hearing grossly intact.  NECK: Supple.  No apparent JVD.  RESP:  No IWOB.  Fair aeration bilaterally. CVS:  RRR. Heart sounds normal.  ABD/GI/GU: BS+. Abd soft, NTND.  MSK/EXT:  Moves extremities. No apparent deformity. No edema.  SKIN: no apparent skin lesion or wound NEURO: Awake, alert and oriented appropriately.  No apparent focal neuro deficit. PSYCH: Calm. Normal affect.   Procedures:  None  Microbiology summarized: [id number],  influenza and RSV PCR nonreactive.  Assessment and plan: Principal Problem:   AKI (acute kidney injury) ([place]) Active Problems:   Iron deficiency anemia   Atypical ductal hyperplasia of left breast   Chronic respiratory failure with hypoxia (HCC)   Sleep apnea   Liver cirrhosis secondary to NASH (nonalcoholic steatohepatitis) (HCC)   Shortness of breath   Obesity, Class III, BMI 40-49.9 (morbid obesity) ([place])   Hyperlipidemia   Gout   Depression   Fall at home, initial encounter  AKI on CKD-3A: Suspect prerenal etiology.  Does not appear fluid overloaded.  BNP 15.7. Recent Labs    05/03/22 1246 05/04/22 0658 05/07/22 0553 10/30/22 1212 10/31/22 0432 11/22/22 1142 11/23/22 0550  BUN 11 12 15 15 15  28* 35*  CREATININE 1.22* 1.11* 1.05* 1.10* 1.08* 1.61* 1.74*  -Discontinue diuretics.  Hold home Lasix, Aldactone and lisinopril. -IV LR at 75 cc an hour -Recheck renal function in the morning  Generalized weakness/fall at home: No obvious injury.  CT head/cervical spine without acute finding. -PT/OT recommends SNF.  Anemia of chronic disease: Hgb higher than baseline likely due to hemoconcentration. Recent Labs    05/03/22 1246 05/04/22 0658 05/05/22 0656 05/07/22 0553 10/30/22 1212 10/30/22 2022 10/31/22 0432 11/22/22 1142 11/23/22 0550  HGB 11.6* 10.8* 10.9* 10.5* 7.6* 7.7* 7.2* 10.5* 10.0*  -IV fluid as above -Check anemia panel   Hyperammonia: Mental status intact. -Lactulose 20 g twice daily  Elevated AST: 78.  ALT  normal. -Check CK.  Respiratory failure resolved.  No documented desaturation.  She was saturating at 100% on 2 L.  Now on room air.   Depression: Stable. -Continue home medications.   Gout -Continue allopurinol 100 mg daily resumed   Hyperlipidemia -Atorvastatin 40 mg nightly resumed.  May stop if CK elevated.   OSA on CPAP -Continue nightly CPAP.  Mild thrombocytopenia: -Monitor.  Urinary incontinence: -Toviaz while in-house.    Morbid obesity Body mass index is 49.41 kg/m.          DVT prophylaxis:  enoxaparin (LOVENOX) injection 40 mg Start: 11/22/22 2200 Place TED hose Start: 11/22/22 1456  Code Status: Full code Family Communication: None at bedside Level of care: Telemetry Cardiac Status is: Observation The patient will require care spanning > 2 midnights and should be moved to inpatient because: AKI/dehydration as evidenced by signet currently elevated creatinine from baseline and hemoconcentration   Final disposition: SNF Consultants:  None  55 minutes with more than 50% spent in reviewing records, counseling patient/family and coordinating care.   Sch Meds:  Scheduled Meds:  allopurinol  100 mg Oral Daily   aspirin  81 mg Oral QHS   atorvastatin  40 mg Oral QHS   enoxaparin (LOVENOX) injection  40 mg Subcutaneous Q24H   ferrous sulfate  325 mg Oral Q breakfast   fesoterodine  4 mg Oral QHS   gabapentin  300 mg Oral Q0600   imipramine  100 mg Oral QHS   lactulose  20 g Oral BID   pantoprazole  80 mg Oral Daily   Continuous Infusions: PRN Meds:.acetaminophen **OR** acetaminophen, albuterol, ondansetron **OR** ondansetron (ZOFRAN) IV, senna-docusate  Antimicrobials: Anti-infectives (From admission, onward)    None        I have personally reviewed the following labs and images: CBC: Recent Labs  Lab 11/22/22 1142 11/23/22 0550  WBC 6.1 4.6  HGB 10.5* 10.0*  HCT 35.7* 33.8*  MCV 88.1 87.1  PLT 129* 133*   BMP &GFR Recent Labs  Lab 11/22/22 1142 11/23/22 0550  NA 135 136  K 4.4 4.3  CL 103 102  CO2 19* 25  GLUCOSE 84 86  BUN 28* 35*  CREATININE 1.61* 1.74*  CALCIUM 10.1 9.7   CrCl cannot be calculated (Unknown ideal weight.). Liver & Pancreas: Recent Labs  Lab 11/22/22 1518  AST 78*  ALT 31  ALKPHOS 87  BILITOT 1.0  PROT 7.1  ALBUMIN 3.4*   No results for input(s): "LIPASE", "AMYLASE" in the last 168 hours. Recent Labs  Lab 11/23/22 1010   AMMONIA 91*   Diabetic: No results for input(s): "HGBA1C" in the last 72 hours. No results for input(s): "GLUCAP" in the last 168 hours. Cardiac Enzymes: No results for input(s): "CKTOTAL", "CKMB", "CKMBINDEX", "TROPONINI" in the last 168 hours. No results for input(s): "PROBNP" in the last 8760 hours. Coagulation Profile: No results for input(s): "INR", "PROTIME" in the last 168 hours. Thyroid Function Tests: No results for input(s): "TSH", "T4TOTAL", "FREET4", "T3FREE", "THYROIDAB" in the last 72 hours. Lipid Profile: No results for input(s): "CHOL", "HDL", "LDLCALC", "TRIG", "CHOLHDL", "LDLDIRECT" in the last 72 hours. Anemia Panel: No results for input(s): "VITAMINB12", "FOLATE", "FERRITIN", "TIBC", "IRON", "RETICCTPCT" in the last 72 hours. Urine analysis:    Component Value Date/Time   COLORURINE YELLOW (A) 11/22/2022 2240   APPEARANCEUR HAZY (A) 11/22/2022 2240   APPEARANCEUR Clear 04/24/2012 2129   LABSPEC 1.005 11/22/2022 2240   LABSPEC 1.012 04/24/2012 2129   PHURINE 7.0 11/22/2022  2240   GLUCOSEU NEGATIVE 11/22/2022 2240   GLUCOSEU Negative 04/24/2012 2129   HGBUR NEGATIVE 11/22/2022 2240   BILIRUBINUR NEGATIVE 11/22/2022 2240   BILIRUBINUR Negative 04/24/2012 2129   KETONESUR NEGATIVE 11/22/2022 2240   PROTEINUR NEGATIVE 11/22/2022 2240   NITRITE NEGATIVE 11/22/2022 2240   LEUKOCYTESUR TRACE (A) 11/22/2022 2240   LEUKOCYTESUR Negative 04/24/2012 2129   Sepsis Labs: Invalid input(s): "PROCALCITONIN", "LACTICIDVEN"  Microbiology: Recent Results (from the past 240 hour(s))  Resp panel by RT-PCR (RSV, Flu A&B, Covid) Anterior Nasal Swab     Status: None   Collection Time: 11/22/22  6:18 PM   Specimen: Anterior Nasal Swab  Result Value Ref Range Status   SARS Coronavirus 2 by RT PCR NEGATIVE NEGATIVE Final    Comment: (NOTE) SARS-CoV-2 target nucleic acids are NOT DETECTED.  The SARS-CoV-2 RNA is generally detectable in upper respiratory specimens during the  acute phase of infection. The lowest concentration of SARS-CoV-2 viral copies this assay can detect is 138 copies/mL. A negative result does not preclude SARS-Cov-2 infection and should not be used as the sole basis for treatment or other patient management decisions. A negative result may occur with  improper specimen collection/handling, submission of specimen other than nasopharyngeal swab, presence of viral mutation(s) within the areas targeted by this assay, and inadequate number of viral copies(<138 copies/mL). A negative result must be combined with clinical observations, patient history, and epidemiological information. The expected result is Negative.  Fact Sheet for Patients:  BloggerCourse.com  Fact Sheet for Healthcare Providers:  SeriousBroker.it  This test is no t yet approved or cleared by the [place] FDA and  has been authorized for detection and/or diagnosis of SARS-CoV-2 by FDA under an Emergency Use Authorization (EUA). This EUA will remain  in effect (meaning this test can be used) for the duration of the COVID-19 declaration under Section 564(b)(1) of the Act, 21 U.S.C.section 360bbb-3(b)(1), unless the authorization is terminated  or revoked sooner.       Influenza A by PCR NEGATIVE NEGATIVE Final   Influenza B by PCR NEGATIVE NEGATIVE Final    Comment: (NOTE) The Xpert Xpress SARS-CoV-2/FLU/RSV plus assay is intended as an aid in the diagnosis of influenza from Nasopharyngeal swab specimens and should not be used as a sole basis for treatment. Nasal washings and aspirates are unacceptable for Xpert Xpress SARS-CoV-2/FLU/RSV testing.  Fact Sheet for Patients: BloggerCourse.com  Fact Sheet for Healthcare Providers: SeriousBroker.it  This test is not yet approved or cleared by the [place] FDA and has been authorized for detection and/or  diagnosis of SARS-CoV-2 by FDA under an Emergency Use Authorization (EUA). This EUA will remain in effect (meaning this test can be used) for the duration of the COVID-19 declaration under Section 564(b)(1) of the Act, 21 U.S.C. section 360bbb-3(b)(1), unless the authorization is terminated or revoked.     Resp Syncytial Virus by PCR NEGATIVE NEGATIVE Final    Comment: (NOTE) Fact Sheet for Patients: BloggerCourse.com  Fact Sheet for Healthcare Providers: SeriousBroker.it  This test is not yet approved or cleared by the [place] FDA and has been authorized for detection and/or diagnosis of SARS-CoV-2 by FDA under an Emergency Use Authorization (EUA). This EUA will remain in effect (meaning this test can be used) for the duration of the COVID-19 declaration under Section 564(b)(1) of the Act, 21 U.S.C. section 360bbb-3(b)(1), unless the authorization is terminated or revoked.  Performed at [facility], [address], [place], [place] [address]  Radiology Studies: No results found.    Ettel Albergo T. [place]  If 7PM-7AM, please contact night-coverage [email or web address] 11/23/2022, 3:42 PM

## 2022-11-24 ENCOUNTER — Encounter: Payer: Self-pay | Admitting: Student

## 2022-11-24 DIAGNOSIS — Z79899 Other long term (current) drug therapy: Secondary | ICD-10-CM | POA: Diagnosis not present

## 2022-11-24 DIAGNOSIS — E860 Dehydration: Secondary | ICD-10-CM | POA: Diagnosis present

## 2022-11-24 DIAGNOSIS — F32A Depression, unspecified: Secondary | ICD-10-CM | POA: Diagnosis present

## 2022-11-24 DIAGNOSIS — D61818 Other pancytopenia: Secondary | ICD-10-CM | POA: Diagnosis present

## 2022-11-24 DIAGNOSIS — Y92009 Unspecified place in unspecified non-institutional (private) residence as the place of occurrence of the external cause: Secondary | ICD-10-CM | POA: Diagnosis not present

## 2022-11-24 DIAGNOSIS — R0602 Shortness of breath: Secondary | ICD-10-CM | POA: Diagnosis not present

## 2022-11-24 DIAGNOSIS — E785 Hyperlipidemia, unspecified: Secondary | ICD-10-CM | POA: Diagnosis present

## 2022-11-24 DIAGNOSIS — D631 Anemia in chronic kidney disease: Secondary | ICD-10-CM | POA: Diagnosis present

## 2022-11-24 DIAGNOSIS — I130 Hypertensive heart and chronic kidney disease with heart failure and stage 1 through stage 4 chronic kidney disease, or unspecified chronic kidney disease: Secondary | ICD-10-CM | POA: Diagnosis present

## 2022-11-24 DIAGNOSIS — N1831 Chronic kidney disease, stage 3a: Secondary | ICD-10-CM | POA: Diagnosis present

## 2022-11-24 DIAGNOSIS — D638 Anemia in other chronic diseases classified elsewhere: Secondary | ICD-10-CM | POA: Diagnosis not present

## 2022-11-24 DIAGNOSIS — W19XXXA Unspecified fall, initial encounter: Secondary | ICD-10-CM | POA: Diagnosis not present

## 2022-11-24 DIAGNOSIS — N6092 Unspecified benign mammary dysplasia of left breast: Secondary | ICD-10-CM | POA: Diagnosis present

## 2022-11-24 DIAGNOSIS — R531 Weakness: Secondary | ICD-10-CM | POA: Diagnosis present

## 2022-11-24 DIAGNOSIS — M109 Gout, unspecified: Secondary | ICD-10-CM | POA: Diagnosis present

## 2022-11-24 DIAGNOSIS — J9611 Chronic respiratory failure with hypoxia: Secondary | ICD-10-CM | POA: Diagnosis present

## 2022-11-24 DIAGNOSIS — L304 Erythema intertrigo: Secondary | ICD-10-CM | POA: Diagnosis present

## 2022-11-24 DIAGNOSIS — G4733 Obstructive sleep apnea (adult) (pediatric): Secondary | ICD-10-CM | POA: Diagnosis present

## 2022-11-24 DIAGNOSIS — I509 Heart failure, unspecified: Secondary | ICD-10-CM | POA: Diagnosis present

## 2022-11-24 DIAGNOSIS — R32 Unspecified urinary incontinence: Secondary | ICD-10-CM | POA: Diagnosis present

## 2022-11-24 DIAGNOSIS — N179 Acute kidney failure, unspecified: Secondary | ICD-10-CM | POA: Diagnosis present

## 2022-11-24 DIAGNOSIS — J449 Chronic obstructive pulmonary disease, unspecified: Secondary | ICD-10-CM | POA: Diagnosis present

## 2022-11-24 DIAGNOSIS — W010XXA Fall on same level from slipping, tripping and stumbling without subsequent striking against object, initial encounter: Secondary | ICD-10-CM | POA: Diagnosis present

## 2022-11-24 DIAGNOSIS — Z6841 Body Mass Index (BMI) 40.0 and over, adult: Secondary | ICD-10-CM | POA: Diagnosis not present

## 2022-11-24 DIAGNOSIS — K7581 Nonalcoholic steatohepatitis (NASH): Secondary | ICD-10-CM | POA: Diagnosis present

## 2022-11-24 DIAGNOSIS — Z720 Tobacco use: Secondary | ICD-10-CM | POA: Diagnosis not present

## 2022-11-24 DIAGNOSIS — K7460 Unspecified cirrhosis of liver: Secondary | ICD-10-CM | POA: Diagnosis present

## 2022-11-24 DIAGNOSIS — D509 Iron deficiency anemia, unspecified: Secondary | ICD-10-CM | POA: Diagnosis present

## 2022-11-24 DIAGNOSIS — Z1152 Encounter for screening for COVID-19: Secondary | ICD-10-CM | POA: Diagnosis not present

## 2022-11-24 LAB — COMPREHENSIVE METABOLIC PANEL
ALT: 29 U/L (ref 0–44)
AST: 55 U/L — ABNORMAL HIGH (ref 15–41)
Albumin: 3.3 g/dL — ABNORMAL LOW (ref 3.5–5.0)
Alkaline Phosphatase: 83 U/L (ref 38–126)
Anion gap: 9 (ref 5–15)
BUN: 34 mg/dL — ABNORMAL HIGH (ref 8–23)
CO2: 25 mmol/L (ref 22–32)
Calcium: 9.8 mg/dL (ref 8.9–10.3)
Chloride: 103 mmol/L (ref 98–111)
Creatinine, Ser: 1.73 mg/dL — ABNORMAL HIGH (ref 0.44–1.00)
GFR, Estimated: 31 mL/min — ABNORMAL LOW (ref >60)
Glucose, Bld: 97 mg/dL (ref 70–99)
Potassium: 4.2 mmol/L (ref 3.5–5.1)
Sodium: 137 mmol/L (ref 135–145)
Total Bilirubin: 0.9 mg/dL (ref 0.3–1.2)
Total Protein: 6.8 g/dL (ref 6.5–8.1)

## 2022-11-24 LAB — CBC
HCT: 34.7 % — ABNORMAL LOW (ref 36.0–46.0)
Hemoglobin: 10.2 g/dL — ABNORMAL LOW (ref 12.0–15.0)
MCH: 25.7 pg — ABNORMAL LOW (ref 26.0–34.0)
MCHC: 29.4 g/dL — ABNORMAL LOW (ref 30.0–36.0)
MCV: 87.4 fL (ref 80.0–100.0)
Platelets: 126 10*3/uL — ABNORMAL LOW (ref 150–400)
RBC: 3.97 MIL/uL (ref 3.87–5.11)
RDW: 25 % — ABNORMAL HIGH (ref 11.5–15.5)
WBC: 4.9 10*3/uL (ref 4.0–10.5)
nRBC: 0 % (ref 0.0–0.2)

## 2022-11-24 LAB — CK: Total CK: 324 U/L — ABNORMAL HIGH (ref 38–234)

## 2022-11-24 LAB — AMMONIA: Ammonia: 66 umol/L — ABNORMAL HIGH (ref 9–35)

## 2022-11-24 LAB — MAGNESIUM: Magnesium: 1.9 mg/dL (ref 1.7–2.4)

## 2022-11-24 MED ORDER — NYSTATIN 100000 UNIT/GM EX POWD
Freq: Four times a day (QID) | CUTANEOUS | Status: DC | PRN
  Filled 2022-11-24: qty 15

## 2022-11-24 MED ORDER — LACTATED RINGERS IV SOLN: INTRAVENOUS | Status: DC

## 2022-11-24 MED ORDER — NYSTATIN 100000 UNIT/GM EX POWD
Freq: Two times a day (BID) | CUTANEOUS | Status: DC
  Administered 2022-11-24: 1 via TOPICAL
  Filled 2022-11-24 (×2): qty 15

## 2022-11-24 NOTE — Plan of Care (Signed)

## 2022-11-24 NOTE — ED Notes (Signed)
Advised nurse that patient has ready bed 

## 2022-11-24 NOTE — ED Notes (Signed)
when changing patient, patient noted to have reddened/irritated area under left breast.yellow drainage and foul odor noted. [name], NP made aware.

## 2022-11-24 NOTE — ED Notes (Signed)
Peri care provided and clean brief placed on patient.

## 2022-11-24 NOTE — Progress Notes (Signed)
PROGRESS NOTE  Carla Huerta TDD:[id number] DOB: Oct 12, 1951   PCP: Hillery Aldo, MD  Patient is from: Home.  Lives with husband and daughter.  Uses rollator at baseline.  DOA: 11/22/2022 LOS: 0  Chief complaints Chief Complaint  Patient presents with   Weakness     Brief Narrative / Interim history: 72 year old F with PMH of COPD, OSA on CPAP, liver cirrhosis, morbid obesity, depression, HTN and gout presenting with generalized weakness and shortness of breath for about a week, and admitted with working diagnosis of chronic respiratory failure with hypoxia, shortness of breath and AKI. Cr 1.6 (baseline 1.1).  CXR raised concern for mild interstitial pulmonary edema.  Patient was started on IV Lasix for possible acute CHF.  She also had a fall at home.  CT head and cervical spine without acute finding.  Next day, creatinine trended up to 1.74.  Diuretics discontinued.  Started on IV fluid.  Subjective: Seen and examined earlier this morning.  No major events overnight of this morning.  Feels better.  Denies shortness of breath lying in bed.  Patient's daughter at bedside.  Objective: Vitals:   11/24/22 0630 11/24/22 0700 11/24/22 0732 11/24/22 1118  BP: 126/68 (!) 103/57 (!) 108/48 129/67  Pulse: 89 89 89 90  Resp: 15 13 16 19   Temp:   98.8 F (37.1 C) 97.9 F (36.6 C)  TempSrc:   Oral   SpO2: 98% 96% 96% 96%  Weight:        Examination:  GENERAL: No apparent distress.  Nontoxic. HEENT: MMM.  Vision and hearing grossly intact.  NECK: Supple.  No apparent JVD.  RESP:  No IWOB.  Fair aeration bilaterally. CVS:  RRR. Heart sounds normal.  ABD/GI/GU: BS+. Abd soft, NTND.  MSK/EXT:  Moves extremities. No apparent deformity. No edema.  SKIN: Slight lines of erythema under both breasts.  No ulcer or yellowish drainage NEURO: Awake and alert. Oriented appropriately.  No apparent focal neuro deficit. PSYCH: Calm. Normal affect.   Patient's RN, and patient's  daughter present as chaperone during breast exam.  Procedures:  None  Microbiology summarized: COVID-19, influenza and RSV PCR nonreactive.  Assessment and plan: Principal Problem:   AKI (acute kidney injury) (HCC) Active Problems:   Iron deficiency anemia   Atypical ductal hyperplasia of left breast   Sleep apnea   Liver cirrhosis secondary to NASH (nonalcoholic steatohepatitis) (HCC)   Shortness of breath   Obesity, Class III, BMI 40-49.9 (morbid obesity) (HCC)   Hyperlipidemia   Gout   Depression   Fall at home, initial encounter   Anemia of chronic disease   Generalized weakness  AKI on CKD-3A: Suspect prerenal etiology.  Does not appear fluid overloaded.  BNP 15.7.  Creatinine seems to have plateaued at 1.7. Recent Labs    05/03/22 1246 05/04/22 0658 05/07/22 0553 10/30/22 1212 10/31/22 0432 11/22/22 1142 11/23/22 0550 11/24/22 0349  BUN 11 12 15 15 15  28* 35* 34*  CREATININE 1.22* 1.11* 1.05* 1.10* 1.08* 1.61* 1.74* 1.73*  -Continue holding home Lasix, Aldactone and lisinopril. -Continue IV LR at 75 cc an hour for another 24 hours -Recheck renal function in the morning  Generalized weakness/fall at home: No obvious injury.  CT head/cervical spine without acute finding. -PT/OT recommends SNF.  Anemia of chronic disease: Hgb higher than baseline likely due to hemoconcentration. Recent Labs    05/03/22 1246 05/04/22 05/05/22 05/05/22 6237 05/07/22 0553 10/30/22 1212 10/30/22 2022 10/31/22 0432 11/22/22 1142 11/23/22 0550 11/24/22  0349  HGB 11.6* 10.8* 10.9* 10.5* 7.6* 7.7* 7.2* 10.5* 10.0* 10.2*  -IV fluid as above -Check anemia panel  Hyperammonia: Mental status intact.  Improved from 91-66. -Lactulose 20 g twice daily  Elevated AST/liver cirrhosis without ascites: AST proved.  CK within normal. -Monitor intermittently  Respiratory failure resolved.  No documented desaturation.  Now 96% on 2 L -Wean off oxygen. -Incentive spirometry    Depression: Stable. -Continue home medications.   Gout -Continue allopurinol 100 mg daily resumed   Hyperlipidemia -Atorvastatin 40 mg nightly resumed.    OSA on CPAP -Continue nightly CPAP.  Mild thrombocytopenia: -Monitor.  Urinary incontinence: -Toviaz while in-house.  Mild bilateral intertrigo: -Recommend irrigation and nystatin powder as needed   Morbid obesity Body mass index is 49.41 kg/m.          DVT prophylaxis:  enoxaparin (LOVENOX) injection 40 mg Start: 11/22/22 2200 Place TED hose Start: 11/22/22 1456  Code Status: Full code Family Communication: Updated patient's daughter at bedside. Level of care: Med-Surg Status is: Observation The patient will require care spanning > 2 midnights and should be moved to inpatient because: AKI/dehydration as evidenced by signet currently elevated creatinine from baseline and hemoconcentration   Final disposition: SNF Consultants:  None  55 minutes with more than 50% spent in reviewing records, counseling patient/family and coordinating care.   Sch Meds:  Scheduled Meds:  allopurinol  100 mg Oral Daily   aspirin  81 mg Oral QHS   atorvastatin  40 mg Oral QHS   enoxaparin (LOVENOX) injection  40 mg Subcutaneous Q24H   ferrous sulfate  325 mg Oral Q breakfast   fesoterodine  4 mg Oral QHS   gabapentin  300 mg Oral Q0600   imipramine  100 mg Oral QHS   lactulose  20 g Oral BID   pantoprazole  80 mg Oral Daily   Continuous Infusions:  lactated ringers 75 mL/hr at 11/24/22 0800   PRN Meds:.acetaminophen **OR** acetaminophen, albuterol, nystatin, ondansetron **OR** ondansetron (ZOFRAN) IV, senna-docusate  Antimicrobials: Anti-infectives (From admission, onward)    None        I have personally reviewed the following labs and images: CBC: Recent Labs  Lab 11/22/22 1142 11/23/22 0550 11/24/22 0349  WBC 6.1 4.6 4.9  HGB 10.5* 10.0* 10.2*  HCT 35.7* 33.8* 34.7*  MCV 88.1 87.1 87.4  PLT 129*  133* 126*   BMP &GFR Recent Labs  Lab 11/22/22 1142 11/23/22 0550 11/24/22 0349  NA 135 136 137  K 4.4 4.3 4.2  CL 103 102 103  CO2 19* 25 25  GLUCOSE 84 86 97  BUN 28* 35* 34*  CREATININE 1.61* 1.74* 1.73*  CALCIUM 10.1 9.7 9.8  MG  --   --  1.9   CrCl cannot be calculated (Unknown ideal weight.). Liver & Pancreas: Recent Labs  Lab 11/22/22 1518 11/24/22 0349  AST 78* 55*  ALT 31 29  ALKPHOS 87 83  BILITOT 1.0 0.9  PROT 7.1 6.8  ALBUMIN 3.4* 3.3*   No results for input(s): "LIPASE", "AMYLASE" in the last 168 hours. Recent Labs  Lab 11/23/22 1010 11/24/22 0349  AMMONIA 91* 66*   Diabetic: No results for input(s): "HGBA1C" in the last 72 hours. No results for input(s): "GLUCAP" in the last 168 hours. Cardiac Enzymes: Recent Labs  Lab 11/23/22 1010 11/24/22 0349  CKTOTAL 234 324*   No results for input(s): "PROBNP" in the last 8760 hours. Coagulation Profile: No results for input(s): "INR", "PROTIME" in the  last 168 hours. Thyroid Function Tests: No results for input(s): "TSH", "T4TOTAL", "FREET4", "T3FREE", "THYROIDAB" in the last 72 hours. Lipid Profile: No results for input(s): "CHOL", "HDL", "LDLCALC", "TRIG", "CHOLHDL", "LDLDIRECT" in the last 72 hours. Anemia Panel: No results for input(s): "VITAMINB12", "FOLATE", "FERRITIN", "TIBC", "IRON", "RETICCTPCT" in the last 72 hours. Urine analysis:    Component Value Date/Time   COLORURINE YELLOW (A) 11/22/2022 2240   APPEARANCEUR HAZY (A) 11/22/2022 2240   APPEARANCEUR Clear 04/24/2012 2129   LABSPEC 1.005 11/22/2022 2240   LABSPEC 1.012 04/24/2012 2129   PHURINE 7.0 11/22/2022 2240   GLUCOSEU NEGATIVE 11/22/2022 2240   GLUCOSEU Negative 04/24/2012 2129   HGBUR NEGATIVE 11/22/2022 [place] 11/22/2022 2240   BILIRUBINUR Negative 04/24/2012 2129   KETONESUR NEGATIVE 11/22/2022 2240   PROTEINUR NEGATIVE 11/22/2022 2240   NITRITE NEGATIVE 11/22/2022 2240   LEUKOCYTESUR TRACE (A)  11/22/2022 2240   LEUKOCYTESUR Negative 04/24/2012 2129   Sepsis Labs: Invalid input(s): "PROCALCITONIN", "LACTICIDVEN"  Microbiology: Recent Results (from the past 240 hour(s))  Resp panel by RT-PCR (RSV, Flu A&B, Covid) Anterior Nasal Swab     Status: None   Collection Time: 11/22/22  6:18 PM   Specimen: Anterior Nasal Swab  Result Value Ref Range Status   SARS Coronavirus 2 by RT PCR NEGATIVE NEGATIVE Final    Comment: (NOTE) SARS-CoV-2 target nucleic acids are NOT DETECTED.  The SARS-CoV-2 RNA is generally detectable in upper respiratory specimens during the acute phase of infection. The lowest concentration of SARS-CoV-2 viral copies this assay can detect is 138 copies/mL. A negative result does not preclude SARS-Cov-2 infection and should not be used as the sole basis for treatment or other patient management decisions. A negative result may occur with  improper specimen collection/handling, submission of specimen other than nasopharyngeal swab, presence of viral mutation(s) within the areas targeted by this assay, and inadequate number of viral copies(<138 copies/mL). A negative result must be combined with clinical observations, patient history, and epidemiological information. The expected result is Negative.  Fact Sheet for Patients:  EntrepreneurPulse.com.au  Fact Sheet for Healthcare Providers:  IncredibleEmployment.be  This test is no t yet approved or cleared by the [place] FDA and  has been authorized for detection and/or diagnosis of SARS-CoV-2 by FDA under an Emergency Use Authorization (EUA). This EUA will remain  in effect (meaning this test can be used) for the duration of the COVID-19 declaration under Section 564(b)(1) of the Act, 21 U.S.C.section 360bbb-3(b)(1), unless the authorization is terminated  or revoked sooner.       Influenza A by PCR NEGATIVE NEGATIVE Final   Influenza B by PCR NEGATIVE NEGATIVE  Final    Comment: (NOTE) The Xpert Xpress SARS-CoV-2/FLU/RSV plus assay is intended as an aid in the diagnosis of influenza from Nasopharyngeal swab specimens and should not be used as a sole basis for treatment. Nasal washings and aspirates are unacceptable for Xpert Xpress SARS-CoV-2/FLU/RSV testing.  Fact Sheet for Patients: EntrepreneurPulse.com.au  Fact Sheet for Healthcare Providers: IncredibleEmployment.be  This test is not yet approved or cleared by the [place] FDA and has been authorized for detection and/or diagnosis of SARS-CoV-2 by FDA under an Emergency Use Authorization (EUA). This EUA will remain in effect (meaning this test can be used) for the duration of the COVID-19 declaration under Section 564(b)(1) of the Act, 21 U.S.C. section 360bbb-3(b)(1), unless the authorization is terminated or revoked.     Resp Syncytial Virus by PCR NEGATIVE NEGATIVE Final  Comment: (NOTE) Fact Sheet for Patients: BloggerCourse.com  Fact Sheet for Healthcare Providers: SeriousBroker.it  This test is not yet approved or cleared by the [place] FDA and has been authorized for detection and/or diagnosis of SARS-CoV-2 by FDA under an Emergency Use Authorization (EUA). This EUA will remain in effect (meaning this test can be used) for the duration of the COVID-19 declaration under Section 564(b)(1) of the Act, 21 U.S.C. section 360bbb-3(b)(1), unless the authorization is terminated or revoked.  Performed at [facility], [address], [place], [place] [address]     Radiology Studies: No results found.    Jaydyn Bozzo T. Eunique Balik [facility]  If 7PM-7AM, please contact night-coverage [email or web address] 11/24/2022, 3:08 PM

## 2022-11-25 DIAGNOSIS — N179 Acute kidney failure, unspecified: Secondary | ICD-10-CM | POA: Diagnosis not present

## 2022-11-25 DIAGNOSIS — W19XXXA Unspecified fall, initial encounter: Secondary | ICD-10-CM | POA: Diagnosis not present

## 2022-11-25 DIAGNOSIS — D638 Anemia in other chronic diseases classified elsewhere: Secondary | ICD-10-CM | POA: Diagnosis not present

## 2022-11-25 DIAGNOSIS — R0602 Shortness of breath: Secondary | ICD-10-CM | POA: Diagnosis not present

## 2022-11-25 LAB — MAGNESIUM: Magnesium: 2 mg/dL (ref 1.7–2.4)

## 2022-11-25 LAB — CBC
HCT: 33.3 % — ABNORMAL LOW (ref 36.0–46.0)
Hemoglobin: 9.9 g/dL — ABNORMAL LOW (ref 12.0–15.0)
MCH: 25.9 pg — ABNORMAL LOW (ref 26.0–34.0)
MCHC: 29.7 g/dL — ABNORMAL LOW (ref 30.0–36.0)
MCV: 87.2 fL (ref 80.0–100.0)
Platelets: 127 10*3/uL — ABNORMAL LOW (ref 150–400)
RBC: 3.82 MIL/uL — ABNORMAL LOW (ref 3.87–5.11)
RDW: 24.5 % — ABNORMAL HIGH (ref 11.5–15.5)
WBC: 3.8 10*3/uL — ABNORMAL LOW (ref 4.0–10.5)
nRBC: 0 % (ref 0.0–0.2)

## 2022-11-25 LAB — COMPREHENSIVE METABOLIC PANEL
ALT: 26 U/L (ref 0–44)
AST: 48 U/L — ABNORMAL HIGH (ref 15–41)
Albumin: 3.1 g/dL — ABNORMAL LOW (ref 3.5–5.0)
Alkaline Phosphatase: 83 U/L (ref 38–126)
Anion gap: 8 (ref 5–15)
BUN: 30 mg/dL — ABNORMAL HIGH (ref 8–23)
CO2: 26 mmol/L (ref 22–32)
Calcium: 9.7 mg/dL (ref 8.9–10.3)
Chloride: 104 mmol/L (ref 98–111)
Creatinine, Ser: 1.71 mg/dL — ABNORMAL HIGH (ref 0.44–1.00)
GFR, Estimated: 32 mL/min — ABNORMAL LOW (ref >60)
Glucose, Bld: 80 mg/dL (ref 70–99)
Potassium: 4.8 mmol/L (ref 3.5–5.1)
Sodium: 138 mmol/L (ref 135–145)
Total Bilirubin: 1.1 mg/dL (ref 0.3–1.2)
Total Protein: 6.7 g/dL (ref 6.5–8.1)

## 2022-11-25 LAB — AMMONIA: Ammonia: 39 umol/L — ABNORMAL HIGH (ref 9–35)

## 2022-11-25 LAB — CK: Total CK: 294 U/L — ABNORMAL HIGH (ref 38–234)

## 2022-11-25 NOTE — Progress Notes (Signed)
Physical Therapy Treatment Patient Details Name: Carla Huerta MRN: [id number] DOB: October 13, 1951 Today's Date: 11/25/2022   History of Present Illness Pt admitted to [facility] on 11/22/22 for c/o weakness, and mechanical fall (hitting head) with inability to get off floor. Dx with chronic respiratory failure with hypoxia. Significant PMH includes: obesity, COPD, depression, gout, HTN, sleep apnea (CPAP), liver cirrhosis.    PT Comments    Pt received in bed and agreeable to PT. Discussed pt's current LOF and provided education on benefits of HHPT and continued strengthening upon returning home. Pt tolerated gait training in room well, however was limited due to urine incontinence. Bariatric size rollator delivered to room for safe mobility upon d/c home with HHPT services. Continue PT per POC    Recommendations for follow up therapy are one component of a multi-disciplinary discharge planning process, led by the attending physician.  Recommendations may be updated based on patient status, additional functional criteria and insurance authorization.  Follow Up Recommendations  Skilled nursing-short term rehab (<3 hours/day) Can patient physically be transported by private vehicle: Yes   Assistance Recommended at Discharge Intermittent Supervision/Assistance  Patient can return home with the following A little help with walking and/or transfers;A little help with bathing/dressing/bathroom;Assistance with cooking/housework;Direct supervision/assist for medications management;Assist for transportation;Help with stairs or ramp for entrance   Equipment Recommendations  Rollator (4 wheels)    Recommendations for Other Services       Precautions / Restrictions Precautions Precautions: Fall Restrictions Weight Bearing Restrictions: No     Mobility  Bed Mobility Overal bed mobility: Needs Assistance Bed Mobility: Supine to Sit, Sit to Supine     Supine to sit: Supervision, HOB elevated Sit  to supine: Supervision   General bed mobility comments: increased time/effort required, use of bedrail    Transfers Overall transfer level: Needs assistance Equipment used: Rolling walker (2 wheels) Transfers: Sit to/from Stand Sit to Stand: Min guard           General transfer comment: CGA for safety to stand from EOB and recliner with RW; increased time/effort    Ambulation/Gait Ambulation/Gait assistance: Min guard Gait Distance (Feet):  (10) Assistive device: Rolling walker (2 wheels) Gait Pattern/deviations: Step-to pattern, Wide base of support       General Gait Details: Distance limited due to pt being incontinent of urine while ambulating   Stairs Stairs:  (Pt has a ramp at home)           Wheelchair Mobility    Modified Rankin (Stroke Patients Only)       Balance Overall balance assessment: Needs assistance Sitting-balance support: Bilateral upper extremity supported, Feet supported Sitting balance-Leahy Scale: Good     Standing balance support: Reliant on assistive device for balance, Bilateral upper extremity supported, During functional activity Standing balance-Leahy Scale: Fair Standing balance comment: increased reliance on RW for mobility                            Cognition Arousal/Alertness: Awake/alert Behavior During Therapy: WFL for tasks assessed/performed Overall Cognitive Status: No family/caregiver present to determine baseline cognitive functioning                                 General Comments:  (Pt very pleasant and motivated)        Exercises General Exercises - Lower Extremity Ankle Circles/Pumps: AROM, Both, 10 reps Long Arc  Quad: AROM, Both, 10 reps Hip Flexion/Marching: AROM, Both, 10 reps, Seated    General Comments General comments (skin integrity, edema, etc.):  (Pt educated on role of PT, proper size and use of Rollator, and safe mobility to prevent falls.)      Pertinent  Vitals/Pain Pain Assessment Pain Assessment: No/denies pain    Home Living                          Prior Function            PT Goals (current goals can now be found in the care plan section) Acute Rehab PT Goals Patient Stated Goal: "walking" Progress towards PT goals: Progressing toward goals    Frequency    Min 2X/week      PT Plan Current plan remains appropriate    Co-evaluation              AM-PAC PT "6 Clicks" Mobility   Outcome Measure  Help needed turning from your back to your side while in a flat bed without using bedrails?: A Little Help needed moving from lying on your back to sitting on the side of a flat bed without using bedrails?: A Little Help needed moving to and from a bed to a chair (including a wheelchair)?: A Little Help needed standing up from a chair using your arms (e.g., wheelchair or bedside chair)?: A Little Help needed to walk in hospital room?: A Little Help needed climbing 3-5 steps with a railing? : A Lot 6 Click Score: 17    End of Session Equipment Utilized During Treatment: Gait belt Activity Tolerance: Patient limited by fatigue (Urine incontinence) Patient left: in chair;with call bell/phone within reach;Other (comment) (Pt educated to call for help with any needs) Nurse Communication: Mobility status PT Visit Diagnosis: Unsteadiness on feet (R26.81);History of falling (Z91.81);Muscle weakness (generalized) (M62.81);Difficulty in walking, not elsewhere classified (R26.2)     Time: 1610-9604 PT Time Calculation (min) (ACUTE ONLY): 42 min  Charges:  $Gait Training: 8-22 mins $Therapeutic Exercise: 8-22 mins $Therapeutic Activity: 8-22 mins                    [name], PTA    [name] 11/25/2022, 4:42 PM

## 2022-11-25 NOTE — Discharge Instructions (Signed)

## 2022-11-25 NOTE — Plan of Care (Signed)
  Problem: Education: Goal: Ability to demonstrate management of disease process will improve Outcome: Progressing Goal: Ability to verbalize understanding of medication therapies will improve Outcome: Progressing Goal: Individualized Educational Video(s) Outcome: Progressing   Problem: Activity: Goal: Capacity to carry out activities will improve Outcome: Progressing   Problem: Cardiac: Goal: Ability to achieve and maintain adequate cardiopulmonary perfusion will improve Outcome: Progressing   Problem: Health Behavior/Discharge Planning: Goal: Ability to manage health-related needs will improve Outcome: Progressing   Problem: Clinical Measurements: Goal: Ability to maintain clinical measurements within normal limits will improve Outcome: Progressing Goal: Will remain free from infection Outcome: Progressing Goal: Diagnostic test results will improve Outcome: Progressing Goal: Respiratory complications will improve Outcome: Progressing Goal: Cardiovascular complication will be avoided Outcome: Progressing   Problem: Activity: Goal: Risk for activity intolerance will decrease Outcome: Progressing   Problem: Nutrition: Goal: Adequate nutrition will be maintained Outcome: Progressing   Problem: Coping: Goal: Level of anxiety will decrease Outcome: Progressing   Problem: Pain Managment: Goal: General experience of comfort will improve Outcome: Progressing   Problem: Safety: Goal: Ability to remain free from injury will improve Outcome: Progressing

## 2022-11-25 NOTE — TOC Initial Note (Signed)
Transition of Care [facility]) - Initial/Assessment Note    Patient Details  Name: Carla Huerta MRN: [id number] Date of Birth: 04-Jun-1951  Transition of Care [facility]) CM/SW Contact:    Tiburcio Bash, LCSW Phone Number: 11/25/2022, 10:48 AM  Clinical Narrative:                  CSW spoke with patient regarding SNF recommendation. She declines SNF at this time and reports having family support at home including her spouse and daughter. Patient agreeable to [facility] services of PT and RN, no preference of agency. CSW has sent referral to [facility] with [place].   Patient reports she does not need a 3in1/BSC however is interested in rollator. CSW has ordered via Suanne Marker with [employer] to prepare for potential dc tomorrow. Patient's family to pick her up tomorrow.     Expected Discharge Plan: [place] Barriers to Discharge: Continued Medical Work up   Patient Goals and CMS Choice Patient states their goals for this hospitalization and ongoing recovery are:: to go home CMS Medicare.gov Compare Post Acute Care list provided to:: Patient Choice offered to / list presented to : Patient      Expected Discharge Plan and Services       Living arrangements for the past 2 months: Single Family Home                 DME Arranged: Gilford Rile DME Agency: [employer] Date DME Agency Contacted: 11/25/22 Time DME Agency Contacted: 64 Representative spoke with at DME Agency: Suanne Marker HH Arranged: PT, RN [place] Agency: [place] Date [place]: 11/25/22 Time [place]: 5 Representative spoke with at [place] Arrangements/Services Living arrangements for the past 2 months: [place] Lives with:: Relatives, Spouse   Do you feel safe going back to the place where you live?: Yes               Activities of Daily Living Home Assistive Devices/Equipment: CPAP ADL Screening (condition at time of admission) Patient's cognitive  ability adequate to safely complete daily activities?: Yes Is the patient deaf or have difficulty hearing?: No Does the patient have difficulty seeing, even when wearing glasses/contacts?: No Does the patient have difficulty concentrating, remembering, or making decisions?: No Patient able to express need for assistance with ADLs?: No Does the patient have difficulty dressing or bathing?: No Independently performs ADLs?: Yes (appropriate for developmental age) Does the patient have difficulty walking or climbing stairs?: Yes Weakness of Legs: None Weakness of Arms/Hands: None  Permission Sought/Granted                  Emotional Assessment   Attitude/Demeanor/Rapport: Gracious Affect (typically observed): Calm Orientation: : Oriented to Self, Oriented to Place, Oriented to  Time, Oriented to Situation Alcohol / Substance Use: Not Applicable Psych Involvement: No (comment)  Admission diagnosis:  Shortness of breath [R06.02] Acute pulmonary edema ([place]) [J81.0] Weakness [R53.1] AKI (acute kidney injury) ([place]) [N17.9] Patient Active Problem List   Diagnosis Date Noted   Fall at home, initial encounter 11/23/2022   Anemia of chronic disease 11/23/2022   Generalized weakness 11/23/2022   Shortness of breath 11/22/2022   Obesity, Class III, BMI 40-49.9 (morbid obesity) ([place]) 11/22/2022   AKI (acute kidney injury) ([place]) 11/22/2022   Hyperlipidemia 11/22/2022   Gout 11/22/2022   Depression 11/22/2022   Sleep apnea 10/31/2022   Liver cirrhosis secondary to NASH (nonalcoholic steatohepatitis) ([place])  10/31/2022   Acute CHF (congestive heart failure) ([place]) 10/30/2022   Acute hepatic encephalopathy ([place]) 05/04/2022   Sepsis ([place]) 05/03/2022   Acute pyelonephritis 05/03/2022   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/12/2019   Iron deficiency anemia 10/14/2016   Blood loss anemia    Hernia, hiatal    Pancytopenia ([place]) 07/25/2016   [place]    PCP:  Denton Lank,  MD Pharmacy:   [facility] [phone] [address], [place] [address] [place] [address] Phone: [phone] Fax: [phone]     Social Determinants of Health (SDOH) Social History: SDOH Screenings   Food Insecurity: No Food Insecurity (11/24/2022)  Housing: Low Risk  (11/24/2022)  Transportation Needs: No Transportation Needs (11/24/2022)  Utilities: Not At Risk (11/24/2022)  Tobacco Use: High Risk (11/24/2022)   SDOH Interventions:     Readmission Risk Interventions     No data to display

## 2022-11-25 NOTE — Plan of Care (Signed)
  Problem: Education: Goal: Ability to demonstrate management of disease process will improve Outcome: Progressing Goal: Ability to verbalize understanding of medication therapies will improve Outcome: Progressing Goal: Individualized Educational Video(s) Outcome: Progressing   Problem: Activity: Goal: Capacity to carry out activities will improve Outcome: Progressing   Problem: Cardiac: Goal: Ability to achieve and maintain adequate cardiopulmonary perfusion will improve Outcome: Progressing   Problem: Education: Goal: Knowledge of General Education information will improve Description: Including pain rating scale, medication(s)/side effects and non-pharmacologic comfort measures Outcome: Progressing   Problem: Health Behavior/Discharge Planning: Goal: Ability to manage health-related needs will improve Outcome: Progressing   Problem: Clinical Measurements: Goal: Ability to maintain clinical measurements within normal limits will improve Outcome: Progressing Goal: Will remain free from infection Outcome: Progressing Goal: Diagnostic test results will improve Outcome: Progressing Goal: Respiratory complications will improve Outcome: Progressing Goal: Cardiovascular complication will be avoided Outcome: Progressing

## 2022-11-25 NOTE — Progress Notes (Signed)
PROGRESS NOTE  Carla Huerta VHQ:[id number] DOB: [birth year]   PCP: Denton Lank, MD  Patient is from: Home.  Lives with husband and daughter.  Uses rollator at baseline.  DOA: 11/22/2022 LOS: 1  Chief complaints Chief Complaint  Patient presents with   Weakness     Brief Narrative / Interim history: 72 year old F with PMH of COPD, OSA on CPAP, liver cirrhosis, morbid obesity, depression, HTN and gout presenting with generalized weakness and shortness of breath for about a week, and admitted with working diagnosis of chronic respiratory failure with hypoxia, shortness of breath and AKI. Cr 1.6 (baseline 1.1).  CXR raised concern for mild interstitial pulmonary edema.  Patient was started on IV Lasix for possible acute CHF.  She also had a fall at home.  CT head and cervical spine without acute finding.  Next day, creatinine trended up to 1.74.  Diuretics discontinued.  Started on IV fluid.  Creatinine seems to have plateaued at 1.7 despite IV fluid.  Therapy recommended SNF.  Subjective: Seen and examined earlier this morning.  No major events overnight of this morning.  No complaints.  Feels better.  Denies chest pain, shortness of breath, GI or UTI symptoms.  Creatinine seems to have plateaued at 1.7.  Objective: Vitals:   11/24/22 2314 11/24/22 2317 11/25/22 0500 11/25/22 0721  BP: (!) 101/50 (!) 101/50  125/64  Pulse: 91 91  95  Resp:  20  18  Temp:  98.2 F (36.8 C)  97.8 F (36.6 C)  TempSrc:  Oral    SpO2: 93% 93%  97%  Weight:   135.5 kg     Examination:  GENERAL: No apparent distress.  Nontoxic. HEENT: MMM.  Vision and hearing grossly intact.  NECK: Supple.  No apparent JVD.  RESP:  No IWOB.  Fair aeration bilaterally. CVS:  RRR. Heart sounds normal.  ABD/GI/GU: BS+. Abd soft, NTND.  MSK/EXT:  Moves extremities. No apparent deformity. No edema.  SKIN: no apparent skin lesion or wound NEURO: Awake and alert. Oriented appropriately.  No apparent focal neuro  deficit. PSYCH: Calm. Normal affect.   Procedures:  None  Microbiology summarized: UXLKG-40, influenza and RSV PCR nonreactive.  Assessment and plan: Principal Problem:   AKI (acute kidney injury) ([place]) Active Problems:   Iron deficiency anemia   Atypical ductal hyperplasia of left breast   Sleep apnea   Liver cirrhosis secondary to NASH (nonalcoholic steatohepatitis) ([facility])   Shortness of breath   Obesity, Class III, BMI 40-49.9 (morbid obesity) ([place])   Hyperlipidemia   Gout   Depression   Fall at home, initial encounter   Anemia of chronic disease   Generalized weakness  AKI on CKD-3A: Suspect prerenal etiology.  Does not appear fluid overloaded.  BNP 15.7.  Creatinine seems to have plateaued at 1.7. Recent Labs    05/03/22 1246 05/04/22 0658 05/07/22 0553 10/30/22 1212 10/31/22 0432 11/22/22 1142 11/23/22 0550 11/24/22 0349 11/25/22 0725  BUN 11 12 15 15 15  28* 35* 34* 30*  CREATININE 1.22* 1.11* 1.05* 1.10* 1.08* 1.61* 1.74* 1.73* 1.71*  -Continue holding home Lasix, Aldactone and lisinopril. -Likely discharge with outpatient follow-up if Cr stable or improves off IVF  Generalized weakness/fall at home: No obvious injury.  CT head/cervical spine without acute finding. -PT/OT recommends SNF but patient prefers to go home with home health and DME  Anemia of chronic disease: Hgb higher than baseline likely due to hemoconcentration. Recent Labs    05/04/22 1027 05/05/22 2536 05/07/22 6440  10/30/22 1212 10/30/22 2022 10/31/22 0432 11/22/22 1142 11/23/22 0550 11/24/22 0349 11/25/22 0725  HGB 10.8* 10.9* 10.5* 7.6* 7.7* 7.2* 10.5* 10.0* 10.2* 9.9*  -Check anemia panel. -Discontinue IV fluid  Hyperammonia: Mental status intact.  Improved from 91>> 48 -Continue lactulose 20 g twice daily  Elevated AST/liver cirrhosis without ascites: AST proved.  CK within normal. -Monitor intermittently  Respiratory failure resolved.  No documented desaturation.  On  room air. -Incentive spirometry/OOB/PT/OT   Depression: Stable. -Continue home medications.   Gout -Continue allopurinol 100 mg daily resumed   Hyperlipidemia -Atorvastatin 40 mg nightly resumed.    OSA on CPAP -Continue nightly CPAP.  Mild thrombocytopenia: Likely due to liver cirrhosis.  Stable. -Monitor.  Urinary incontinence: -Toviaz while in-house.  Mild bilateral intertrigo: -Recommend irrigation and nystatin powder as needed   Morbid obesity Body mass index is 49.72 kg/m.          DVT prophylaxis:  enoxaparin (LOVENOX) injection 40 mg Start: 11/22/22 2200 Place TED hose Start: 11/22/22 1456  Code Status: Full code Family Communication: None at bedside. Level of care: Med-Surg Status is: Inpatient The patient will remain inpatient because: AKI   Final disposition: Home with home health in the next 24 to 48 hours. Consultants:  None  35 minutes with more than 50% spent in reviewing records, counseling patient/family and coordinating care.   Sch Meds:  Scheduled Meds:  allopurinol  100 mg Oral Daily   aspirin  81 mg Oral QHS   atorvastatin  40 mg Oral QHS   enoxaparin (LOVENOX) injection  40 mg Subcutaneous Q24H   ferrous sulfate  325 mg Oral Q breakfast   fesoterodine  4 mg Oral QHS   gabapentin  300 mg Oral Q0600   imipramine  100 mg Oral QHS   lactulose  20 g Oral BID   nystatin   Topical BID   pantoprazole  80 mg Oral Daily   Continuous Infusions:   PRN Meds:.acetaminophen **OR** acetaminophen, albuterol, nystatin, ondansetron **OR** ondansetron (ZOFRAN) IV, senna-docusate  Antimicrobials: Anti-infectives (From admission, onward)    None        I have personally reviewed the following labs and images: CBC: Recent Labs  Lab 11/22/22 1142 11/23/22 0550 11/24/22 0349 11/25/22 0725  WBC 6.1 4.6 4.9 3.8*  HGB 10.5* 10.0* 10.2* 9.9*  HCT 35.7* 33.8* 34.7* 33.3*  MCV 88.1 87.1 87.4 87.2  PLT 129* 133* 126* 127*   BMP  &GFR Recent Labs  Lab 11/22/22 1142 11/23/22 0550 11/24/22 0349 11/25/22 0725  NA 135 136 137 138  K 4.4 4.3 4.2 4.8  CL 103 102 103 104  CO2 19* 25 25 26   GLUCOSE 84 86 97 80  BUN 28* 35* 34* 30*  CREATININE 1.61* 1.74* 1.73* 1.71*  CALCIUM 10.1 9.7 9.8 9.7  MG  --   --  1.9 2.0   CrCl cannot be calculated (Unknown ideal weight.). Liver & Pancreas: Recent Labs  Lab 11/22/22 1518 11/24/22 0349 11/25/22 0725  AST 78* 55* 48*  ALT 31 29 26   ALKPHOS 87 83 83  BILITOT 1.0 0.9 1.1  PROT 7.1 6.8 6.7  ALBUMIN 3.4* 3.3* 3.1*   No results for input(s): "LIPASE", "AMYLASE" in the last 168 hours. Recent Labs  Lab 11/23/22 1010 11/24/22 0349 11/25/22 0725  AMMONIA 91* 66* 39*   Diabetic: No results for input(s): "HGBA1C" in the last 72 hours. No results for input(s): "GLUCAP" in the last 168 hours. Cardiac Enzymes: Recent Labs  Lab 11/23/22 1010 11/24/22 0349 11/25/22 0725  CKTOTAL 234 324* 294*   No results for input(s): "PROBNP" in the last 8760 hours. Coagulation Profile: No results for input(s): "INR", "PROTIME" in the last 168 hours. Thyroid Function Tests: No results for input(s): "TSH", "T4TOTAL", "FREET4", "T3FREE", "THYROIDAB" in the last 72 hours. Lipid Profile: No results for input(s): "CHOL", "HDL", "LDLCALC", "TRIG", "CHOLHDL", "LDLDIRECT" in the last 72 hours. Anemia Panel: No results for input(s): "VITAMINB12", "FOLATE", "FERRITIN", "TIBC", "IRON", "RETICCTPCT" in the last 72 hours. Urine analysis:    Component Value Date/Time   COLORURINE YELLOW (A) 11/22/2022 2240   APPEARANCEUR HAZY (A) 11/22/2022 2240   APPEARANCEUR Clear 04/24/2012 2129   LABSPEC 1.005 11/22/2022 2240   LABSPEC 1.012 04/24/2012 2129   PHURINE 7.0 11/22/2022 2240   GLUCOSEU NEGATIVE 11/22/2022 2240   GLUCOSEU Negative 04/24/2012 2129   HGBUR NEGATIVE 11/22/2022 2240   BILIRUBINUR NEGATIVE 11/22/2022 2240   BILIRUBINUR Negative 04/24/2012 2129   KETONESUR NEGATIVE  11/22/2022 2240   PROTEINUR NEGATIVE 11/22/2022 2240   NITRITE NEGATIVE 11/22/2022 2240   LEUKOCYTESUR TRACE (A) 11/22/2022 2240   LEUKOCYTESUR Negative 04/24/2012 2129   Sepsis Labs: Invalid input(s): "PROCALCITONIN", "LACTICIDVEN"  Microbiology: Recent Results (from the past 240 hour(s))  Resp panel by RT-PCR (RSV, Flu A&B, Covid) Anterior Nasal Swab     Status: None   Collection Time: 11/22/22  6:18 PM   Specimen: Anterior Nasal Swab  Result Value Ref Range Status   SARS Coronavirus 2 by RT PCR NEGATIVE NEGATIVE Final    Comment: (NOTE) SARS-CoV-2 target nucleic acids are NOT DETECTED.  The SARS-CoV-2 RNA is generally detectable in upper respiratory specimens during the acute phase of infection. The lowest concentration of SARS-CoV-2 viral copies this assay can detect is 138 copies/mL. A negative result does not preclude SARS-Cov-2 infection and should not be used as the sole basis for treatment or other patient management decisions. A negative result may occur with  improper specimen collection/handling, submission of specimen other than nasopharyngeal swab, presence of viral mutation(s) within the areas targeted by this assay, and inadequate number of viral copies(<138 copies/mL). A negative result must be combined with clinical observations, patient history, and epidemiological information. The expected result is Negative.  Fact Sheet for Patients:  BloggerCourse.com  Fact Sheet for Healthcare Providers:  SeriousBroker.it  This test is no t yet approved or cleared by the [place] FDA and  has been authorized for detection and/or diagnosis of SARS-CoV-2 by FDA under an Emergency Use Authorization (EUA). This EUA will remain  in effect (meaning this test can be used) for the duration of the COVID-19 declaration under Section 564(b)(1) of the Act, 21 U.S.C.section 360bbb-3(b)(1), unless the authorization is terminated   or revoked sooner.       Influenza A by PCR NEGATIVE NEGATIVE Final   Influenza B by PCR NEGATIVE NEGATIVE Final    Comment: (NOTE) The Xpert Xpress SARS-CoV-2/FLU/RSV plus assay is intended as an aid in the diagnosis of influenza from Nasopharyngeal swab specimens and should not be used as a sole basis for treatment. Nasal washings and aspirates are unacceptable for Xpert Xpress SARS-CoV-2/FLU/RSV testing.  Fact Sheet for Patients: BloggerCourse.com  Fact Sheet for Healthcare Providers: SeriousBroker.it  This test is not yet approved or cleared by the [place] FDA and has been authorized for detection and/or diagnosis of SARS-CoV-2 by FDA under an Emergency Use Authorization (EUA). This EUA will remain in effect (meaning this test can be used) for the duration  of the COVID-19 declaration under Section 564(b)(1) of the Act, 21 U.S.C. section 360bbb-3(b)(1), unless the authorization is terminated or revoked.     Resp Syncytial Virus by PCR NEGATIVE NEGATIVE Final    Comment: (NOTE) Fact Sheet for Patients: BloggerCourse.com  Fact Sheet for Healthcare Providers: SeriousBroker.it  This test is not yet approved or cleared by the [place] FDA and has been authorized for detection and/or diagnosis of SARS-CoV-2 by FDA under an Emergency Use Authorization (EUA). This EUA will remain in effect (meaning this test can be used) for the duration of the COVID-19 declaration under Section 564(b)(1) of the Act, 21 U.S.C. section 360bbb-3(b)(1), unless the authorization is terminated or revoked.  Performed at [facility], [address], [place], [place] [address]     Radiology Studies: No results found.    Milica Gully T. Schuyler Behan [facility]  If 7PM-7AM, please contact night-coverage [email or web address] 11/25/2022, 1:02 PM

## 2022-11-25 NOTE — Consult Note (Signed)
   Heart Failure Nurse Navigator Note  HFpEF 60 to 65%.  Grade 1 diastolic dysfunction.  She presented to the emergency room after she sustained a fall and complaining of weakness.  She also complained of persistent dyspnea for going on a approximately 1 week.  BNP was 15.7  Comorbidities:  Morbid obesity COPD Depression Gout Hypertension Sleep apnea compliant with CPAP Liver cirrhosis  Medications:  Aspirin 81 mg Atorvastatin 40 mg daily Ferrous sulfate 325 mg daily   Labs:  Sodium 138, potassium 4.8, chloride 104, CO2 26, BUN 30, creatinine 1.17 Weight 135.5 kg Intake 628 mL Output 700 mL    Meeting with patient on this admission, she is lying in bed in no acute distress currently on O2 per nasal cannula.  There are no family members present.  I last met with the patient on October 31, 2022.  States that she weighs herself daily at home and had not noted a weight gain.  Discussed fluid restriction and she states that she sucks on crushed ice taking in 3 to 4 cups of the ice daily.  She denies using salt at the table, she states that her husband is also a heart failure patient so they tried to be conscious of low-sodium foods.  Again touched on what to report is such as a 2 pound weight gain overnight or 5 pounds in a week or changes in symptoms such as increasing shortness of breath, PND, orthopnea or abnormal swelling.  She voices understanding.  Made aware that she has a follow-up appointment in the outpatient heart failure clinic on January 26 at 11 AM.  She has no further questions.   Pricilla Riffle RN [facility]

## 2022-11-25 NOTE — Plan of Care (Signed)

## 2022-11-26 DIAGNOSIS — N179 Acute kidney failure, unspecified: Secondary | ICD-10-CM | POA: Diagnosis not present

## 2022-11-26 DIAGNOSIS — D61818 Other pancytopenia: Secondary | ICD-10-CM

## 2022-11-26 DIAGNOSIS — E785 Hyperlipidemia, unspecified: Secondary | ICD-10-CM

## 2022-11-26 DIAGNOSIS — J9611 Chronic respiratory failure with hypoxia: Secondary | ICD-10-CM | POA: Diagnosis not present

## 2022-11-26 DIAGNOSIS — N6092 Unspecified benign mammary dysplasia of left breast: Secondary | ICD-10-CM

## 2022-11-26 DIAGNOSIS — D638 Anemia in other chronic diseases classified elsewhere: Secondary | ICD-10-CM | POA: Diagnosis not present

## 2022-11-26 LAB — CBC
HCT: 33.2 % — ABNORMAL LOW (ref 36.0–46.0)
Hemoglobin: 10.2 g/dL — ABNORMAL LOW (ref 12.0–15.0)
MCH: 26.2 pg (ref 26.0–34.0)
MCHC: 30.7 g/dL (ref 30.0–36.0)
MCV: 85.1 fL (ref 80.0–100.0)
Platelets: 131 10*3/uL — ABNORMAL LOW (ref 150–400)
RBC: 3.9 MIL/uL (ref 3.87–5.11)
RDW: 24.2 % — ABNORMAL HIGH (ref 11.5–15.5)
WBC: 3.3 10*3/uL — ABNORMAL LOW (ref 4.0–10.5)
nRBC: 0 % (ref 0.0–0.2)

## 2022-11-26 LAB — MAGNESIUM: Magnesium: 1.9 mg/dL (ref 1.7–2.4)

## 2022-11-26 LAB — COMPREHENSIVE METABOLIC PANEL
ALT: 26 U/L (ref 0–44)
AST: 49 U/L — ABNORMAL HIGH (ref 15–41)
Albumin: 3.3 g/dL — ABNORMAL LOW (ref 3.5–5.0)
Alkaline Phosphatase: 80 U/L (ref 38–126)
Anion gap: 7 (ref 5–15)
BUN: 24 mg/dL — ABNORMAL HIGH (ref 8–23)
CO2: 23 mmol/L (ref 22–32)
Calcium: 10.1 mg/dL (ref 8.9–10.3)
Chloride: 103 mmol/L (ref 98–111)
Creatinine, Ser: 1.45 mg/dL — ABNORMAL HIGH (ref 0.44–1.00)
GFR, Estimated: 39 mL/min — ABNORMAL LOW (ref >60)
Glucose, Bld: 85 mg/dL (ref 70–99)
Potassium: 4.6 mmol/L (ref 3.5–5.1)
Sodium: 133 mmol/L — ABNORMAL LOW (ref 135–145)
Total Bilirubin: 1 mg/dL (ref 0.3–1.2)
Total Protein: 6.7 g/dL (ref 6.5–8.1)

## 2022-11-26 MED ORDER — LACTULOSE 10 GM/15ML PO SOLN: ORAL | 2 refills | Status: AC

## 2022-11-26 MED ORDER — GABAPENTIN 300 MG PO CAPS: 600.0000 mg | ORAL_CAPSULE | Freq: Every day | ORAL | 0 refills | Status: DC

## 2022-11-26 MED ORDER — FUROSEMIDE 40 MG PO TABS: 40.0000 mg | ORAL_TABLET | Freq: Every day | ORAL | 1 refills | Status: DC

## 2022-11-26 MED ORDER — SPIRONOLACTONE 50 MG PO TABS: 50.0000 mg | ORAL_TABLET | Freq: Every day | ORAL | 0 refills | Status: DC

## 2022-11-26 NOTE — TOC Transition Note (Signed)
Transition of Care [facility]) - CM/SW Discharge Note   Patient Details  Name: Carla Huerta MRN: [id number] Date of Birth: [birth year]  Transition of Care [facility]) CM/SW Contact:  Quin Hoop, LCSW Phone Number: 11/26/2022, 10:41 AM   Clinical Narrative:    Declines snf.  [place] to provide [facility] PT and RN.  rollator ordered via [employer]. family can provide 24/7 assistance including spouse and daughter.   D/C summary complete.   Final next level of care: Home w Home Health Services Barriers to Discharge: No Barriers Identified   Patient Goals and CMS Choice CMS Medicare.gov Compare Post Acute Care list provided to:: Patient Choice offered to / list presented to : Patient  Discharge Placement                         Discharge Plan and Services Additional resources added to the After Visit Summary for                  DME Arranged: [place] rolling DME Agency: [employer] Date DME Agency Contacted: 11/25/22 Time DME Agency Contacted: [id number] Representative spoke with at DME Agency: Suanne Marker HH Arranged: PT, RN [place] Agency: [place] Date [place]: 11/25/22 Time [place]: [id number] Representative spoke with at [place]: [place] Determinants of Health ([place]) Interventions SDOH Screenings   Food Insecurity: No Food Insecurity (11/24/2022)  Housing: Low Risk  (11/24/2022)  Transportation Needs: No Transportation Needs (11/24/2022)  Utilities: Not At Risk (11/24/2022)  Tobacco Use: High Risk (11/24/2022)     Readmission Risk Interventions     No data to display

## 2022-11-26 NOTE — Discharge Summary (Signed)
Physician Discharge Summary  Carla Huerta ZOX:[id number] DOB: Sep 08, 1951 DOA: 11/22/2022  PCP: Hillery AldoPatel, Sarah, MD  Admit date: 11/22/2022 Discharge date: 11/26/2022 Admitted From: Home  Disposition: Home Recommendations for Outpatient Follow-up:  Follow up with PCP in as below Check BP, CMP and CBC at follow-up Please follow up on the following pending results: None  Home Health: PT/RN Equipment/Devices: Rollator  Discharge Condition: Stable CODE STATUS: Full code  Follow-up Information     Hillery AldoPatel, Sarah, MD. Go on 12/02/2022.   Specialty: Family Medicine Why: Appt @ 1:40 pm Contact information: [address] [place] [address] [phone]                 Hospital course 72 year old F with PMH of COPD, OSA on CPAP, liver cirrhosis, morbid obesity, depression, HTN and gout presenting with generalized weakness and shortness of breath for about a week, and admitted with working diagnosis of chronic respiratory failure with hypoxia, shortness of breath and AKI. Cr 1.6 (baseline 1.1).  CXR raised concern for mild interstitial pulmonary edema.  Patient was started on IV Lasix for possible acute CHF.  She also had a fall at home.  CT head and cervical spine without acute finding.   Next day, creatinine trended up to 1.74.  Diuretics discontinued and started on IV fluid.  On the day of discharge, creatinine improved to 1.4.  Patient felt well and ready to go home.  Advised holding diuretics (Lasix and Aldactone) for 4 days.  Also suggested dosage by decreasing Lasix from 80 to 40 mg and increasing Aldactone from 25 to 50 mg daily.  Discontinued lisinopril.  Reassess blood pressure, renal function and electrolytes at follow-up.  therapy recommended SNF but patient chose to go home with home health and DME.  [facility]  See individual problem list below for more.   Problems addressed during this hospitalization Principal Problem:   AKI  (acute kidney injury) (HCC) Active Problems:   Iron deficiency anemia   Atypical ductal hyperplasia of left breast   Sleep apnea   Liver cirrhosis secondary to NASH (nonalcoholic steatohepatitis) (HCC)   Shortness of breath   Obesity, Class III, BMI 40-49.9 (morbid obesity) (HCC)   Hyperlipidemia   Gout   Depression   Fall at home, initial encounter   Anemia of chronic disease   Generalized weakness   AKI on CKD-3A: Suspect prerenal etiology.  Does not appear fluid overloaded.  BNP 15.7.  Recent Labs    05/03/22 1246 05/04/22 0658 05/07/22 0553 10/30/22 1212 10/31/22 0432 11/22/22 1142 11/23/22 0550 11/24/22 0349 11/25/22 0725 11/26/22 0727  BUN 11 12 15 15 15  28* 35* 34* 30* 24*  CREATININE 1.22* 1.11* 1.05* 1.10* 1.08* 1.61* 1.74* 1.73* 1.71* 1.45*  -Recheck renal function in 1 week -Decreased gabapentin to 600 mg at night.   Generalized weakness/fall at home: No obvious injury.  CT head/cervical spine without acute finding. -PT/OT recommends SNF but patient prefers to go home with home health and DME   Anemia of chronic disease: Stable. Recent Labs    05/05/22 0656 05/07/22 0553 10/30/22 1212 10/30/22 2022 10/31/22 0432 11/22/22 1142 11/23/22 0550 11/24/22 0349 11/25/22 0725 11/26/22 0727  HGB 10.9* 10.5* 7.6* 7.7* 7.2* 10.5* 10.0* 10.2* 9.9* 10.2*  -Recheck CBC at follow-up   Hyperammonia: Mental status intact.  Improved from 91>> 39 -Continue lactulose 20 g 1-3 times a day for goal of 1-2 bowel movements a day.   Elevated AST/liver cirrhosis without ascites:  AST proved.  CK within normal. -Recheck liver enzymes at follow-up.   Respiratory failure resolved.  No documented desaturation.  On room air. -Incentive spirometry/OOB/PT/OT   Depression: Stable. -Continue home medications.   Gout -Continue allopurinol 100 mg daily resumed   Hyperlipidemia -Atorvastatin 40 mg nightly resumed.    OSA on CPAP -Continue nightly CPAP.   Pancytopenia:  Likely due to liver cirrhosis.  Stable. -Recheck CBC at follow-up.   Urinary incontinence: -Toviaz while in-house.   Mild bilateral intertrigo: -Recommend irrigation and nystatin powder as needed   Morbid obesity Body mass index is 49.72 kg/m.            Vital signs Vitals:   11/26/22 0042 11/26/22 0438 11/26/22 0724 11/26/22 0730  BP: 129/70 130/67 (!) 130/56 (!) 130/52  Pulse: 88 92 95 95  Temp: 97.9 F (36.6 C) 97.6 F (36.4 C) 98.2 F (36.8 C) 98.2 F (36.8 C)  Resp: 16 16 16 16   Weight:      SpO2: 95% 95% 94%   TempSrc:    Oral     Discharge exam  GENERAL: No apparent distress.  Nontoxic. HEENT: MMM.  Vision and hearing grossly intact.  NECK: Supple.  No apparent JVD.  RESP:  No IWOB.  Fair aeration bilaterally. CVS:  RRR. Heart sounds normal.  ABD/GI/GU: BS+. Abd soft, NTND.  MSK/EXT:  Moves extremities. No apparent deformity. No edema.  SKIN: no apparent skin lesion or wound NEURO: Awake and alert. Oriented appropriately.  No apparent focal neuro deficit. PSYCH: Calm. Normal affect.   Discharge Instructions Discharge Instructions     (HEART FAILURE PATIENTS) Call MD:  Anytime you have any of the following symptoms: 1) 3 pound weight gain in 24 hours or 5 pounds in 1 week 2) shortness of breath, with or without a dry hacking cough 3) swelling in the hands, feet or stomach 4) if you have to sleep on extra pillows at night in order to breathe.   Complete by: As directed    Call MD for:  difficulty breathing, headache or visual disturbances   Complete by: As directed    Call MD for:  extreme fatigue   Complete by: As directed    Call MD for:  persistant dizziness or light-headedness   Complete by: As directed    Diet - low sodium heart healthy   Complete by: As directed    Discharge instructions   Complete by: As directed    It has been a pleasure taking care of you!  You were hospitalized due to generalized weakness and acute kidney injury.  Your  kidney has started to recover.  We recommend holding your fluid medications (Lasix and Aldactone) for the next 4 days unless you notice increased leg swelling or shortness of breath.  We have also made adjustment to your home medications during this hospitalization.  Please review your new medication list and the directions on your medications before you take them.  Follow-up with your primary care doctor in 1 to 2 weeks or sooner if needed.   Take care,   Increase activity slowly   Complete by: As directed       Allergies as of 11/26/2022   No Known Allergies      Medication List     STOP taking these medications    lisinopril 10 MG tablet Commonly known as: ZESTRIL       TAKE these medications    albuterol 108 (90 Base) MCG/ACT inhaler Commonly known  as: VENTOLIN HFA Inhale 2 puffs into the lungs every 4 (four) hours as needed for wheezing or shortness of breath.   alendronate 70 MG tablet Commonly known as: FOSAMAX Take 70 mg by mouth every Wednesday.   allopurinol 100 MG tablet Commonly known as: ZYLOPRIM Take 100 mg by mouth daily.   aspirin 81 MG chewable tablet Chew 81 mg by mouth at bedtime.   atorvastatin 40 MG tablet Commonly known as: LIPITOR Take 40 mg by mouth daily.   cholecalciferol 1000 units tablet Commonly known as: VITAMIN D Take 1,000 Units by mouth daily.   ferrous sulfate 325 (65 FE) MG EC tablet Take 325 mg by mouth daily with breakfast. Notes to patient: Not given in hospital   furosemide 40 MG tablet Commonly known as: Lasix Take 1 tablet (40 mg total) by mouth daily. Start taking on: November 30, 2022 What changed:  how much to take These instructions start on November 30, 2022. If you are unsure what to do until then, ask your doctor or other care provider.   gabapentin 300 MG capsule Commonly known as: NEURONTIN Take 2 capsules (600 mg total) by mouth at bedtime. What changed: how much to take   imipramine 50 MG  tablet Commonly known as: TOFRANIL Take 100 mg by mouth at bedtime.   lactulose 10 GM/15ML solution Commonly known as: CHRONULAC Take 20 g (30 mL) 1-3 times a day for a goal of 1-2 bowel movements a day.   omeprazole 40 MG capsule Commonly known as: PRILOSEC Take 40 mg by mouth daily. Notes to patient: Not given in hospital   spironolactone 50 MG tablet Commonly known as: ALDACTONE Take 1 tablet (50 mg total) by mouth daily. Start taking on: November 30, 2022 What changed:  medication strength how much to take These instructions start on November 30, 2022. If you are unsure what to do until then, ask your doctor or other care provider.   tolterodine 4 MG 24 hr capsule Commonly known as: DETROL LA Take 4 mg by mouth at bedtime.               Durable Medical Equipment  (From admission, onward)           Start     Ordered   11/25/22 1407  For home use only DME 4 wheeled rolling walker with seat  Once       Comments: Bariatric. Patient needs bariatric equipment due to body habitus  Question:  Patient needs a walker to treat with the following condition  Answer:  Generalized weakness   11/25/22 1406            Consultations: none  Procedures/Studies:   DG Chest Port 1 View  Result Date: 11/22/2022 CLINICAL DATA:  Weakness and fall. EXAM: PORTABLE CHEST 1 VIEW COMPARISON:  Chest x-ray dated October 30, 2022. FINDINGS: The patient is rotated to the left. Unchanged cardiomegaly. Increased pulmonary vascular congestion and diffuse interstitial thickening. Low lung volumes with bibasilar atelectasis. No consolidation, pneumothorax, or large pleural effusion. No acute osseous abnormality. IMPRESSION: 1. Mild interstitial pulmonary edema. Electronically Signed   By: Obie Dredge M.D.   On: 11/22/2022 13:16   CT HEAD WO CONTRAST  Result Date: 11/22/2022 CLINICAL DATA:  Fall.  Increased weakness. EXAM: CT HEAD WITHOUT CONTRAST CT CERVICAL SPINE WITHOUT CONTRAST  TECHNIQUE: Multidetector CT imaging of the head and cervical spine was performed following the standard protocol without intravenous contrast. Multiplanar CT image reconstructions of the cervical  spine were also generated. RADIATION DOSE REDUCTION: This exam was performed according to the departmental dose-optimization program which includes automated exposure control, adjustment of the mA and/or kV according to patient size and/or use of iterative reconstruction technique. COMPARISON:  Head CT 05/03/2022. FINDINGS: CT HEAD FINDINGS Brain: No acute intracranial hemorrhage. Gray-white differentiation is preserved. No mass effect or midline shift. Patchy hypoattenuation in the cerebral white matter, most consistent with chronic small vessel disease. Ventricles and sulci are within normal limits for age. No extra-axial collections. Basilar cisterns are patent. Vascular: No hyperdense vessel sign. Skull: No calvarial fracture.  Skull base is unremarkable. Sinuses/Orbits: Paranasal sinuses and mastoid air cells are well aerated. Orbits are unremarkable. Other: No soft tissue abnormalities. CT CERVICAL SPINE FINDINGS Alignment: Normal.  No traumatic listhesis. Skull base and vertebrae: No acute fracture or suspicious bone lesion. Photopenia throughout the cervical spine mildly limits evaluation for subtle nondisplaced fracture. Soft tissues and spinal canal: No prevertebral edema or visible epidural hematoma. Disc levels: No high-grade spinal canal stenosis. Mild upper cervical facet arthropathy. Upper chest: Unremarkable. Other: None. IMPRESSION: 1. No acute intracranial injury. 2. No acute fracture or traumatic listhesis of the cervical spine. Electronically Signed   By: Emmit Alexanders M.D   On: 11/22/2022 11:54   CT Cervical Spine Wo Contrast  Result Date: 11/22/2022 CLINICAL DATA:  Fall.  Increased weakness. EXAM: CT HEAD WITHOUT CONTRAST CT CERVICAL SPINE WITHOUT CONTRAST TECHNIQUE: Multidetector CT imaging of  the head and cervical spine was performed following the standard protocol without intravenous contrast. Multiplanar CT image reconstructions of the cervical spine were also generated. RADIATION DOSE REDUCTION: This exam was performed according to the departmental dose-optimization program which includes automated exposure control, adjustment of the mA and/or kV according to patient size and/or use of iterative reconstruction technique. COMPARISON:  Head CT 05/03/2022. FINDINGS: CT HEAD FINDINGS Brain: No acute intracranial hemorrhage. Gray-white differentiation is preserved. No mass effect or midline shift. Patchy hypoattenuation in the cerebral white matter, most consistent with chronic small vessel disease. Ventricles and sulci are within normal limits for age. No extra-axial collections. Basilar cisterns are patent. Vascular: No hyperdense vessel sign. Skull: No calvarial fracture.  Skull base is unremarkable. Sinuses/Orbits: Paranasal sinuses and mastoid air cells are well aerated. Orbits are unremarkable. Other: No soft tissue abnormalities. CT CERVICAL SPINE FINDINGS Alignment: Normal.  No traumatic listhesis. Skull base and vertebrae: No acute fracture or suspicious bone lesion. Photopenia throughout the cervical spine mildly limits evaluation for subtle nondisplaced fracture. Soft tissues and spinal canal: No prevertebral edema or visible epidural hematoma. Disc levels: No high-grade spinal canal stenosis. Mild upper cervical facet arthropathy. Upper chest: Unremarkable. Other: None. IMPRESSION: 1. No acute intracranial injury. 2. No acute fracture or traumatic listhesis of the cervical spine. Electronically Signed   By: Emmit Alexanders M.D   On: 11/22/2022 11:54   ECHOCARDIOGRAM COMPLETE  Result Date: 10/31/2022    ECHOCARDIOGRAM REPORT   Patient Name:   [name] Date of Exam: 10/31/2022 Medical Rec #:  [id number]       Height:       65.0 in Accession #:    [id number]      Weight:       310.8 lb  Date of Birth:  1951-08-27      BSA:          2.386 m Patient Age:    60 years        BP:  120/62 mmHg Patient Gender: F               HR:           83 bpm. Exam Location:  [facility] Procedure: 2D Echo, Cardiac Doppler and Color Doppler Indications:     CHF I50.9  History:         Patient has no prior history of Echocardiogram examinations.                  COPD; Risk Factors:Hypertension and Sleep Apnea. Renal                  disorder.  Sonographer:     Cristela BlueJerry Hege Referring Phys:  [id number] DAVID MANUEL ORTIZ Diagnosing Phys: Adrian BlackwaterShaukat Khan  Sonographer Comments: Suboptimal apical window. IMPRESSIONS  1. Left ventricular ejection fraction, by estimation, is 60 to 65%. The left ventricle has normal function. The left ventricle has no regional wall motion abnormalities. Left ventricular diastolic parameters are consistent with Grade I diastolic dysfunction (impaired relaxation).  2. Right ventricular systolic function is normal. The right ventricular size is normal.  3. The mitral valve is normal in structure. Trivial mitral valve regurgitation. No evidence of mitral stenosis.  4. The aortic valve is normal in structure. Aortic valve regurgitation is not visualized. Aortic valve sclerosis/calcification is present, without any evidence of aortic stenosis.  5. The inferior vena cava is normal in size with greater than 50% respiratory variability, suggesting right atrial pressure of 3 mmHg. FINDINGS  Left Ventricle: Left ventricular ejection fraction, by estimation, is 60 to 65%. The left ventricle has normal function. The left ventricle has no regional wall motion abnormalities. The left ventricular internal cavity size was normal in size. There is  no left ventricular hypertrophy. Left ventricular diastolic parameters are consistent with Grade I diastolic dysfunction (impaired relaxation). Right Ventricle: The right ventricular size is normal. No increase in right ventricular wall thickness. Right ventricular  systolic function is normal. Left Atrium: Left atrial size was normal in size. Right Atrium: Right atrial size was normal in size. Pericardium: There is no evidence of pericardial effusion. Mitral Valve: The mitral valve is normal in structure. Trivial mitral valve regurgitation. No evidence of mitral valve stenosis. Tricuspid Valve: The tricuspid valve is normal in structure. Tricuspid valve regurgitation is trivial. No evidence of tricuspid stenosis. Aortic Valve: The aortic valve is normal in structure. Aortic valve regurgitation is not visualized. Aortic valve sclerosis/calcification is present, without any evidence of aortic stenosis. Aortic valve mean gradient measures 5.0 mmHg. Aortic valve peak  gradient measures 9.5 mmHg. Aortic valve area, by VTI measures 3.22 cm. Pulmonic Valve: The pulmonic valve was normal in structure. Pulmonic valve regurgitation is not visualized. No evidence of pulmonic stenosis. Aorta: The aortic root is normal in size and structure. Venous: The inferior vena cava is normal in size with greater than 50% respiratory variability, suggesting right atrial pressure of 3 mmHg. IAS/Shunts: No atrial level shunt detected by color flow Doppler.  LEFT VENTRICLE PLAX 2D LVIDd:         5.00 cm   Diastology LVIDs:         3.10 cm   LV e' medial:    6.64 cm/s LV PW:         1.20 cm   LV E/e' medial:  16.9 LV IVS:        1.20 cm   LV e' lateral:   8.92 cm/s LVOT diam:     2.00  cm   LV E/e' lateral: 12.6 LV SV:         87 LV SV Index:   36 LVOT Area:     3.14 cm  RIGHT VENTRICLE RV Basal diam:  2.80 cm RV Mid diam:    2.00 cm RV S prime:     18.80 cm/s TAPSE (M-mode): 2.9 cm LEFT ATRIUM           Index        RIGHT ATRIUM           Index LA diam:      4.10 cm 1.72 cm/m   RA Area:     14.50 cm LA Vol (A2C): 72.1 ml 30.22 ml/m  RA Volume:   33.50 ml  14.04 ml/m LA Vol (A4C): 82.3 ml 34.49 ml/m  AORTIC VALVE AV Area (Vmax):    2.55 cm AV Area (Vmean):   2.60 cm AV Area (VTI):     3.22 cm AV  Vmax:           154.00 cm/s AV Vmean:          101.000 cm/s AV VTI:            0.269 m AV Peak Grad:      9.5 mmHg AV Mean Grad:      5.0 mmHg LVOT Vmax:         125.00 cm/s LVOT Vmean:        83.500 cm/s LVOT VTI:          0.276 m LVOT/AV VTI ratio: 1.03  AORTA Ao Root diam: 3.10 cm MITRAL VALVE                TRICUSPID VALVE MV Area (PHT): 5.42 cm     TR Peak grad:   27.2 mmHg MV Decel Time: 140 msec     TR Vmax:        261.00 cm/s MV E velocity: 112.00 cm/s MV A velocity: 122.00 cm/s  SHUNTS MV E/A ratio:  0.92         Systemic VTI:  0.28 m                             Systemic Diam: 2.00 cm Adrian Blackwater Electronically signed by Adrian Blackwater Signature Date/Time: 10/31/2022/11:58:05 AM    Final    DG Tibia/Fibula Right  Result Date: 10/30/2022 CLINICAL DATA:  Trauma, fall EXAM: RIGHT TIBIA AND FIBULA - 2 VIEW COMPARISON:  None Available. FINDINGS: There is previous right knee arthroplasty. No recent fracture or dislocation is seen. There is soft tissue swelling around the ankle. A soft tissue fullness in suprapatellar bursa. Plantar spur is seen in calcaneus. Bony spurs are noted in the dorsal aspect of intertarsal joints. IMPRESSION: No recent fracture or dislocation is seen. The soft tissue fullness in suprapatellar bursa in right knee suggesting effusion. There is previous right knee arthroplasty. Plantar spur is seen in calcaneus. Bony spurs are noted in the intertarsal joints. Electronically Signed   By: Ernie Avena M.D.   On: 10/30/2022 21:13   DG Chest 2 View  Result Date: 10/30/2022 CLINICAL DATA:  sob EXAM: PORTABLE CHEST 1 VIEW COMPARISON:  05/03/2022 FINDINGS: Cardiac silhouette is prominent. There is pulmonary interstitial prominence with vascular congestion. No focal consolidation. No pneumothorax or pleural effusion identified. Aorta is calcified. There are thoracic degenerative changes. IMPRESSION: Findings suggest CHF. Electronically Signed   By: Layla Maw  M.D.   On:  10/30/2022 13:01       The results of significant diagnostics from this hospitalization (including imaging, microbiology, ancillary and laboratory) are listed below for reference.     Microbiology: Recent Results (from the past 240 hour(s))  Resp panel by RT-PCR (RSV, Flu A&B, Covid) Anterior Nasal Swab     Status: None   Collection Time: 11/22/22  6:18 PM   Specimen: Anterior Nasal Swab  Result Value Ref Range Status   SARS Coronavirus 2 by RT PCR NEGATIVE NEGATIVE Final    Comment: (NOTE) SARS-CoV-2 target nucleic acids are NOT DETECTED.  The SARS-CoV-2 RNA is generally detectable in upper respiratory specimens during the acute phase of infection. The lowest concentration of SARS-CoV-2 viral copies this assay can detect is 138 copies/mL. A negative result does not preclude SARS-Cov-2 infection and should not be used as the sole basis for treatment or other patient management decisions. A negative result may occur with  improper specimen collection/handling, submission of specimen other than nasopharyngeal swab, presence of viral mutation(s) within the areas targeted by this assay, and inadequate number of viral copies(<138 copies/mL). A negative result must be combined with clinical observations, patient history, and epidemiological information. The expected result is Negative.  Fact Sheet for Patients:  BloggerCourse.com  Fact Sheet for Healthcare Providers:  SeriousBroker.it  This test is no t yet approved or cleared by the [place] FDA and  has been authorized for detection and/or diagnosis of SARS-CoV-2 by FDA under an Emergency Use Authorization (EUA). This EUA will remain  in effect (meaning this test can be used) for the duration of the COVID-19 declaration under Section 564(b)(1) of the Act, 21 U.S.C.section 360bbb-3(b)(1), unless the authorization is terminated  or revoked sooner.       Influenza A by PCR  NEGATIVE NEGATIVE Final   Influenza B by PCR NEGATIVE NEGATIVE Final    Comment: (NOTE) The Xpert Xpress SARS-CoV-2/FLU/RSV plus assay is intended as an aid in the diagnosis of influenza from Nasopharyngeal swab specimens and should not be used as a sole basis for treatment. Nasal washings and aspirates are unacceptable for Xpert Xpress SARS-CoV-2/FLU/RSV testing.  Fact Sheet for Patients: BloggerCourse.com  Fact Sheet for Healthcare Providers: SeriousBroker.it  This test is not yet approved or cleared by the [place] FDA and has been authorized for detection and/or diagnosis of SARS-CoV-2 by FDA under an Emergency Use Authorization (EUA). This EUA will remain in effect (meaning this test can be used) for the duration of the COVID-19 declaration under Section 564(b)(1) of the Act, 21 U.S.C. section 360bbb-3(b)(1), unless the authorization is terminated or revoked.     Resp Syncytial Virus by PCR NEGATIVE NEGATIVE Final    Comment: (NOTE) Fact Sheet for Patients: BloggerCourse.com  Fact Sheet for Healthcare Providers: SeriousBroker.it  This test is not yet approved or cleared by the [place] FDA and has been authorized for detection and/or diagnosis of SARS-CoV-2 by FDA under an Emergency Use Authorization (EUA). This EUA will remain in effect (meaning this test can be used) for the duration of the COVID-19 declaration under Section 564(b)(1) of the Act, 21 U.S.C. section 360bbb-3(b)(1), unless the authorization is terminated or revoked.  Performed at [facility] [place], [address] [place], [place] [address]      Labs:  CBC: Recent Labs  Lab 11/22/22 1142 11/23/22 0550 11/24/22 0349 11/25/22 0725 11/26/22 0727  WBC 6.1 4.6 4.9 3.8* 3.3*  HGB 10.5* 10.0* 10.2* 9.9* 10.2*  HCT  35.7* 33.8* 34.7* 33.3* 33.2*  MCV 88.1 87.1 87.4 87.2 85.1  PLT 129*  133* 126* 127* 131*   BMP &GFR Recent Labs  Lab 11/22/22 1142 11/23/22 0550 11/24/22 0349 11/25/22 0725 11/26/22 0727  NA 135 136 137 138 133*  K 4.4 4.3 4.2 4.8 4.6  CL 103 102 103 104 103  CO2 19* 25 25 26 23   GLUCOSE 84 86 97 80 85  BUN 28* 35* 34* 30* 24*  CREATININE 1.61* 1.74* 1.73* 1.71* 1.45*  CALCIUM 10.1 9.7 9.8 9.7 10.1  MG  --   --  1.9 2.0 1.9   CrCl cannot be calculated (Unknown ideal weight.). Liver & Pancreas: Recent Labs  Lab 11/22/22 1518 11/24/22 0349 11/25/22 0725 11/26/22 0727  AST 78* 55* 48* 49*  ALT 31 29 26 26   ALKPHOS 87 83 83 80  BILITOT 1.0 0.9 1.1 1.0  PROT 7.1 6.8 6.7 6.7  ALBUMIN 3.4* 3.3* 3.1* 3.3*   No results for input(s): "LIPASE", "AMYLASE" in the last 168 hours. Recent Labs  Lab 11/23/22 1010 11/24/22 0349 11/25/22 0725  AMMONIA 91* 66* 39*   Diabetic: No results for input(s): "HGBA1C" in the last 72 hours. No results for input(s): "GLUCAP" in the last 168 hours. Cardiac Enzymes: Recent Labs  Lab 11/23/22 1010 11/24/22 0349 11/25/22 0725  CKTOTAL 234 324* 294*   No results for input(s): "PROBNP" in the last 8760 hours. Coagulation Profile: No results for input(s): "INR", "PROTIME" in the last 168 hours. Thyroid Function Tests: No results for input(s): "TSH", "T4TOTAL", "FREET4", "T3FREE", "THYROIDAB" in the last 72 hours. Lipid Profile: No results for input(s): "CHOL", "HDL", "LDLCALC", "TRIG", "CHOLHDL", "LDLDIRECT" in the last 72 hours. Anemia Panel: No results for input(s): "VITAMINB12", "FOLATE", "FERRITIN", "TIBC", "IRON", "RETICCTPCT" in the last 72 hours. Urine analysis:    Component Value Date/Time   COLORURINE YELLOW (A) 11/22/2022 2240   APPEARANCEUR HAZY (A) 11/22/2022 2240   APPEARANCEUR Clear 04/24/2012 2129   LABSPEC 1.005 11/22/2022 2240   LABSPEC 1.012 04/24/2012 2129   PHURINE 7.0 11/22/2022 2240   GLUCOSEU NEGATIVE 11/22/2022 2240   GLUCOSEU Negative 04/24/2012 2129   HGBUR NEGATIVE  11/22/2022 2240   BILIRUBINUR NEGATIVE 11/22/2022 2240   BILIRUBINUR Negative 04/24/2012 2129   KETONESUR NEGATIVE 11/22/2022 2240   PROTEINUR NEGATIVE 11/22/2022 2240   NITRITE NEGATIVE 11/22/2022 2240   LEUKOCYTESUR TRACE (A) 11/22/2022 2240   LEUKOCYTESUR Negative 04/24/2012 2129   Sepsis Labs: Invalid input(s): "PROCALCITONIN", "LACTICIDVEN"   SIGNED:  Almon Herculesaye T Tonnie Friedel, MD  [facility] 11/26/2022, 2:57 PM

## 2022-11-26 NOTE — Progress Notes (Signed)
Discharge instructions reviewed with patient. She verbalized understanding of discharge instructions. She will be transported home via family car.

## 2022-12-01 ENCOUNTER — Emergency Department: Payer: Medicare HMO

## 2022-12-01 ENCOUNTER — Other Ambulatory Visit: Payer: Self-pay

## 2022-12-01 ENCOUNTER — Emergency Department
Admission: EM | Admit: 2022-12-01 | Discharge: 2022-12-03 | Disposition: A | Discharge status: Home / Self Care | Payer: Medicare HMO | Source: Home / Self Care | Attending: Emergency Medicine | Admitting: Emergency Medicine

## 2022-12-01 DIAGNOSIS — I509 Heart failure, unspecified: Secondary | ICD-10-CM | POA: Insufficient documentation

## 2022-12-01 DIAGNOSIS — W19XXXA Unspecified fall, initial encounter: Secondary | ICD-10-CM | POA: Insufficient documentation

## 2022-12-01 DIAGNOSIS — Z20822 Contact with and (suspected) exposure to covid-19: Secondary | ICD-10-CM | POA: Insufficient documentation

## 2022-12-01 DIAGNOSIS — R4182 Altered mental status, unspecified: Secondary | ICD-10-CM

## 2022-12-01 DIAGNOSIS — R531 Weakness: Secondary | ICD-10-CM | POA: Insufficient documentation

## 2022-12-01 DIAGNOSIS — S0990XA Unspecified injury of head, initial encounter: Secondary | ICD-10-CM

## 2022-12-01 DIAGNOSIS — R4781 Slurred speech: Secondary | ICD-10-CM | POA: Insufficient documentation

## 2022-12-01 DIAGNOSIS — J449 Chronic obstructive pulmonary disease, unspecified: Secondary | ICD-10-CM | POA: Insufficient documentation

## 2022-12-01 DIAGNOSIS — K7682 Hepatic encephalopathy: Secondary | ICD-10-CM | POA: Diagnosis not present

## 2022-12-01 LAB — CBC
HCT: 41.8 % (ref 36.0–46.0)
Hemoglobin: 12.4 g/dL (ref 12.0–15.0)
MCH: 26.4 pg (ref 26.0–34.0)
MCHC: 29.7 g/dL — ABNORMAL LOW (ref 30.0–36.0)
MCV: 88.9 fL (ref 80.0–100.0)
Platelets: 145 10*3/uL — ABNORMAL LOW (ref 150–400)
RBC: 4.7 MIL/uL (ref 3.87–5.11)
RDW: 24 % — ABNORMAL HIGH (ref 11.5–15.5)
WBC: 4.9 10*3/uL (ref 4.0–10.5)
nRBC: 0 % (ref 0.0–0.2)

## 2022-12-01 LAB — COMPREHENSIVE METABOLIC PANEL
ALT: 41 U/L (ref 0–44)
AST: 76 U/L — ABNORMAL HIGH (ref 15–41)
Albumin: 4 g/dL (ref 3.5–5.0)
Alkaline Phosphatase: 91 U/L (ref 38–126)
Anion gap: 12 (ref 5–15)
BUN: 33 mg/dL — ABNORMAL HIGH (ref 8–23)
CO2: 25 mmol/L (ref 22–32)
Calcium: 10.8 mg/dL — ABNORMAL HIGH (ref 8.9–10.3)
Chloride: 103 mmol/L (ref 98–111)
Creatinine, Ser: 1.63 mg/dL — ABNORMAL HIGH (ref 0.44–1.00)
GFR, Estimated: 34 mL/min — ABNORMAL LOW (ref >60)
Glucose, Bld: 103 mg/dL — ABNORMAL HIGH (ref 70–99)
Potassium: 4.6 mmol/L (ref 3.5–5.1)
Sodium: 140 mmol/L (ref 135–145)
Total Bilirubin: 1 mg/dL (ref 0.3–1.2)
Total Protein: 8.1 g/dL (ref 6.5–8.1)

## 2022-12-01 LAB — RESP PANEL BY RT-PCR (RSV, FLU A&B, COVID)  RVPGX2
Influenza A by PCR: NEGATIVE
Influenza B by PCR: NEGATIVE
Resp Syncytial Virus by PCR: NEGATIVE
SARS Coronavirus 2 by RT PCR: NEGATIVE

## 2022-12-01 LAB — URINALYSIS, ROUTINE W REFLEX MICROSCOPIC
Bilirubin Urine: NEGATIVE
Glucose, UA: NEGATIVE mg/dL
Hgb urine dipstick: NEGATIVE
Ketones, ur: NEGATIVE mg/dL
Leukocytes,Ua: NEGATIVE
Nitrite: NEGATIVE
Protein, ur: NEGATIVE mg/dL
Specific Gravity, Urine: 1.012 (ref 1.005–1.030)
pH: 6 (ref 5.0–8.0)

## 2022-12-01 LAB — AMMONIA: Ammonia: 49 umol/L — ABNORMAL HIGH (ref 9–35)

## 2022-12-01 LAB — BRAIN NATRIURETIC PEPTIDE: B Natriuretic Peptide: 39.6 pg/mL (ref 0.0–100.0)

## 2022-12-01 LAB — LACTIC ACID, PLASMA: Lactic Acid, Venous: 2 mmol/L (ref 0.5–1.9)

## 2022-12-01 LAB — BLOOD GAS, VENOUS
Acid-Base Excess: 2.5 mmol/L — ABNORMAL HIGH (ref 0.0–2.0)
Bicarbonate: 28.4 mmol/L — ABNORMAL HIGH (ref 20.0–28.0)
O2 Saturation: 31.2 %
Patient temperature: 37
pCO2, Ven: 48 mmHg (ref 44–60)
pH, Ven: 7.38 (ref 7.25–7.43)
pO2, Ven: <31 mmHg — CL (ref 32–45)

## 2022-12-01 LAB — TROPONIN I (HIGH SENSITIVITY)
Troponin I (High Sensitivity): 6 ng/L (ref <18)
Troponin I (High Sensitivity): 5 ng/L (ref <18)

## 2022-12-01 MED ORDER — IMIPRAMINE HCL 50 MG PO TABS
100.0000 mg | ORAL_TABLET | Freq: Every day | ORAL | Status: DC
  Administered 2022-12-01 – 2022-12-02 (×2): 100 mg via ORAL
  Filled 2022-12-01 (×2): qty 2

## 2022-12-01 MED ORDER — ASPIRIN 81 MG PO CHEW
81.0000 mg | CHEWABLE_TABLET | Freq: Every day | ORAL | Status: DC
  Administered 2022-12-01 – 2022-12-02 (×2): 81 mg via ORAL
  Filled 2022-12-01 (×2): qty 1

## 2022-12-01 MED ORDER — VITAMIN D 25 MCG (1000 UNIT) PO TABS
1000.0000 [IU] | ORAL_TABLET | Freq: Every day | ORAL | Status: DC
  Administered 2022-12-01 – 2022-12-03 (×3): 1000 [IU] via ORAL
  Filled 2022-12-01 (×3): qty 1

## 2022-12-01 MED ORDER — GABAPENTIN 300 MG PO CAPS
600.0000 mg | ORAL_CAPSULE | Freq: Every day | ORAL | Status: DC
  Administered 2022-12-01 – 2022-12-02 (×2): 600 mg via ORAL
  Filled 2022-12-01 (×2): qty 2

## 2022-12-01 MED ORDER — FUROSEMIDE 40 MG PO TABS: 40.0000 mg | ORAL_TABLET | Freq: Every day | ORAL | Status: DC

## 2022-12-01 MED ORDER — ALBUTEROL SULFATE (2.5 MG/3ML) 0.083% IN NEBU: 2.5000 mg | INHALATION_SOLUTION | RESPIRATORY_TRACT | Status: DC | PRN

## 2022-12-01 MED ORDER — ALBUTEROL SULFATE HFA 108 (90 BASE) MCG/ACT IN AERS: 2.0000 | INHALATION_SPRAY | RESPIRATORY_TRACT | Status: DC | PRN

## 2022-12-01 MED ORDER — PANTOPRAZOLE SODIUM 40 MG PO TBEC
40.0000 mg | DELAYED_RELEASE_TABLET | Freq: Every day | ORAL | Status: DC
  Administered 2022-12-02 – 2022-12-03 (×2): 40 mg via ORAL
  Filled 2022-12-01 (×2): qty 1

## 2022-12-01 MED ORDER — LACTATED RINGERS IV BOLUS
500.0000 mL | Freq: Once | INTRAVENOUS | Status: AC
  Administered 2022-12-01: 500 mL via INTRAVENOUS

## 2022-12-01 MED ORDER — FUROSEMIDE 40 MG PO TABS
40.0000 mg | ORAL_TABLET | Freq: Every day | ORAL | Status: DC
  Administered 2022-12-02 – 2022-12-03 (×2): 40 mg via ORAL
  Filled 2022-12-01 (×2): qty 1

## 2022-12-01 MED ORDER — SPIRONOLACTONE 25 MG PO TABS
50.0000 mg | ORAL_TABLET | Freq: Every day | ORAL | Status: DC
  Administered 2022-12-02 – 2022-12-03 (×2): 50 mg via ORAL
  Filled 2022-12-01 (×2): qty 2

## 2022-12-01 MED ORDER — PANTOPRAZOLE SODIUM 40 MG PO TBEC: 40.0000 mg | DELAYED_RELEASE_TABLET | Freq: Every day | ORAL | Status: DC

## 2022-12-01 MED ORDER — ATORVASTATIN CALCIUM 20 MG PO TABS
40.0000 mg | ORAL_TABLET | Freq: Every day | ORAL | Status: DC
  Administered 2022-12-01 – 2022-12-03 (×3): 40 mg via ORAL
  Filled 2022-12-01 (×3): qty 2

## 2022-12-01 MED ORDER — LACTULOSE 10 GM/15ML PO SOLN
20.0000 g | Freq: Three times a day (TID) | ORAL | Status: DC
  Administered 2022-12-01 – 2022-12-03 (×5): 20 g via ORAL
  Filled 2022-12-01 (×5): qty 30

## 2022-12-01 MED ORDER — FESOTERODINE FUMARATE ER 4 MG PO TB24
4.0000 mg | ORAL_TABLET | Freq: Every day | ORAL | Status: DC
  Administered 2022-12-01 – 2022-12-02 (×2): 4 mg via ORAL
  Filled 2022-12-01 (×2): qty 1

## 2022-12-01 MED ORDER — SPIRONOLACTONE 25 MG PO TABS: 50.0000 mg | ORAL_TABLET | Freq: Every day | ORAL | Status: DC

## 2022-12-01 MED ORDER — FERROUS SULFATE 325 (65 FE) MG PO TABS
325.0000 mg | ORAL_TABLET | Freq: Every day | ORAL | Status: DC
  Administered 2022-12-02 – 2022-12-03 (×2): 325 mg via ORAL
  Filled 2022-12-01 (×2): qty 1

## 2022-12-01 MED ORDER — ALENDRONATE SODIUM 70 MG PO TABS: 70.0000 mg | ORAL_TABLET | ORAL | Status: DC

## 2022-12-01 MED ORDER — ALLOPURINOL 100 MG PO TABS
100.0000 mg | ORAL_TABLET | Freq: Every day | ORAL | Status: DC
  Administered 2022-12-02 – 2022-12-03 (×2): 100 mg via ORAL
  Filled 2022-12-01 (×2): qty 1

## 2022-12-01 NOTE — ED Triage Notes (Signed)
Pt brought in via ems due to multiple falls at home/weakness/slurred speech. Pt was diagnosed with uti on 1/16. Pt speech is clear on arrival. Pt isA&Ox4. Pt is covered in urine on arrival.

## 2022-12-01 NOTE — ED Provider Notes (Signed)
[facility] Provider Note    Event Date/Time   First MD Initiated Contact with Patient 12/01/22 1056     (approximate)   History   Fall   HPI  Carla Huerta is a 72 y.o. female past medical history significant for COPD, OSA on CPAP, liver cirrhosis, CHF, who presents to the emergency department following a fall and altered mental status.  Patient had a recent hospitalization and was discharged in the past week.  States that since being home she has got progressively more weak.  Had a fall yesterday and today that were witnessed by family members.  States that she lives with her husband, daughter and grandchildren.  Another fall this morning.  Family members were concerned that she was having more weakness, slurred speech and more confusion.  Covered in urine.  Denies any head injury, neck pain, back pain or pain in her extremities.  No chest pain or shortness of breath.     Physical Exam   Triage Vital Signs: ED Triage Vitals  Enc Vitals Group     BP 12/01/22 1056 139/84     Pulse Rate 12/01/22 1056 92     Resp 12/01/22 1101 12     Temp 12/01/22 1056 (!) 97 F (36.1 C)     Temp Source 12/01/22 1056 Axillary     SpO2 12/01/22 1056 96 %     Weight --      Height --      Head Circumference --      [facility] Flow --      Pain Score --      Pain Loc --      Pain Edu? --      Excl. in [place]? --     Most recent vital signs: Vitals:   12/01/22 1056 12/01/22 1101  BP: 139/84   Pulse: 92   Resp:  12  Temp: (!) 97 F (36.1 C)   SpO2: 96%     Physical Exam Constitutional:      Appearance: She is well-developed. She is obese.     Comments: Covered in urine  HENT:     Head: Atraumatic.  Eyes:     Conjunctiva/sclera: Conjunctivae normal.  Cardiovascular:     Rate and Rhythm: Regular rhythm.  Pulmonary:     Effort: No respiratory distress.  Abdominal:     General: There is no distension.  Musculoskeletal:        General: Normal range of motion.      Cervical back: Normal range of motion. No tenderness.     Comments: No tenderness to bilateral hips  Skin:    General: Skin is warm.  Neurological:     Mental Status: She is alert. Mental status is at baseline.     Comments: Moving all extremities.  Following commands.  Slurred speech.  Cranial nerves intact  Psychiatric:        Mood and Affect: Mood normal.     IMPRESSION / MDM / ASSESSMENT AND PLAN / ED COURSE  I reviewed the triage vital signs and the nursing notes.  72 year old female presents to the emergency department with multiple falls and altered mental status.  Patient arrived via EMS.  On arrival temperature 97.  Normotensive and 96% on room air.  On chart review of the patient's last hospitalization she was admitted for generalized weakness and frequent falls at home.  Patient had diuresis for mild acute kidney injury and ultimately was recommended that  she be discharged to a SNF however patient ultimately decided to go home with home health and DME.  Patient during that hospitalization had an elevated ammonia level that improved with lactulose, 39 at time of discharge.   Differential diagnosis including stroke, intracranial hemorrhage, viral illness including COVID/influenza, urinary tract infection, dehydration, polypharmacy   EKG  I, Nathaniel Man, the attending physician, personally viewed and interpreted this ECG.   Rate: Normal  Rhythm: Normal sinus  Axis: Normal  Intervals: Normal  ST&T Change: None  No tachycardic or bradycardic dysrhythmias while on cardiac telemetry.  RADIOLOGY I independently reviewed imaging, my interpretation of imaging: CT scan of the head, chest x-ray  CT scan of the head with no signs of intracranial hemorrhage or infarction.  Chest x-ray with no focal findings consistent with pneumonia.  No signs of pulmonary edema.  LABS (all labs ordered are listed, but only abnormal results are displayed) Labs interpreted as -  COVID and  influenza testing are negative.  No significant leukocytosis or anemia.  Chronic thrombocytopenia.  Mildly elevated lactic acid.  BMP appears to be at her baseline.  Ammonia level 49 which is mildly increased from 39 at time of discharge.  Labs Reviewed  CBC - Abnormal; Notable for the following components:      Result Value   MCHC 29.7 (*)    RDW 24.0 (*)    Platelets 145 (*)    All other components within normal limits  COMPREHENSIVE METABOLIC PANEL - Abnormal; Notable for the following components:   Glucose, Bld 103 (*)    BUN 33 (*)    Creatinine, Ser 1.63 (*)    Calcium 10.8 (*)    AST 76 (*)    GFR, Estimated 34 (*)    All other components within normal limits  BLOOD GAS, VENOUS - Abnormal; Notable for the following components:   pO2, Ven <31 (*)    Bicarbonate 28.4 (*)    Acid-Base Excess 2.5 (*)    All other components within normal limits  LACTIC ACID, PLASMA - Abnormal; Notable for the following components:   Lactic Acid, Venous 2.0 (*)    All other components within normal limits  URINALYSIS, ROUTINE W REFLEX MICROSCOPIC - Abnormal; Notable for the following components:   Color, Urine YELLOW (*)    APPearance CLEAR (*)    All other components within normal limits  AMMONIA - Abnormal; Notable for the following components:   Ammonia 49 (*)    All other components within normal limits  RESP PANEL BY RT-PCR (RSV, FLU A&B, COVID)  RVPGX2  BRAIN NATRIURETIC PEPTIDE  LACTIC ACID, PLASMA  TROPONIN I (HIGH SENSITIVITY)  TROPONIN I (HIGH SENSITIVITY)    TREATMENT   500 cc bolus  UA with no signs of urinary tract infection.  After further conversation with the patient, daughter and husband does not feel that the patient is able to be discharged home.  Both family members states that they are unable to care for her and that they now understand that she needs to go to a rehab facility.  Requesting that she stay in the emergency department since she does not meet criteria  for hospitalization until they are able to get placement into a rehab facility.  States that she has been evaluated at [facility] regional rehab in the past and believe that they can get her in tomorrow.  Med rec order placed.  Consulted social work.   PROCEDURES:  Critical Care performed: No  Procedures  Patient's presentation is most consistent with acute presentation with potential threat to life or bodily function.   MEDICATIONS ORDERED IN ED: Medications  lactated ringers bolus 500 mL (500 mLs Intravenous New Bag/Given 12/01/22 1130)    FINAL CLINICAL IMPRESSION(S) / ED DIAGNOSES   Final diagnoses:  Fall, initial encounter  Injury of head, initial encounter  Altered mental status, unspecified altered mental status type     Rx / DC Orders   ED Discharge Orders     None        Note:  This document was prepared using Dragon voice recognition software and may include unintentional dictation errors.   Corena Herter, MD 12/01/22 1515

## 2022-12-02 NOTE — ED Provider Notes (Signed)
-----------------------------------------   8:24 AM on 12/02/2022 -----------------------------------------   Blood pressure 137/79, pulse 82, temperature (!) 97 F (36.1 C), temperature source Axillary, resp. rate 12, SpO2 98 %.  The patient is calm and cooperative at this time.  There have been no acute events since the last update.  Awaiting disposition plan from case management/social work.    Nathaniel Man, MD 12/02/22 [phone]

## 2022-12-02 NOTE — ED Notes (Signed)
Daughter, Kenney Houseman, wants to be called when pt is moved so she can meet them at the facility.

## 2022-12-02 NOTE — ED Notes (Signed)
Patient incontinent of urine at this time.  Patient's linen changed.  New adult brief and gown provided.  External catheter applied.  Patient moved to hospital bed for comfort.

## 2022-12-02 NOTE — TOC Initial Note (Signed)
Transition of Care [facility]) - Initial/Assessment Note    Patient Details  Name: Carla Huerta MRN: [id number] Date of Birth: 1951-07-29  [facility]) CM/SW Contact:    Shelbie Hutching, RN Phone Number: 12/02/2022, 10:36 AM  Clinical Narrative:                 Patient came into the emergency room after having several falls at home, patient reports falling and not being able to get up.   RNCM met with patient at the bedside, introduced self and explained role in DC planning.  Patient agrees to go for short term rehab, she has been to [facility] in the past and would like to go there again if she can.  Called and spoke with patient's husband at her request, he says he agrees with [facility], and if they don't have a bed then [facility] would be the second choice.   PT has been ordered.  RNCM will start SNF workup.   Expected Discharge Plan: Skilled Nursing Facility Barriers to Discharge: SNF Pending bed offer   Patient Goals and CMS Choice Patient states their goals for this hospitalization and ongoing recovery are:: agrees to go for short term rehab CMS Medicare.gov Compare Post Acute Care list provided to:: Patient Choice offered to / list presented to : Patient, Spouse      Expected Discharge Plan and Services   Discharge Planning Services: CM Consult Post Acute Care Choice: [place] arrangements for the past 2 months: Single Family Home                 DME Arranged: N/A DME Agency: NA       HH Arranged: NA HH Agency: NA        Prior Living Arrangements/Services Living arrangements for the past 2 months: Single Family Home Lives with:: Adult Children, Spouse Patient language and need for interpreter reviewed:: Yes Do you feel safe going back to the place where you live?: Yes      Need for Family Participation in Patient Care: Yes (Comment) Care giver support system in place?: Yes (comment) Current home  services: DME (walker, rollator) Criminal Activity/Legal Involvement Pertinent to Current Situation/Hospitalization: No - Comment as needed  Activities of Daily Living      Permission Sought/Granted Permission sought to share information with : Case Manager, Customer service manager, Family Supports Permission granted to share information with : Yes, Verbal Permission Granted  Share Information with NAME: Sydna Brodowski  Permission granted to share info w AGENCY: SNF's  Permission granted to share info w Relationship: spouse  Permission granted to share info w Contact Information: [phone]  Emotional Assessment Appearance:: Appears older than stated age Attitude/Demeanor/Rapport: Engaged Affect (typically observed): Accepting Orientation: : Oriented to Self, Oriented to Place, Oriented to  Time, Oriented to Situation Alcohol / Substance Use: Not Applicable Psych Involvement: No (comment)  Admission diagnosis:  uti Patient Active Problem List   Diagnosis Date Noted   Fall at home, initial encounter 11/23/2022   Anemia of chronic disease 11/23/2022   Generalized weakness 11/23/2022   Shortness of breath 11/22/2022   Obesity, Class III, BMI 40-49.9 (morbid obesity) ([place]) 11/22/2022   AKI (acute kidney injury) ([place]) 11/22/2022   Hyperlipidemia 11/22/2022   Gout 11/22/2022   Depression 11/22/2022   Sleep apnea 10/31/2022   Liver cirrhosis secondary to NASH (nonalcoholic steatohepatitis) ([place]) 10/31/2022   Acute CHF (congestive heart failure) ([place]) 10/30/2022   Acute hepatic  encephalopathy ([place]) 05/04/2022   Sepsis ([place]) 05/03/2022   Acute pyelonephritis 05/03/2022   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/12/2019   Iron deficiency anemia 10/14/2016   Blood loss anemia    Hernia, hiatal    Pancytopenia ([place]) 07/25/2016   Gallstone    PCP:  Denton Lank, MD Pharmacy:   [facility] [phone] [address], [place] AT [facility] [address] [place] [address] Phone: [phone] Fax: [phone]     Social Determinants of Health (SDOH) Social History: SDOH Screenings   Food Insecurity: No Food Insecurity (11/24/2022)  Housing: Low Risk  (11/24/2022)  Transportation Needs: No Transportation Needs (11/24/2022)  Utilities: Not At Risk (11/24/2022)  Tobacco Use: High Risk (12/01/2022)   SDOH Interventions:     Readmission Risk Interventions     No data to display

## 2022-12-02 NOTE — Evaluation (Signed)
Physical Therapy Evaluation Patient Details Name: Carla Huerta MRN: [id number] DOB: August 24, 1951 Today's Date: 12/02/2022  History of Present Illness  Pt admitted for multiple falls after recent discharge from hospital. History includes COPD, liver cirrhosis, and CHF.  Clinical Impression  Pt is a pleasant 72 year old female who was admitted for multiple falls. Pt performs bed mobility with min assist and unable to further perform transfers/ambulation. Pt demonstrates deficits with strength/mobility/balance. Pt with multiple falls and isn't safe to dc home. Would benefit from skilled PT to address above deficits and promote optimal return to PLOF; recommend transition to STR upon discharge from acute hospitalization.      Recommendations for follow up therapy are one component of a multi-disciplinary discharge planning process, led by the attending physician.  Recommendations may be updated based on patient status, additional functional criteria and insurance authorization.  Follow Up Recommendations Skilled nursing-short term rehab (<3 hours/day) Can patient physically be transported by private vehicle: No    Assistance Recommended at Discharge Frequent or constant Supervision/Assistance  Patient can return home with the following  A lot of help with walking and/or transfers;A lot of help with bathing/dressing/bathroom;Help with stairs or ramp for entrance    Equipment Recommendations  (TBD)  Recommendations for Other Services       Functional Status Assessment Patient has had a recent decline in their functional status and demonstrates the ability to make significant improvements in function in a reasonable and predictable amount of time.     Precautions / Restrictions Precautions Precautions: Fall Restrictions Weight Bearing Restrictions: No      Mobility  Bed Mobility Overal bed mobility: Needs Assistance Bed Mobility: Supine to Sit, Sit to Supine     Supine to sit:  Min assist Sit to supine: Min assist   General bed mobility comments: needs assist for trunkal elevation.    Transfers                   General transfer comment: unable to transfer despite max assist secondary to feet unable to touch floor and safety concerns    Ambulation/Gait                  Stairs            Wheelchair Mobility    Modified Rankin (Stroke Patients Only)       Balance Overall balance assessment: Needs assistance Sitting-balance support: Bilateral upper extremity supported, Feet supported Sitting balance-Leahy Scale: Good                                       Pertinent Vitals/Pain Pain Assessment Pain Assessment: No/denies pain    Home Living Family/patient expects to be discharged to:: Private residence Living Arrangements: Spouse/significant other Available Help at Discharge: Family;Available 24 hours/day (spouse is disabled and uses eWC most of the time, daughter available 24/7) Type of Home: House Home Access: Ramped entrance       Home Layout: One level Home Equipment: Grant (4 wheels);Tub bench      Prior Function Prior Level of Function : History of Falls (last six months);Needs assist             Mobility Comments: needs assist for all mobility and has been having recent falls ADLs Comments: Mod I with ADL's and simple meals. Spouse and daughter to assist with medication management, IADL's, and transportation. Pt states she  can drive sometimes.     Hand Dominance        Extremity/Trunk Assessment   Upper Extremity Assessment Upper Extremity Assessment: Generalized weakness (B UE grossly 3+/5)    Lower Extremity Assessment Lower Extremity Assessment: Generalized weakness (B LE grossly 3/5)       Communication   Communication: No difficulties  Cognition Arousal/Alertness: Awake/alert Behavior During Therapy: WFL for tasks assessed/performed Overall Cognitive Status: Within  Functional Limits for tasks assessed                                 General Comments: alert and oriented and agreeable to session        General Comments      Exercises     Assessment/Plan    PT Assessment Patient needs continued PT services  PT Problem List Decreased strength;Decreased activity tolerance;Decreased balance;Decreased mobility;Decreased safety awareness;Decreased skin integrity;Obesity;Cardiopulmonary status limiting activity       PT Treatment Interventions DME instruction;Gait training;Functional mobility training;Therapeutic activities;Therapeutic exercise;Balance training;Neuromuscular re-education    PT Goals (Current goals can be found in the Care Plan section)  Acute Rehab PT Goals Patient Stated Goal: "walking" PT Goal Formulation: With patient Time For Goal Achievement: 12/16/22 Potential to Achieve Goals: Good    Frequency Min 2X/week     Co-evaluation               AM-PAC PT "6 Clicks" Mobility  Outcome Measure Help needed turning from your back to your side while in a flat bed without using bedrails?: A Little Help needed moving from lying on your back to sitting on the side of a flat bed without using bedrails?: A Little Help needed moving to and from a bed to a chair (including a wheelchair)?: A Lot Help needed standing up from a chair using your arms (e.g., wheelchair or bedside chair)?: Total Help needed to walk in hospital room?: Total Help needed climbing 3-5 steps with a railing? : Total 6 Click Score: 11    End of Session   Activity Tolerance: Patient limited by fatigue Patient left: in bed Nurse Communication: Mobility status PT Visit Diagnosis: Unsteadiness on feet (R26.81);History of falling (Z91.81);Muscle weakness (generalized) (M62.81);Difficulty in walking, not elsewhere classified (R26.2)    Time: 8882-8003 PT Time Calculation (min) (ACUTE ONLY): 8 min   Charges:   PT Evaluation $PT Eval Low  Complexity: 1 Low          Greggory Stallion, PT, DPT, GCS [phone]   Dayonna Selbe 12/02/2022, 1:37 PM

## 2022-12-02 NOTE — ED Notes (Signed)
Pt soiled with urine. Pt cleaned and bed linens changed by this RN and Lanelle Bal, RN

## 2022-12-02 NOTE — TOC Progression Note (Signed)
Transition of Care [facility]) - Progression Note    Patient Details  Name: Carla Huerta MRN: [id number] Date of Birth: 1951/05/06  Transition of Care [facility]) CM/SW Contact  Shelbie Hutching, RN Phone Number: 12/02/2022, 4:28 PM  Clinical Narrative:    [facility] came back and is able to offer a bed but after speaking with the husband he would like to stay with [facility].  He reports that [facility] is within walking discharge to their home.   Plan for DC tomorrow to [facility].     Expected Discharge Plan: Skilled Nursing Facility Barriers to Discharge: SNF Pending bed offer  Expected Discharge Plan and Services   Discharge Planning Services: CM Consult Post Acute Care Choice: [place] arrangements for the past 2 months: Single Family Home                 DME Arranged: N/A DME Agency: NA       HH Arranged: NA HH Agency: NA         Social Determinants of Health (SDOH) Interventions SDOH Screenings   Food Insecurity: No Food Insecurity (11/24/2022)  Housing: Low Risk  (11/24/2022)  Transportation Needs: No Transportation Needs (11/24/2022)  Utilities: Not At Risk (11/24/2022)  Tobacco Use: High Risk (12/01/2022)    Readmission Risk Interventions     No data to display

## 2022-12-02 NOTE — TOC Progression Note (Signed)
Transition of Care [facility]) - Progression Note    Patient Details  Name: TANETTA FUHRIMAN MRN: [id number] Date of Birth: 1951-08-30  Transition of Care [facility]) CM/SW Contact  Shelbie Hutching, RN Phone Number: 12/02/2022, 2:34 PM  Clinical Narrative:    [facility] cannot accept the patient from the emergency department.  [facility] will offer a bed.  Bed offer accepted.  TOC starting insurance authorization plan for discharge tomorrow, [facility] cannot accept till tomorrow.    Expected Discharge Plan: Skilled Nursing Facility Barriers to Discharge: SNF Pending bed offer  Expected Discharge Plan and Services   Discharge Planning Services: CM Consult Post Acute Care Choice: [place] arrangements for the past 2 months: Single Family Home                 DME Arranged: N/A DME Agency: NA       HH Arranged: NA HH Agency: NA         Social Determinants of Health (SDOH) Interventions SDOH Screenings   Food Insecurity: No Food Insecurity (11/24/2022)  Housing: Low Risk  (11/24/2022)  Transportation Needs: No Transportation Needs (11/24/2022)  Utilities: Not At Risk (11/24/2022)  Tobacco Use: High Risk (12/01/2022)    Readmission Risk Interventions     No data to display

## 2022-12-02 NOTE — NC FL2 (Signed)
[place] LEVEL OF CARE FORM     IDENTIFICATION  Patient Name: Carla Huerta Birthdate: [birth year] Sex: female Admission Date (Current Location): 12/01/2022  [facility] and [place] Number:  Engineering geologist and Address:  [facility], [address], [place], [place] [address]      Provider Number: [id number]  Attending Physician Name and Address:  No att. providers found  Relative Name and Phone Number:  Maddilyn Campus- spouse- [phone]    Current Level of Care: Hospital Recommended Level of Care: [place] Prior Approval Number:    Date Approved/Denied:   PASRR Number: [id number]  Discharge Plan: SNF    Current Diagnoses: Patient Active Problem List   Diagnosis Date Noted   Fall at home, initial encounter 11/23/2022   Anemia of chronic disease 11/23/2022   Generalized weakness 11/23/2022   Shortness of breath 11/22/2022   Obesity, Class III, BMI 40-49.9 (morbid obesity) ([place]) 11/22/2022   AKI (acute kidney injury) ([place]) 11/22/2022   Hyperlipidemia 11/22/2022   Gout 11/22/2022   Depression 11/22/2022   Sleep apnea 10/31/2022   Liver cirrhosis secondary to NASH (nonalcoholic steatohepatitis) ([place]) 10/31/2022   Acute CHF (congestive heart failure) ([place]) 10/30/2022   Acute hepatic encephalopathy ([place]) 05/04/2022   Sepsis ([place]) 05/03/2022   Acute pyelonephritis 05/03/2022   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/12/2019   Iron deficiency anemia 10/14/2016   Blood loss anemia    Hernia, hiatal    Pancytopenia ([place]) 07/25/2016   Gallstone     Orientation RESPIRATION BLADDER Height & Weight     Self, Time, Situation, Place  Normal Continent Weight:   Height:     BEHAVIORAL SYMPTOMS/MOOD NEUROLOGICAL BOWEL NUTRITION STATUS      Continent Diet (Carb Modified 1600-2000 cal per day)  AMBULATORY STATUS COMMUNICATION OF NEEDS Skin   Limited Assist Verbally Normal                        Personal Care Assistance Level of Assistance  Bathing, Feeding, Dressing Bathing Assistance: Limited assistance Feeding assistance: Limited assistance Dressing Assistance: Limited assistance     Functional Limitations Info  Sight, Hearing, Speech Sight Info: Adequate Hearing Info: Adequate Speech Info: Adequate    SPECIAL CARE FACTORS FREQUENCY  PT (By licensed PT), OT (By licensed OT)     PT Frequency: 5 times per day OT Frequency: 5 times per day            Contractures Contractures Info: Not present    Additional Factors Info  Code Status, Allergies Code Status Info: Full Allergies Info: NKA           Current Medications (12/02/2022):  This is the current hospital active medication list Current Facility-Administered Medications  Medication Dose Route Frequency Provider Last Rate Last Admin   albuterol (PROVENTIL) (2.5 MG/3ML) 0.083% nebulizer solution 2.5 mg  2.5 mg Nebulization Q4H PRN Alison Murray, RPH       allopurinol (ZYLOPRIM) tablet 100 mg  100 mg Oral Daily Lucillie Garfinkel, MD   100 mg at 12/02/22 1017   aspirin chewable tablet 81 mg  81 mg Oral QHS Lucillie Garfinkel, MD   81 mg at 12/01/22 2116   atorvastatin (LIPITOR) tablet 40 mg  40 mg Oral Daily Lucillie Garfinkel, MD   40 mg at 12/02/22 1017   cholecalciferol (VITAMIN D3) 25 MCG (1000 UNIT) tablet 1,000 Units  1,000 Units Oral Daily Lucillie Garfinkel, MD  1,000 Units at 12/02/22 1017   ferrous sulfate tablet 325 mg  325 mg Oral Q breakfast Lucillie Garfinkel, MD   325 mg at 12/02/22 0836   fesoterodine (TOVIAZ) tablet 4 mg  4 mg Oral Q2200 Lucillie Garfinkel, MD   4 mg at 12/01/22 2116   furosemide (LASIX) tablet 40 mg  40 mg Oral Daily Alison Murray, RPH   40 mg at 12/02/22 1017   gabapentin (NEURONTIN) capsule 600 mg  600 mg Oral QHS Lucillie Garfinkel, MD   600 mg at 12/01/22 2116   imipramine (TOFRANIL) tablet 100 mg  100 mg Oral QHS Lucillie Garfinkel, MD   100 mg at 12/01/22 2116   lactulose (CHRONULAC) 10 GM/15ML solution 20 g  20 g  Oral TID Lucillie Garfinkel, MD   20 g at 12/02/22 1016   pantoprazole (PROTONIX) EC tablet 40 mg  40 mg Oral Daily Alison Murray, RPH   40 mg at 12/02/22 1017   spironolactone (ALDACTONE) tablet 50 mg  50 mg Oral Daily Alison Murray, RPH   50 mg at 12/02/22 1017   Current Outpatient Medications  Medication Sig Dispense Refill   albuterol (PROVENTIL HFA;VENTOLIN HFA) 108 (90 Base) MCG/ACT inhaler Inhale 2 puffs into the lungs every 4 (four) hours as needed for wheezing or shortness of breath.     alendronate (FOSAMAX) 70 MG tablet Take 70 mg by mouth every Wednesday.      allopurinol (ZYLOPRIM) 100 MG tablet Take 100 mg by mouth daily.     aspirin 81 MG chewable tablet Chew 81 mg by mouth at bedtime.     atorvastatin (LIPITOR) 40 MG tablet Take 40 mg by mouth daily.      cholecalciferol (VITAMIN D) 1000 units tablet Take 1,000 Units by mouth daily.     ferrous sulfate 325 (65 FE) MG EC tablet Take 325 mg by mouth daily with breakfast.     furosemide (LASIX) 40 MG tablet Take 1 tablet (40 mg total) by mouth daily. 90 tablet 1   gabapentin (NEURONTIN) 300 MG capsule Take 2 capsules (600 mg total) by mouth at bedtime. 60 capsule 0   imipramine (TOFRANIL) 50 MG tablet Take 100 mg by mouth at bedtime.      lactulose (CHRONULAC) 10 GM/15ML solution Take 20 g (30 mL) 1-3 times a day for a goal of 1-2 bowel movements a day. 473 mL 2   omeprazole (PRILOSEC) 40 MG capsule Take 40 mg by mouth daily.      spironolactone (ALDACTONE) 50 MG tablet Take 1 tablet (50 mg total) by mouth daily. 30 tablet 0   tolterodine (DETROL LA) 4 MG 24 hr capsule Take 4 mg by mouth at bedtime.       Discharge Medications: Please see discharge summary for a list of discharge medications.  Relevant Imaging Results:  Relevant Lab Results:   Additional Information SS#: [id number]  Shelbie Hutching, RN

## 2022-12-03 NOTE — ED Notes (Signed)
Patient sleeping comfortably at this time.  Respirations even and unlabored.

## 2022-12-03 NOTE — TOC Transition Note (Signed)
Transition of Care [facility]) - CM/SW Discharge Note   Patient Details  Name: Carla Huerta MRN: [id number] Date of Birth: 09/11/1951  [facility]) CM/SW Contact:  Shelbie Hutching, RN Phone Number: 12/03/2022, 12:35 PM   Clinical Narrative:    Patient will be going to room 701 at [facility].  ED RN called report to [phone].  ED secretary has set up [facility] for transport.  RNCM called and updated husband that patient will discharge from ED today.    Final next level of care: Skilled Nursing Facility Barriers to Discharge: Barriers Resolved   Patient Goals and CMS Choice CMS Medicare.gov Compare Post Acute Care list provided to:: Patient Choice offered to / list presented to : Patient, Spouse  Discharge Placement     Existing PASRR number confirmed : 12/02/22          Patient chooses bed at: [facility] [place] Patient to be transferred to facility by: [facility] Name of family member notified: [name]- husband Patient and family notified of of transfer: 12/03/22  Discharge Plan and Services Additional resources added to the After Visit Summary for     Discharge Planning Services: CM Consult Post Acute Care Choice: [place]          DME Arranged: N/A DME Agency: NA       HH Arranged: NA HH Agency: NA        Social Determinants of Health (SDOH) Interventions SDOH Screenings   Food Insecurity: No Food Insecurity (11/24/2022)  Housing: Low Risk  (11/24/2022)  Transportation Needs: No Transportation Needs (11/24/2022)  Utilities: Not At Risk (11/24/2022)  Tobacco Use: High Risk (12/01/2022)     Readmission Risk Interventions     No data to display

## 2022-12-03 NOTE — ED Provider Notes (Signed)
-----------------------------------------   5:53 AM on 12/03/2022 -----------------------------------------   Blood pressure 123/84, pulse 92, temperature 97.7 F (36.5 C), temperature source Oral, resp. rate 18, SpO2 96 %.  The patient is sleeping comfortably at this time.  There have been no acute events since the last update.  Awaiting disposition plan from social work team.      Arta Silence, MD 12/03/22 [phone]

## 2022-12-03 NOTE — TOC Progression Note (Signed)
Transition of Care [facility]) - Progression Note    Patient Details  Name: Carla Huerta MRN: [id number] Date of Birth: 06-22-1951  Transition of Care [facility]) CM/SW Contact  Shelbie Hutching, RN Phone Number: 12/03/2022, 9:00 AM  Clinical Narrative:    Patient has been approved for [facility] from 1/22 - 1/24, review due 1/24. Reference QH:[id number]. Plan KPTW:[id number].  Reached out to [place] at [facility], she says that patient can come at noon going to private room 701 but the room needs to be cleaned.   ED RN can call report at 12 noon.    Expected Discharge Plan: Skilled Nursing Facility Barriers to Discharge: SNF Pending bed offer  Expected Discharge Plan and Services   Discharge Planning Services: CM Consult Post Acute Care Choice: [place] arrangements for the past 2 months: Single Family Home                 DME Arranged: N/A DME Agency: NA       HH Arranged: NA HH Agency: NA         Social Determinants of Health (SDOH) Interventions SDOH Screenings   Food Insecurity: No Food Insecurity (11/24/2022)  Housing: Low Risk  (11/24/2022)  Transportation Needs: No Transportation Needs (11/24/2022)  Utilities: Not At Risk (11/24/2022)  Tobacco Use: High Risk (12/01/2022)    Readmission Risk Interventions     No data to display

## 2022-12-03 NOTE — ED Notes (Signed)
Report called to Apolonio Schneiders at [facility]

## 2022-12-03 NOTE — ED Notes (Signed)
[facility]  CALLED  FOR  TRANSPORT  TO  [facility] 

## 2022-12-03 NOTE — ED Provider Notes (Signed)
Vitals:   12/02/22 1752 12/02/22 2303  BP: 117/62 123/84  Pulse: 80 92  Resp: 18 18  Temp:  97.7 F (36.5 C)  SpO2: 98% 96%     Patient is currently alert resting comfortably.  Patient aware of the plan to go to [facility] anticipating discharge sometime around about noon.  No distress, resting comfortably normal vital signs.   Delman Kitten, MD 12/03/22 1034

## 2022-12-04 ENCOUNTER — Emergency Department: Payer: Medicare HMO

## 2022-12-04 ENCOUNTER — Other Ambulatory Visit: Payer: Self-pay

## 2022-12-04 ENCOUNTER — Inpatient Hospital Stay
Admission: EM | Admit: 2022-12-04 | Discharge: 2022-12-13 | DRG: 442 | Disposition: A | Discharge status: Skilled Nursing Facility | Payer: Medicare HMO | Attending: Student | Admitting: Student

## 2022-12-04 DIAGNOSIS — G4733 Obstructive sleep apnea (adult) (pediatric): Secondary | ICD-10-CM | POA: Diagnosis not present

## 2022-12-04 DIAGNOSIS — N179 Acute kidney failure, unspecified: Secondary | ICD-10-CM

## 2022-12-04 DIAGNOSIS — E872 Acidosis, unspecified: Secondary | ICD-10-CM | POA: Insufficient documentation

## 2022-12-04 DIAGNOSIS — K7460 Unspecified cirrhosis of liver: Secondary | ICD-10-CM | POA: Diagnosis present

## 2022-12-04 DIAGNOSIS — Z1152 Encounter for screening for COVID-19: Secondary | ICD-10-CM | POA: Diagnosis not present

## 2022-12-04 DIAGNOSIS — D696 Thrombocytopenia, unspecified: Secondary | ICD-10-CM | POA: Insufficient documentation

## 2022-12-04 DIAGNOSIS — J449 Chronic obstructive pulmonary disease, unspecified: Secondary | ICD-10-CM | POA: Insufficient documentation

## 2022-12-04 DIAGNOSIS — K766 Portal hypertension: Secondary | ICD-10-CM | POA: Diagnosis not present

## 2022-12-04 DIAGNOSIS — Z6841 Body Mass Index (BMI) 40.0 and over, adult: Secondary | ICD-10-CM

## 2022-12-04 DIAGNOSIS — Y929 Unspecified place or not applicable: Secondary | ICD-10-CM | POA: Diagnosis not present

## 2022-12-04 DIAGNOSIS — N1832 Chronic kidney disease, stage 3b: Secondary | ICD-10-CM | POA: Insufficient documentation

## 2022-12-04 DIAGNOSIS — R188 Other ascites: Secondary | ICD-10-CM | POA: Diagnosis present

## 2022-12-04 DIAGNOSIS — E785 Hyperlipidemia, unspecified: Secondary | ICD-10-CM

## 2022-12-04 DIAGNOSIS — Z79899 Other long term (current) drug therapy: Secondary | ICD-10-CM | POA: Diagnosis not present

## 2022-12-04 DIAGNOSIS — N189 Chronic kidney disease, unspecified: Secondary | ICD-10-CM | POA: Diagnosis not present

## 2022-12-04 DIAGNOSIS — Z7982 Long term (current) use of aspirin: Secondary | ICD-10-CM

## 2022-12-04 DIAGNOSIS — G4700 Insomnia, unspecified: Secondary | ICD-10-CM | POA: Diagnosis present

## 2022-12-04 DIAGNOSIS — K7581 Nonalcoholic steatohepatitis (NASH): Secondary | ICD-10-CM | POA: Diagnosis present

## 2022-12-04 DIAGNOSIS — I10 Essential (primary) hypertension: Secondary | ICD-10-CM | POA: Insufficient documentation

## 2022-12-04 DIAGNOSIS — I5032 Chronic diastolic (congestive) heart failure: Secondary | ICD-10-CM | POA: Diagnosis present

## 2022-12-04 DIAGNOSIS — Z87891 Personal history of nicotine dependence: Secondary | ICD-10-CM

## 2022-12-04 DIAGNOSIS — I130 Hypertensive heart and chronic kidney disease with heart failure and stage 1 through stage 4 chronic kidney disease, or unspecified chronic kidney disease: Secondary | ICD-10-CM | POA: Diagnosis present

## 2022-12-04 DIAGNOSIS — K8020 Calculus of gallbladder without cholecystitis without obstruction: Secondary | ICD-10-CM | POA: Diagnosis present

## 2022-12-04 DIAGNOSIS — E860 Dehydration: Secondary | ICD-10-CM | POA: Diagnosis present

## 2022-12-04 DIAGNOSIS — D6959 Other secondary thrombocytopenia: Secondary | ICD-10-CM | POA: Diagnosis present

## 2022-12-04 DIAGNOSIS — G629 Polyneuropathy, unspecified: Secondary | ICD-10-CM

## 2022-12-04 DIAGNOSIS — Z91040 Latex allergy status: Secondary | ICD-10-CM

## 2022-12-04 DIAGNOSIS — F32A Depression, unspecified: Secondary | ICD-10-CM | POA: Diagnosis not present

## 2022-12-04 DIAGNOSIS — M109 Gout, unspecified: Secondary | ICD-10-CM | POA: Diagnosis present

## 2022-12-04 DIAGNOSIS — K7682 Hepatic encephalopathy: Secondary | ICD-10-CM | POA: Diagnosis not present

## 2022-12-04 DIAGNOSIS — K219 Gastro-esophageal reflux disease without esophagitis: Secondary | ICD-10-CM | POA: Diagnosis present

## 2022-12-04 DIAGNOSIS — W19XXXA Unspecified fall, initial encounter: Secondary | ICD-10-CM | POA: Diagnosis present

## 2022-12-04 DIAGNOSIS — E7220 Disorder of urea cycle metabolism, unspecified: Principal | ICD-10-CM

## 2022-12-04 DIAGNOSIS — Z96651 Presence of right artificial knee joint: Secondary | ICD-10-CM | POA: Diagnosis present

## 2022-12-04 DIAGNOSIS — S0990XA Unspecified injury of head, initial encounter: Secondary | ICD-10-CM | POA: Diagnosis present

## 2022-12-04 LAB — URINALYSIS, ROUTINE W REFLEX MICROSCOPIC
Bilirubin Urine: NEGATIVE
Glucose, UA: NEGATIVE mg/dL
Hgb urine dipstick: NEGATIVE
Ketones, ur: NEGATIVE mg/dL
Leukocytes,Ua: NEGATIVE
Nitrite: NEGATIVE
Protein, ur: NEGATIVE mg/dL
Specific Gravity, Urine: 1.008 (ref 1.005–1.030)
pH: 7 (ref 5.0–8.0)

## 2022-12-04 LAB — COMPREHENSIVE METABOLIC PANEL
ALT: 38 U/L (ref 0–44)
AST: 94 U/L — ABNORMAL HIGH (ref 15–41)
Albumin: 3.7 g/dL (ref 3.5–5.0)
Alkaline Phosphatase: 88 U/L (ref 38–126)
Anion gap: 15 (ref 5–15)
BUN: 40 mg/dL — ABNORMAL HIGH (ref 8–23)
CO2: 21 mmol/L — ABNORMAL LOW (ref 22–32)
Calcium: 9.8 mg/dL (ref 8.9–10.3)
Chloride: 100 mmol/L (ref 98–111)
Creatinine, Ser: 1.84 mg/dL — ABNORMAL HIGH (ref 0.44–1.00)
GFR, Estimated: 29 mL/min — ABNORMAL LOW (ref >60)
Glucose, Bld: 112 mg/dL — ABNORMAL HIGH (ref 70–99)
Potassium: 4.3 mmol/L (ref 3.5–5.1)
Sodium: 136 mmol/L (ref 135–145)
Total Bilirubin: 1.1 mg/dL (ref 0.3–1.2)
Total Protein: 7.6 g/dL (ref 6.5–8.1)

## 2022-12-04 LAB — TROPONIN I (HIGH SENSITIVITY)
Troponin I (High Sensitivity): 5 ng/L (ref <18)
Troponin I (High Sensitivity): 6 ng/L (ref <18)

## 2022-12-04 LAB — CBC WITH DIFFERENTIAL/PLATELET
Abs Immature Granulocytes: 0.01 10*3/uL (ref 0.00–0.07)
Basophils Absolute: 0 10*3/uL (ref 0.0–0.1)
Basophils Relative: 0 %
Eosinophils Absolute: 0 10*3/uL (ref 0.0–0.5)
Eosinophils Relative: 0 %
HCT: 37.6 % (ref 36.0–46.0)
Hemoglobin: 11.6 g/dL — ABNORMAL LOW (ref 12.0–15.0)
Immature Granulocytes: 0 %
Lymphocytes Relative: 14 %
Lymphs Abs: 0.7 10*3/uL (ref 0.7–4.0)
MCH: 26.9 pg (ref 26.0–34.0)
MCHC: 30.9 g/dL (ref 30.0–36.0)
MCV: 87.2 fL (ref 80.0–100.0)
Monocytes Absolute: 0.4 10*3/uL (ref 0.1–1.0)
Monocytes Relative: 9 %
Neutro Abs: 3.5 10*3/uL (ref 1.7–7.7)
Neutrophils Relative %: 77 %
Platelets: 119 10*3/uL — ABNORMAL LOW (ref 150–400)
RBC: 4.31 MIL/uL (ref 3.87–5.11)
RDW: 23.7 % — ABNORMAL HIGH (ref 11.5–15.5)
Smear Review: NORMAL
WBC: 4.6 10*3/uL (ref 4.0–10.5)
nRBC: 0 % (ref 0.0–0.2)

## 2022-12-04 LAB — LACTIC ACID, PLASMA: Lactic Acid, Venous: 1.6 mmol/L (ref 0.5–1.9)

## 2022-12-04 LAB — LIPASE, BLOOD: Lipase: 47 U/L (ref 11–51)

## 2022-12-04 LAB — AMMONIA: Ammonia: 141 umol/L — ABNORMAL HIGH (ref 9–35)

## 2022-12-04 MED ORDER — TRAZODONE HCL 50 MG PO TABS: 25.0000 mg | ORAL_TABLET | Freq: Every evening | ORAL | Status: DC | PRN

## 2022-12-04 MED ORDER — LACTULOSE 10 GM/15ML PO SOLN
20.0000 g | Freq: Once | ORAL | Status: AC
  Administered 2022-12-04: 20 g via ORAL
  Filled 2022-12-04: qty 30

## 2022-12-04 MED ORDER — ALLOPURINOL 100 MG PO TABS
100.0000 mg | ORAL_TABLET | Freq: Every day | ORAL | Status: DC
  Administered 2022-12-05 – 2022-12-13 (×9): 100 mg via ORAL
  Filled 2022-12-04 (×9): qty 1

## 2022-12-04 MED ORDER — FUROSEMIDE 40 MG PO TABS
40.0000 mg | ORAL_TABLET | Freq: Every day | ORAL | Status: DC
  Administered 2022-12-04: 40 mg via ORAL
  Filled 2022-12-04: qty 1

## 2022-12-04 MED ORDER — ONDANSETRON HCL 4 MG PO TABS: 4.0000 mg | ORAL_TABLET | Freq: Four times a day (QID) | ORAL | Status: DC | PRN

## 2022-12-04 MED ORDER — ACETAMINOPHEN 650 MG RE SUPP: 650.0000 mg | Freq: Four times a day (QID) | RECTAL | Status: DC | PRN

## 2022-12-04 MED ORDER — ATORVASTATIN CALCIUM 20 MG PO TABS
40.0000 mg | ORAL_TABLET | Freq: Every day | ORAL | Status: DC
  Administered 2022-12-05 – 2022-12-13 (×9): 40 mg via ORAL
  Filled 2022-12-04 (×9): qty 2

## 2022-12-04 MED ORDER — VITAMIN D 25 MCG (1000 UNIT) PO TABS
1000.0000 [IU] | ORAL_TABLET | Freq: Every day | ORAL | Status: DC
  Administered 2022-12-04 – 2022-12-11 (×8): 1000 [IU] via ORAL
  Filled 2022-12-04 (×8): qty 1

## 2022-12-04 MED ORDER — ALENDRONATE SODIUM 70 MG PO TABS: 70.0000 mg | ORAL_TABLET | ORAL | Status: DC

## 2022-12-04 MED ORDER — LACTULOSE 10 GM/15ML PO SOLN
30.0000 g | Freq: Three times a day (TID) | ORAL | Status: DC
  Administered 2022-12-05 – 2022-12-08 (×10): 30 g via ORAL
  Filled 2022-12-04 (×11): qty 60

## 2022-12-04 MED ORDER — PANTOPRAZOLE SODIUM 40 MG PO TBEC
40.0000 mg | DELAYED_RELEASE_TABLET | Freq: Every day | ORAL | Status: DC
  Administered 2022-12-04 – 2022-12-13 (×10): 40 mg via ORAL
  Filled 2022-12-04 (×10): qty 1

## 2022-12-04 MED ORDER — IMIPRAMINE HCL 50 MG PO TABS
100.0000 mg | ORAL_TABLET | Freq: Every day | ORAL | Status: DC
  Administered 2022-12-04 – 2022-12-12 (×9): 100 mg via ORAL
  Filled 2022-12-04 (×9): qty 2

## 2022-12-04 MED ORDER — SODIUM CHLORIDE 0.9 % IV SOLN: INTRAVENOUS | Status: DC

## 2022-12-04 MED ORDER — SPIRONOLACTONE 25 MG PO TABS
50.0000 mg | ORAL_TABLET | Freq: Every day | ORAL | Status: DC
  Administered 2022-12-04: 50 mg via ORAL
  Filled 2022-12-04: qty 2

## 2022-12-04 MED ORDER — FERROUS SULFATE 325 (65 FE) MG PO TABS
325.0000 mg | ORAL_TABLET | Freq: Every day | ORAL | Status: DC
  Administered 2022-12-05 – 2022-12-13 (×9): 325 mg via ORAL
  Filled 2022-12-04 (×9): qty 1

## 2022-12-04 MED ORDER — LACTULOSE 10 GM/15ML PO SOLN
30.0000 g | Freq: Once | ORAL | Status: AC
  Administered 2022-12-04: 30 g via ORAL
  Filled 2022-12-04: qty 60

## 2022-12-04 MED ORDER — ACETAMINOPHEN 325 MG PO TABS
650.0000 mg | ORAL_TABLET | Freq: Four times a day (QID) | ORAL | Status: DC | PRN
  Filled 2022-12-04: qty 2

## 2022-12-04 MED ORDER — MAGNESIUM HYDROXIDE 400 MG/5ML PO SUSP: 30.0000 mL | Freq: Every day | ORAL | Status: DC | PRN

## 2022-12-04 MED ORDER — ONDANSETRON HCL 4 MG/2ML IJ SOLN: 4.0000 mg | Freq: Four times a day (QID) | INTRAMUSCULAR | Status: DC | PRN

## 2022-12-04 MED ORDER — ENOXAPARIN SODIUM 80 MG/0.8ML IJ SOSY
0.5000 mg/kg | PREFILLED_SYRINGE | INTRAMUSCULAR | Status: DC
  Administered 2022-12-04 – 2022-12-12 (×9): 70 mg via SUBCUTANEOUS
  Filled 2022-12-04 (×9): qty 0.7

## 2022-12-04 MED ORDER — ALBUTEROL SULFATE (2.5 MG/3ML) 0.083% IN NEBU: 3.0000 mL | INHALATION_SOLUTION | RESPIRATORY_TRACT | Status: DC | PRN

## 2022-12-04 MED ORDER — SODIUM CHLORIDE 0.9 % IV BOLUS
500.0000 mL | Freq: Once | INTRAVENOUS | Status: AC
  Administered 2022-12-04: 500 mL via INTRAVENOUS

## 2022-12-04 MED ORDER — FESOTERODINE FUMARATE ER 4 MG PO TB24
4.0000 mg | ORAL_TABLET | Freq: Every day | ORAL | Status: DC
  Administered 2022-12-05 – 2022-12-13 (×9): 4 mg via ORAL
  Filled 2022-12-04 (×9): qty 1

## 2022-12-04 MED ORDER — GABAPENTIN 300 MG PO CAPS
600.0000 mg | ORAL_CAPSULE | Freq: Every day | ORAL | Status: DC
  Administered 2022-12-04 – 2022-12-12 (×9): 600 mg via ORAL
  Filled 2022-12-04 (×9): qty 2

## 2022-12-04 MED ORDER — ASPIRIN 81 MG PO CHEW
81.0000 mg | CHEWABLE_TABLET | Freq: Every day | ORAL | Status: DC
  Administered 2022-12-04 – 2022-12-12 (×9): 81 mg via ORAL
  Filled 2022-12-04 (×9): qty 1

## 2022-12-04 NOTE — ED Provider Triage Note (Signed)
Emergency Medicine Provider Triage Evaluation Note  Carla Huerta , a 72 y.o. female  was evaluated in triage.  Pt complains of vomiting. No diarrhea. No abdominal pain. No CP/SOB. No HA.  Review of Systems  Positive: vomiting Negative: abdominal pain. No CP/SOB. No HA.   Physical Exam  There were no vitals taken for this visit. Gen:   Awake, no distress   Resp:  Normal effort, on [place], baseline per patient  MSK:   Moves extremities without difficulty  Other:  AOx3  Medical Decision Making  Medically screening exam initiated at 12:13 PM.  Appropriate orders placed.  LENER VENTRESCA was informed that the remainder of the evaluation will be completed by another provider, this initial triage assessment does not replace that evaluation, and the importance of remaining in the ED until their evaluation is complete.     Marquette Old, PA-C 12/04/22 1219

## 2022-12-04 NOTE — H&P (Incomplete)
[place]   PATIENT NAME: Carla Huerta    MR#:  [id number]  DATE OF BIRTH:  14-Dec-1950  DATE OF ADMISSION:  12/04/2022  PRIMARY CARE PHYSICIAN: Denton Lank, MD   Patient is coming from: Home  REQUESTING/REFERRING PHYSICIAN: Duffy Bruce, MD  CHIEF COMPLAINT:   Chief Complaint  Patient presents with   Emesis    HISTORY OF PRESENT ILLNESS:  Carla Huerta is a 72 y.o. female with medical history significant for COPD, CHF, depression, GERD, hypertension and known alcoholic hepatosteatosis, as well as stage IIIa chronic kidney disease, who presented to the emergency room with acute onset of vomiting since this morning at her SNF which she was discharged yesterday after being managed for altered mental status and generalized weakness over the last several days.  Her last bowel movement was 3 days ago per her report.  She was more confused, weak and nauseous today she admitted to shaking denies any diarrhea or bleeding diathesis.  No chest pain or palpitations.  No fever or chills.  No cough or wheezing.  She has chronic dyspnea with left diaphragmatic paralysis  ED Course: Upon presentation to the ER heart rate was 104 with otherwise normal vital signs.  Labs revealed BUN of 40 and creatinine 1.84 with CO2 of 21 on ammonia levels 141.  Lactic acid was 1.6 and high sensitive troponin was 5 and later 6 CBC showed hemoglobin 11.6 and hematocrit 37.6 with platelets 119.  UA was unremarkable. EKG as reviewed by me : Normal sinus rhythm with a rate of 97 with voltage criteria for LVH. Imaging: Abdominal and pelvic CT scan revealed aortic atherosclerosis, nodular contour of the liver suggesting cirrhosis, cholelithiasis without cholecystitis and no other acute findings.  Noncontrasted CT scan revealed no acute intra abnormalities.  The patient was given 20 g of F 30 g of p.o. lactulose and 500 mill IV normal saline.  She will be admitted to a medical telemetry bed for further  evaluation and management. PAST MEDICAL HISTORY:   Past Medical History:  Diagnosis Date   Anemia    CHF (congestive heart failure) (HCC)    COPD (chronic obstructive pulmonary disease) (HCC)    Cough    Depression    Diaphragm paralysis    Gout    Hypertension    Hyponatremia    Insomnia    Liver disease    Nonalcoholic hepatosteatosis    PONV (postoperative nausea and vomiting)    Renal disorder    Sleep apnea    Splenic laceration    Vertigo   -Stage IIIa chronic kidney disease  PAST SURGICAL HISTORY:   Past Surgical History:  Procedure Laterality Date   APPENDECTOMY     when she was 67 yearsa old   BREAST BIOPSY Left 06/17/2019   affirm bx distortion with calcs, coil clip, ADH   BREAST EXCISIONAL BIOPSY Left 09/23/2019   neg   BREAST LUMPECTOMY Left 09/23/2019   ADH   BREAST LUMPECTOMY WITH NEEDLE LOCALIZATION Left 09/23/2019   Procedure: LEFT BREAST LUMPECTOMY WITH NEEDLE LOCALIZATION;  Surgeon: Jovita Kussmaul, MD;  Location: [facility] OR[facility];  Service: General;  Laterality: Left;   COLONOSCOPY WITH PROPOFOL N/A 10/01/2016   Procedure: COLONOSCOPY WITH PROPOFOL;  Surgeon: Lucilla Lame, MD;  Location: [facility] END[facility];  Service: Endoscopy;  Laterality: N/A;   ESOPHAGOGASTRODUODENOSCOPY (EGD) WITH PROPOFOL N/A 10/01/2016   Procedure: ESOPHAGOGASTRODUODENOSCOPY (EGD) WITH PROPOFOL;  Surgeon: Lucilla Lame, MD;  Location: [facility] END[facility]COPY;  Service: Endoscopy;  Laterality: N/A;   LIVER BIOPSY     2015   REPLACEMENT TOTAL KNEE Right 2002   TONSILLECTOMY AND ADENOIDECTOMY     when pt was 72 years old    SOCIAL HISTORY:   Social History   Tobacco Use   Smoking status: Former    Years: 1.00    Types: Cigarettes   Smokeless tobacco: Current  Substance Use Topics   Alcohol use: No    FAMILY HISTORY:   Family History  Problem Relation Age of Onset   Breast cancer Sister    Breast cancer Paternal Grandmother    Breast cancer Other     DRUG ALLERGIES:   Allergies   Allergen Reactions   Latex Rash    Local reaction where it touches her body    REVIEW OF SYSTEMS:   ROS As per history of present illness. All pertinent systems were reviewed above. Constitutional, HEENT, cardiovascular, respiratory, GI, GU, musculoskeletal, neuro, psychiatric, endocrine, integumentary and hematologic systems were reviewed and are otherwise negative/unremarkable except for positive findings mentioned above in the HPI.   MEDICATIONS AT HOME:   Prior to Admission medications   Medication Sig Start Date End Date Taking? Authorizing Provider  albuterol (PROVENTIL HFA;VENTOLIN HFA) 108 (90 Base) MCG/ACT inhaler Inhale 2 puffs into the lungs every 4 (four) hours as needed for wheezing or shortness of breath.    [provider]  alendronate (FOSAMAX) 70 MG tablet Take 70 mg by mouth every Wednesday.     [provider]  allopurinol (ZYLOPRIM) 100 MG tablet Take 100 mg by mouth daily.    [provider]  aspirin 81 MG chewable tablet Chew 81 mg by mouth at bedtime.    [provider]  atorvastatin (LIPITOR) 40 MG tablet Take 40 mg by mouth daily.     [provider]  cholecalciferol (VITAMIN D) 1000 units tablet Take 1,000 Units by mouth daily.    [provider]  ferrous sulfate 325 (65 FE) MG EC tablet Take 325 mg by mouth daily with breakfast.    [provider]  furosemide (LASIX) 40 MG tablet Take 1 tablet (40 mg total) by mouth daily. 11/30/22 05/29/23  Almon Hercules, MD  gabapentin (NEURONTIN) 300 MG capsule Take 2 capsules (600 mg total) by mouth at bedtime. 11/26/22 12/26/22  Almon Hercules, MD  imipramine (TOFRANIL) 50 MG tablet Take 100 mg by mouth at bedtime.     [provider]  lactulose (CHRONULAC) 10 GM/15ML solution Take 20 g (30 mL) 1-3 times a day for a goal of 1-2 bowel movements a day. 11/26/22   Almon Hercules, MD  omeprazole (PRILOSEC) 40 MG capsule Take 40 mg by mouth daily.     [provider]  spironolactone (ALDACTONE) 50 MG tablet Take 1 tablet (50 mg total) by mouth daily. 11/30/22 12/30/22  Almon Hercules, MD  tolterodine (DETROL LA) 4 MG 24 hr capsule Take 4 mg by mouth at bedtime.    [provider]      VITAL SIGNS:  Blood pressure 103/87, pulse 96, temperature 98.2 F (36.8 C), temperature source Oral, resp. rate 15, height 5\' 5"  (1.651 m), weight (!) 140.2 kg, SpO2 94 %.  PHYSICAL EXAMINATION:  Physical Exam  GENERAL:  72 y.o.-year-old obese Caucasian female patient lying in the bed with no acute distress.  She was mildly somnolent but easily arousable. EYES: Pupils equal, round, reactive to light and accommodation. No scleral icterus. Extraocular muscles  intact.  HEENT: Head atraumatic, normocephalic. Oropharynx and nasopharynx clear.  NECK:  Supple, no jugular venous distention. No thyroid enlargement, no tenderness.  LUNGS: Normal breath sounds bilaterally, no wheezing, rales,rhonchi or crepitation. No use of accessory muscles of respiration.  CARDIOVASCULAR: Regular rate and rhythm, S1, S2 normal. No murmurs, rubs, or gallops.  ABDOMEN: Soft, nondistended, nontender. Bowel sounds present. No organomegaly or mass.  EXTREMITIES: No pedal edema, cyanosis, or clubbing.  NEUROLOGIC: Cranial nerves II through XII are intact. Muscle strength 5/5 in all extremities. Sensation intact. Gait not checked.  PSYCHIATRIC: The patient is mildly somnolent but easily arousable.  The hide the EKG will hide disease in the [place] [facility] for high-level [place] this could.  Normal affect and good eye contact. SKIN: No obvious rash, lesion, or ulcer.   LABORATORY PANEL:   CBC Recent Labs  Lab 12/04/22 1216  WBC 4.6  HGB 11.6*  HCT 37.6  PLT 119*   ------------------------------------------------------------------------------------------------------------------  Chemistries  Recent Labs  Lab 12/04/22 1216  NA 136  K 4.3  CL 100  CO2 21*  GLUCOSE 112*   BUN 40*  CREATININE 1.84*  CALCIUM 9.8  AST 94*  ALT 38  ALKPHOS 88  BILITOT 1.1   ------------------------------------------------------------------------------------------------------------------  Cardiac Enzymes No results for input(s): "TROPONINI" in the last 168 hours. ------------------------------------------------------------------------------------------------------------------  RADIOLOGY:  CT ABDOMEN PELVIS WO CONTRAST  Result Date: 12/04/2022 CLINICAL DATA:  Abdominal pain.  Acute nonlocalized EXAM: CT ABDOMEN AND PELVIS WITHOUT CONTRAST TECHNIQUE: Multidetector CT imaging of the abdomen and pelvis was performed following the standard protocol without IV contrast. RADIATION DOSE REDUCTION: This exam was performed according to the departmental dose-optimization program which includes automated exposure control, adjustment of the mA and/or kV according to patient size and/or use of iterative reconstruction technique. COMPARISON:  None Available. FINDINGS: Lower chest: Lung bases are clear. Hepatobiliary: Liver has a nodular contour. Several gallstones noted in the neck of the gallbladder. No gallbladder distension. Common bile duct normal. Pancreas: Pancreas is normal. No ductal dilatation. No pancreatic inflammation. Spleen: Calcifications along the capsule of the spleen suggest prior trauma or infection. Acute findings. Adrenals/urinary tract: Adrenal glands and kidneys are normal. The ureters and bladder normal. Stomach/Bowel: The stomach, duodenum, and small bowel normal. The colon and rectosigmoid colon are normal. Vascular/Lymphatic: Abdominal aorta is normal caliber with atherosclerotic calcification. There is no retroperitoneal or periportal lymphadenopathy. No pelvic lymphadenopathy. Reproductive: Uterus and adnexa unremarkable. Other: No free fluid. Musculoskeletal: No aggressive osseous lesion. IMPRESSION: 1. No acute findings in the abdomen pelvis. 2. Cholelithiasis without  evidence cholecystitis. 3. Nodular contour of the liver suggests cirrhosis. 4. Aortic atherosclerosis. Aortic Atherosclerosis (ICD10-I70.0). Electronically Signed   By: Genevive Bi M.D.   On: 12/04/2022 17:54   CT HEAD WO CONTRAST ( )  Result Date: 12/04/2022 CLINICAL DATA:  Confused.  Vomiting. EXAM: CT HEAD WITHOUT CONTRAST TECHNIQUE: Contiguous axial images were obtained from the base of the skull through the vertex without intravenous contrast. RADIATION DOSE REDUCTION: This exam was performed according to the departmental dose-optimization program which includes automated exposure control, adjustment of the mA and/or kV according to patient size and/or use of iterative reconstruction technique. COMPARISON:  CT head 12/01/2022 FINDINGS: Brain: There is no acute intracranial hemorrhage, extra-axial fluid collection, or acute infarct Parenchymal volume is normal for age. The ventricles are normal in size. Gray-white differentiation is preserved. Patchy hypodensity in the supratentorial white matter likely reflects sequela of chronic small-vessel ischemic change The pituitary and suprasellar region are normal. There  is no mass lesion. There is no mass effect or midline shift. Vascular: No hyperdense vessel or unexpected calcification. Skull: Normal. Negative for fracture or focal lesion. Sinuses/Orbits: The paranasal sinuses are clear. The globes and orbits are unremarkable. Other: None. IMPRESSION: Stable noncontrast head CT with no acute intracranial pathology. Electronically Signed   By: Valetta Mole M.D.   On: 12/04/2022 17:46      IMPRESSION AND PLAN:  Assessment and Plan: * Hepatic encephalopathy ([place]) - This is in the setting of nonalcoholic hepatic cirrhosis. - The patient will be admitted to a medical telemetry bed. - We will continue p.o. lactulose 30 g 3 times daily. - We will follow ammonia level. - We will obtain PT and INR. - GI consult will be obtained. - I notified Dr. Vicente Males  about the patient.  Acute kidney injury superimposed on chronic kidney disease ([place]) - We will continue-with IV normal saline and follow BMP. - We will avoid nephrotoxins.  Dyslipidemia - Statin therapy will be continued.  Depression - We will continue her antidepressant therapy.  Obstructive sleep apnea Will continue CPAP nightly.  Peripheral neuropathy - We will continue Neurontin.  Gout - We will continue her allopurinol.    DVT prophylaxis: Lovenox.  Advanced Care Planning:  Code Status: full code.  Family Communication:  The plan of care was discussed in details with the patient (and family). I answered all questions. The patient agreed to proceed with the above mentioned plan. Further management will depend upon hospital course. Disposition Plan: Back to previous home environment Consults called: GI All the records are reviewed and case discussed with ED provider.  Status is: Inpatient    At the time of the admission, it appears that the appropriate admission status for this patient is inpatient.  This is judged to be reasonable and necessary in order to provide the required intensity of service to ensure the patient's safety given the presenting symptoms, physical exam findings and initial radiographic and laboratory data in the context of comorbid conditions.  The patient requires inpatient status due to high intensity of service, high risk of further deterioration and high frequency of surveillance required.  I certify that at the time of admission, it is my clinical judgment that the patient will require inpatient hospital care extending more than 2 midnights.                            Dispo: The patient is from: Home              Anticipated d/c is to: Home              Patient currently is not medically stable to d/c.              Difficult to place patient: No  Christel Mormon M.D on 12/05/2022 at 12:50 AM  [facility]   From 7 PM-7 AM, contact  night-coverage [email or web address]  CC: Primary care physician; Denton Lank, MD

## 2022-12-04 NOTE — ED Triage Notes (Signed)
First Nurse Note:  Arrives from [facility] via [facility] [facility] [employer].  Vomiting x 90 minutes. 12.5 mg Phenergan given by staff.  VS wnl.  Patient is confused to time and place per EMS report, normal AOx4.  Currently on 4l/ [place]:  96%.  RA sats 90% per SNF staff.  Patient does not normally wear oxygen.  EMS noted vomiting from patient.

## 2022-12-04 NOTE — ED Triage Notes (Signed)
Pt comes via [facility] with c/o vomiting for over hour. Pt was given nausea meds by staff.   Pt is Alert to place and time. Pt denies any pain in belly.

## 2022-12-04 NOTE — ED Notes (Signed)
This RN took pt's temperature and updated vital signs, and pt reports feeling dizzy. MD made aware.

## 2022-12-04 NOTE — ED Provider Notes (Signed)
[facility] Provider Note    Event Date/Time   First MD Initiated Contact with Patient 12/04/22 1613     (approximate)   History   Emesis   HPI  Carla Huerta is a 72 y.o. female  here with ams, weakness. Pt was actually just discharged to facility yesterday. Reports that she has been increasingly weak over the last several days and has not been having regular BMs. Over the day today, pt has been increasingly confused, weak, and nauseous. She has been to weak to do anything today and has been shaking. She has had nausea, vomiting as well. Unable to eat anything today. No fevers, chills. No urinary sx.        Physical Exam   Triage Vital Signs: ED Triage Vitals  Enc Vitals Group     BP 12/04/22 1300 (!) 129/106     Pulse Rate 12/04/22 1300 98     Resp 12/04/22 1300 16     Temp 12/04/22 1300 98.2 F (36.8 C)     Temp Source 12/04/22 1300 Oral     SpO2 12/04/22 1300 98 %     Weight --      Height --      Head Circumference --      [place] --      Pain Score 12/04/22 1215 0     Pain Loc --      Pain Edu? --      Excl. in [place]? --     Most recent vital signs: Vitals:   12/04/22 1630 12/04/22 1700  BP: 131/81 106/75  Pulse: (!) 103 100  Resp: 18 13  Temp:    SpO2: 93% 92%     General: Awake, no distress.  CV:  Good peripheral perfusion. Mild tachycardia noted. Resp:  Normal effort. Lungs CTAB. Abd:  +distension, but no rebound or rigidity. No peritonitis. Other:  No LE edema.   ED Results / Procedures / Treatments   Labs (all labs ordered are listed, but only abnormal results are displayed) Labs Reviewed  CBC WITH DIFFERENTIAL/PLATELET - Abnormal; Notable for the following components:      Result Value   Hemoglobin 11.6 (*)    RDW 23.7 (*)    Platelets 119 (*)    All other components within normal limits  COMPREHENSIVE METABOLIC PANEL - Abnormal; Notable for the following components:   CO2 21 (*)    Glucose, Bld 112 (*)     BUN 40 (*)    Creatinine, Ser 1.84 (*)    AST 94 (*)    GFR, Estimated 29 (*)    All other components within normal limits  AMMONIA - Abnormal; Notable for the following components:   Ammonia 141 (*)    All other components within normal limits  LIPASE, BLOOD  URINALYSIS, ROUTINE W REFLEX MICROSCOPIC  TROPONIN I (HIGH SENSITIVITY)  TROPONIN I (HIGH SENSITIVITY)     EKG Normal sinus rhythm, VR 97. PR 180, QRS 104, QTc 485. No acute ST elevations or depressions. No ischemia or infarct.   RADIOLOGY CT Head: NAICA CT A/P: No acute abnormality, cholelithiasis without signs of cholecystitis   I also independently reviewed and agree with radiologist interpretations.   PROCEDURES:  Critical Care performed: No   MEDICATIONS ORDERED IN ED: Medications  sodium chloride 0.9 % bolus 500 mL (has no administration in time range)  lactulose (CHRONULAC) 10 GM/15ML solution 20 g (20 g Oral Given 12/04/22 1729)  IMPRESSION / MDM / ASSESSMENT AND PLAN / ED COURSE  I reviewed the triage vital signs and the nursing notes.                              Differential diagnosis includes, but is not limited to, hepatic encephalopathy, stroke/SDH/TBI, metabolic encephalopathy, polypharmacy, occult sepsis w/ encephalopathy.  Patient's presentation is most consistent with acute presentation with potential threat to life or bodily function.  The patient is on the cardiac monitor to evaluate for evidence of arrhythmia and/or significant heart rate changes.  72 yo F with h/o cirrhosis, COPD, CHF, here with AMS. Pt had just been seen on 1/21 after recent admission, for alterd mental status. She was tx for UTI and sent to [facility]. Now, pt here with acute hepatic encephalopathy. No signs of ongoing sepsis/infection. Ammonia 141. CT a/p obtained and is negative. CT head without CVA, SDH, or other intracranial abnormality. Lytes o/w near baseline.  Will admit for lactulose, hydration.  FINAL  CLINICAL IMPRESSION(S) / ED DIAGNOSES   Final diagnoses:  Hyperammonemia ([place])  Hepatic encephalopathy ([place])     Rx / DC Orders   ED Discharge Orders     None        Note:  This document was prepared using Dragon voice recognition software and may include unintentional dictation errors.   Duffy Bruce, MD 12/04/22 [place]

## 2022-12-05 DIAGNOSIS — I5032 Chronic diastolic (congestive) heart failure: Secondary | ICD-10-CM | POA: Insufficient documentation

## 2022-12-05 DIAGNOSIS — G629 Polyneuropathy, unspecified: Secondary | ICD-10-CM

## 2022-12-05 DIAGNOSIS — N1832 Chronic kidney disease, stage 3b: Secondary | ICD-10-CM | POA: Insufficient documentation

## 2022-12-05 DIAGNOSIS — J449 Chronic obstructive pulmonary disease, unspecified: Secondary | ICD-10-CM | POA: Insufficient documentation

## 2022-12-05 DIAGNOSIS — K7682 Hepatic encephalopathy: Secondary | ICD-10-CM | POA: Diagnosis not present

## 2022-12-05 DIAGNOSIS — E785 Hyperlipidemia, unspecified: Secondary | ICD-10-CM

## 2022-12-05 DIAGNOSIS — E872 Acidosis, unspecified: Secondary | ICD-10-CM | POA: Insufficient documentation

## 2022-12-05 DIAGNOSIS — G4733 Obstructive sleep apnea (adult) (pediatric): Secondary | ICD-10-CM | POA: Insufficient documentation

## 2022-12-05 DIAGNOSIS — D696 Thrombocytopenia, unspecified: Secondary | ICD-10-CM | POA: Insufficient documentation

## 2022-12-05 DIAGNOSIS — I10 Essential (primary) hypertension: Secondary | ICD-10-CM | POA: Insufficient documentation

## 2022-12-05 LAB — COMPREHENSIVE METABOLIC PANEL
ALT: 33 U/L (ref 0–44)
AST: 81 U/L — ABNORMAL HIGH (ref 15–41)
Albumin: 3.3 g/dL — ABNORMAL LOW (ref 3.5–5.0)
Alkaline Phosphatase: 75 U/L (ref 38–126)
Anion gap: 14 (ref 5–15)
BUN: 36 mg/dL — ABNORMAL HIGH (ref 8–23)
CO2: 18 mmol/L — ABNORMAL LOW (ref 22–32)
Calcium: 9.2 mg/dL (ref 8.9–10.3)
Chloride: 106 mmol/L (ref 98–111)
Creatinine, Ser: 1.69 mg/dL — ABNORMAL HIGH (ref 0.44–1.00)
GFR, Estimated: 32 mL/min — ABNORMAL LOW (ref >60)
Glucose, Bld: 80 mg/dL (ref 70–99)
Potassium: 4 mmol/L (ref 3.5–5.1)
Sodium: 138 mmol/L (ref 135–145)
Total Bilirubin: 1 mg/dL (ref 0.3–1.2)
Total Protein: 6.8 g/dL (ref 6.5–8.1)

## 2022-12-05 LAB — IRON AND TIBC
Iron: 47 ug/dL (ref 28–170)
Saturation Ratios: 11 % (ref 10.4–31.8)
TIBC: 412 ug/dL (ref 250–450)
UIBC: 365 ug/dL

## 2022-12-05 LAB — FOLATE: Folate: 18.5 ng/mL (ref >5.9)

## 2022-12-05 LAB — VITAMIN B12: Vitamin B-12: 842 pg/mL (ref 180–914)

## 2022-12-05 LAB — LACTIC ACID, PLASMA: Lactic Acid, Venous: 1.7 mmol/L (ref 0.5–1.9)

## 2022-12-05 LAB — CBC
HCT: 35.6 % — ABNORMAL LOW (ref 36.0–46.0)
Hemoglobin: 10.8 g/dL — ABNORMAL LOW (ref 12.0–15.0)
MCH: 26.6 pg (ref 26.0–34.0)
MCHC: 30.3 g/dL (ref 30.0–36.0)
MCV: 87.7 fL (ref 80.0–100.0)
Platelets: 122 10*3/uL — ABNORMAL LOW (ref 150–400)
RBC: 4.06 MIL/uL (ref 3.87–5.11)
RDW: 23.9 % — ABNORMAL HIGH (ref 11.5–15.5)
WBC: 4.4 10*3/uL (ref 4.0–10.5)
nRBC: 0 % (ref 0.0–0.2)

## 2022-12-05 LAB — FERRITIN: Ferritin: 46 ng/mL (ref 11–307)

## 2022-12-05 LAB — PROTIME-INR
INR: 1.3 — ABNORMAL HIGH (ref 0.8–1.2)
Prothrombin Time: 15.9 seconds — ABNORMAL HIGH (ref 11.4–15.2)

## 2022-12-05 MED ORDER — SODIUM BICARBONATE 650 MG PO TABS
1300.0000 mg | ORAL_TABLET | Freq: Two times a day (BID) | ORAL | Status: DC
  Administered 2022-12-05 – 2022-12-06 (×3): 1300 mg via ORAL
  Filled 2022-12-05 (×3): qty 2

## 2022-12-05 MED ORDER — RIFAXIMIN 550 MG PO TABS
550.0000 mg | ORAL_TABLET | Freq: Two times a day (BID) | ORAL | Status: DC
  Administered 2022-12-05 – 2022-12-13 (×17): 550 mg via ORAL
  Filled 2022-12-05 (×18): qty 1

## 2022-12-05 NOTE — Hospital Course (Addendum)
Carla Huerta is a 72 y.o. female with medical history significant for COPD, CHF, depression, GERD, hypertension and known alcoholic hepatosteatosis, as well as stage III chronic kidney disease, who presented to the emergency room with acute onset of vomiting since this morning at her SNF.  Upon arriving the hospital, was very confused, vital signs are stable, no leukocytosis.  She had a profound elevation of ammonia level, was diagnosed with hepatic encephalopathy.  Given fluids, lactulose and rifaximin.

## 2022-12-05 NOTE — Assessment & Plan Note (Signed)
-  We will continue-with IV normal saline and follow BMP. - We will avoid nephrotoxins.

## 2022-12-05 NOTE — ED Notes (Signed)
Meal tray at bedside, pt sitting upright eating.

## 2022-12-05 NOTE — Assessment & Plan Note (Signed)
-  We will continue her antidepressant therapy.

## 2022-12-05 NOTE — Assessment & Plan Note (Signed)
-  We will continue her allopurinol.

## 2022-12-05 NOTE — Assessment & Plan Note (Signed)
-  We will continue Neurontin. ?

## 2022-12-05 NOTE — Progress Notes (Signed)
  Progress Note   Patient: Carla Huerta BOF:[id number] DOB: February 16, 1951 DOA: 12/04/2022     1 DOS: the patient was seen and examined on 12/05/2022   Brief hospital course: STARLINA LAPRE is a 72 y.o. female with medical history significant for COPD, CHF, depression, GERD, hypertension and known alcoholic hepatosteatosis, as well as stage III chronic kidney disease, who presented to the emergency room with acute onset of vomiting since this morning at her SNF.  Upon arriving the hospital, was very confused, vital signs are stable, no leukocytosis.  She had a profound elevation of ammonia level, was diagnosed with hepatic encephalopathy.  Given fluids, lactulose and rifaximin.   Assessment and Plan: Hepatic encephalopathy. 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 Patient condition seem to be improving after starting lactulose and rifaximin.  She had large bowel movements last night, mental status has improved today. Appreciate GI consult  Chronic kidney disease stage IIIb. Acute kidney injury ruled out. Metabolic acidosis. Reviewed previous labs and current labs, patient renal function is relatively stable.  Patient has significant metabolic acidosis, will start sodium bicarb.  Morbid obesity with BMI 51.42. Diet exercise advised.  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 Continue to follow.  COPD. Chronic diastolic congestive heart failure. Essential hypertension. Conditions are stable, continue to follow. .      Subjective:  Patient feels better today.  She states that she has a rather large bowel movement last night, loose.  She feels much better today, she has no confusion.  She is has good appetite, she has no abdominal pain.  Physical Exam: Vitals:   12/05/22 0600 12/05/22 0800 12/05/22 0900 12/05/22 1004  BP: 113/66 107/79 123/73 (!) 92/50  Pulse: 88 88 95 95  Resp: 11 11 17 17   Temp:    97.7 F (36.5 C)  TempSrc:       SpO2: 94% 93% 91% 100%  Weight:      Height:       General exam: Appears calm and comfortable, morbidly obese. Respiratory system: Clear to auscultation. Respiratory effort normal. Cardiovascular system: S1 & S2 heard, RRR. No JVD, murmurs, rubs, gallops or clicks. No pedal edema. Gastrointestinal system: Abdomen is nondistended, soft and nontender. No organomegaly or masses felt. Normal bowel sounds heard. Central nervous system: Alert and oriented. No focal neurological deficits. Extremities: Symmetric 5 x 5 power. Skin: No rashes, lesions or ulcers Psychiatry: Judgement and insight appear normal. Mood & affect appropriate.   Data Reviewed:  Reviewed CT head, CT abdomen/pelvis results, reviewed all lab results.  Family Communication: None  Disposition: Status is: Inpatient Remains inpatient appropriate because: Severity of disease,  Planned Discharge Destination: Skilled nursing facility    Time spent: 35 minutes  Author: Sharen Hones, MD 12/05/2022 2:58 PM  For on call review [email or web address].

## 2022-12-05 NOTE — Assessment & Plan Note (Signed)
-  Statin therapy will be continued.

## 2022-12-05 NOTE — ED Notes (Signed)
Pt provided a full linen and brief change assisted by Virgilio Belling, NT- pt had large BM.

## 2022-12-05 NOTE — Assessment & Plan Note (Addendum)
-  This is in the setting of nonalcoholic hepatic cirrhosis. - The patient will be admitted to a medical telemetry bed. - We will continue p.o. lactulose 30 g 3 times daily. - We will follow ammonia level. - We will obtain PT and INR. - GI consult will be obtained. - I notified Dr. Vicente Males about the patient.

## 2022-12-05 NOTE — Progress Notes (Signed)
Chart reviewed, continue current dose of lactulose and rifaximin, no ascites to tap, no sign of infection of GI bleeding, full consult to follow tomorrow. Please reach out to me via epic chat for any questions or concerns  Sherri Sear, MD

## 2022-12-05 NOTE — Assessment & Plan Note (Signed)
Will continue CPAP nightly.

## 2022-12-06 ENCOUNTER — Ambulatory Visit: Payer: Medicare HMO | Admitting: Family

## 2022-12-06 DIAGNOSIS — I5032 Chronic diastolic (congestive) heart failure: Secondary | ICD-10-CM | POA: Diagnosis not present

## 2022-12-06 DIAGNOSIS — K7460 Unspecified cirrhosis of liver: Secondary | ICD-10-CM | POA: Diagnosis not present

## 2022-12-06 DIAGNOSIS — K7682 Hepatic encephalopathy: Secondary | ICD-10-CM | POA: Diagnosis not present

## 2022-12-06 DIAGNOSIS — K7581 Nonalcoholic steatohepatitis (NASH): Secondary | ICD-10-CM | POA: Diagnosis not present

## 2022-12-06 LAB — MAGNESIUM: Magnesium: 2.1 mg/dL (ref 1.7–2.4)

## 2022-12-06 LAB — AMMONIA: Ammonia: 46 umol/L — ABNORMAL HIGH (ref 9–35)

## 2022-12-06 LAB — BASIC METABOLIC PANEL
Anion gap: 5 (ref 5–15)
BUN: 36 mg/dL — ABNORMAL HIGH (ref 8–23)
CO2: 27 mmol/L (ref 22–32)
Calcium: 9.9 mg/dL (ref 8.9–10.3)
Chloride: 105 mmol/L (ref 98–111)
Creatinine, Ser: 1.67 mg/dL — ABNORMAL HIGH (ref 0.44–1.00)
GFR, Estimated: 33 mL/min — ABNORMAL LOW (ref >60)
Glucose, Bld: 87 mg/dL (ref 70–99)
Potassium: 4 mmol/L (ref 3.5–5.1)
Sodium: 137 mmol/L (ref 135–145)

## 2022-12-06 MED ORDER — FUROSEMIDE 40 MG PO TABS
40.0000 mg | ORAL_TABLET | Freq: Every day | ORAL | Status: DC
  Administered 2022-12-07 – 2022-12-08 (×2): 40 mg via ORAL
  Filled 2022-12-06 (×2): qty 1

## 2022-12-06 MED ORDER — SPIRONOLACTONE 25 MG PO TABS
50.0000 mg | ORAL_TABLET | Freq: Every day | ORAL | Status: DC
  Administered 2022-12-07 – 2022-12-09 (×3): 50 mg via ORAL
  Filled 2022-12-06 (×3): qty 2

## 2022-12-06 NOTE — NC FL2 (Signed)
[place] LEVEL OF CARE FORM     IDENTIFICATION  Patient Name: Carla Huerta Birthdate: [birth year] Sex: female Admission Date (Current Location): 12/04/2022  [facility] and [place] Number:  Engineering geologist and Address:         Provider Number: [id number]  Attending Physician Name and Address:  Sharen Hones, MD  Relative Name and Phone Number:  Jeneen Rinks, spouse, [phone]    Current Level of Care: Hospital Recommended Level of Care: [place] Prior Approval Number:    Date Approved/Denied:   PASRR Number: [id number]  Discharge Plan: SNF    Current Diagnoses: Patient Active Problem List   Diagnosis Date Noted   Dyslipidemia 12/05/2022   Peripheral neuropathy 12/05/2022   Obstructive sleep apnea 08/65/7846   Metabolic acidosis 96/29/5284   Thrombocytopenia, acquired ([place]) 12/05/2022   Chronic kidney disease, stage 3b ([place]) 12/05/2022   Chronic diastolic CHF (congestive heart failure) ([place]) 12/05/2022   COPD (chronic obstructive pulmonary disease) ([place]) 12/05/2022   Essential hypertension 12/05/2022   Hepatic encephalopathy ([place]) 12/04/2022   Fall at home, initial encounter 11/23/2022   Anemia of chronic disease 11/23/2022   Generalized weakness 11/23/2022   Shortness of breath 11/22/2022   Obesity, morbid, BMI 50 or higher ([place]) 11/22/2022   AKI (acute kidney injury) ([place]) 11/22/2022   Hyperlipidemia 11/22/2022   Gout 11/22/2022   Depression 11/22/2022   Sleep apnea 10/31/2022   Liver cirrhosis secondary to NASH (nonalcoholic steatohepatitis) ([place]) 10/31/2022   Acute CHF (congestive heart failure) ([place]) 10/30/2022   Acute hepatic encephalopathy (HCC) 05/04/2022   Sepsis ([place]) 05/03/2022   Acute pyelonephritis 05/03/2022   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/12/2019   Iron deficiency anemia 10/14/2016   Blood loss anemia    Hernia, hiatal    Pancytopenia (HCC) 07/25/2016   Gallstone     Orientation RESPIRATION  BLADDER Height & Weight     Self, Time, Situation, Place  Normal Continent Weight: (!) 309 lb (140.2 kg) Height:  5\' 5"  (165.1 cm)  BEHAVIORAL SYMPTOMS/MOOD NEUROLOGICAL BOWEL NUTRITION STATUS      Continent Diet (carb modified 1600-2000 cal per day)  AMBULATORY STATUS COMMUNICATION OF NEEDS Skin   Limited Assist Verbally Normal                       Personal Care Assistance Level of Assistance  Bathing, Feeding, Dressing Bathing Assistance: Limited assistance Feeding assistance: Limited assistance Dressing Assistance: Limited assistance     Functional Limitations Info  Sight, Hearing, Speech Sight Info: Adequate Hearing Info: Adequate Speech Info: Adequate    SPECIAL CARE FACTORS FREQUENCY  PT (By licensed PT), OT (By licensed OT)     PT Frequency: 5x's per week OT Frequency: 5x's per week            Contractures Contractures Info: Not present    Additional Factors Info  Code Status, Allergies Code Status Info: full Allergies Info: Latex           Current Medications (12/06/2022):  This is the current hospital active medication list Current Facility-Administered Medications  Medication Dose Route Frequency Provider Last Rate Last Admin   acetaminophen (TYLENOL) tablet 650 mg  650 mg Oral Q6H PRN Mansy, Jan A, MD       Or   acetaminophen (TYLENOL) suppository 650 mg  650 mg Rectal Q6H PRN Mansy, Jan A, MD       albuterol (PROVENTIL) (2.5 MG/3ML) 0.083% nebulizer solution 3 mL  3 mL Inhalation Q4H PRN Mansy, Jan A, MD       allopurinol (ZYLOPRIM) tablet 100 mg  100 mg Oral Daily Mansy, Jan A, MD   100 mg at 12/06/22 0949   aspirin chewable tablet 81 mg  81 mg Oral QHS Mansy, Jan A, MD   81 mg at 12/05/22 2106   atorvastatin (LIPITOR) tablet 40 mg  40 mg Oral Daily Mansy, Jan A, MD   40 mg at 12/06/22 0949   cholecalciferol (VITAMIN D3) 25 MCG (1000 UNIT) tablet 1,000 Units  1,000 Units Oral Daily Mansy, Jan A, MD   1,000 Units at 12/06/22 0949   enoxaparin  (LOVENOX) injection 70 mg  0.5 mg/kg Subcutaneous Q24H Mansy, Jan A, MD   70 mg at 12/05/22 2106   ferrous sulfate tablet 325 mg  325 mg Oral Q breakfast Mansy, Jan A, MD   325 mg at 12/06/22 0949   fesoterodine (TOVIAZ) tablet 4 mg  4 mg Oral Daily Mansy, Jan A, MD   4 mg at 12/06/22 0949   [START ON 12/07/2022] furosemide (LASIX) tablet 40 mg  40 mg Oral Daily Sharen Hones, MD       gabapentin (NEURONTIN) capsule 600 mg  600 mg Oral QHS Mansy, Jan A, MD   600 mg at 12/05/22 2105   imipramine (TOFRANIL) tablet 100 mg  100 mg Oral QHS Mansy, Jan A, MD   100 mg at 12/05/22 2106   lactulose (CHRONULAC) 10 GM/15ML solution 30 g  30 g Oral TID Mansy, Jan A, MD   30 g at 12/06/22 0949   magnesium hydroxide (MILK OF MAGNESIA) suspension 30 mL  30 mL Oral Daily PRN Mansy, Jan A, MD       ondansetron [facility]) tablet 4 mg  4 mg Oral Q6H PRN Mansy, Jan A, MD       Or   ondansetron [facility]) injection 4 mg  4 mg Intravenous Q6H PRN Mansy, Jan A, MD       pantoprazole (PROTONIX) EC tablet 40 mg  40 mg Oral Daily Mansy, Jan A, MD   40 mg at 12/06/22 0949   rifaximin (XIFAXAN) tablet 550 mg  550 mg Oral BID Sharen Hones, MD   550 mg at 12/06/22 0949   [START ON 12/07/2022] spironolactone (ALDACTONE) tablet 50 mg  50 mg Oral Daily Sharen Hones, MD       traZODone (DESYREL) tablet 25 mg  25 mg Oral QHS PRN Mansy, Arvella Merles, MD         Discharge Medications: Please see discharge summary for a list of discharge medications.  Relevant Imaging Results:  Relevant Lab Results:   Additional Information ss#: [id number]  Judeen Hammans Ayianna Darnold, LCSW

## 2022-12-06 NOTE — Progress Notes (Signed)
  Progress Note   Patient: Carla Huerta HLK:[id number] DOB: 1951-01-13 DOA: 12/04/2022     2 DOS: the patient was seen and examined on 12/06/2022   Brief hospital course: Carla Huerta is a 72 y.o. female with medical history significant for COPD, CHF, depression, GERD, hypertension and known alcoholic hepatosteatosis, as well as stage III chronic kidney disease, who presented to the emergency room with acute onset of vomiting since this morning at her SNF.  Upon arriving the hospital, was very confused, vital signs are stable, no leukocytosis.  She had a profound elevation of ammonia level, was diagnosed with hepatic encephalopathy.  Given fluids, lactulose and rifaximin.    Assessment and Plan: Hepatic encephalopathy. 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 Condition much improved, mental status back to baseline.  Patient has daily bowel movement, will continue lactulose and rifaximin. Patient can be transferred back to nursing home, however, insurance company requested to have PT/OT reeval, and then apply for recertification.   Chronic kidney disease stage IIIb. Acute kidney injury ruled out. Metabolic acidosis. Metabolic acidosis improved, discontinue sodium bicarbonate.  Renal function stable.   Morbid obesity with BMI 51.42. Diet exercise advised.   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 Continue to follow.   COPD. Chronic diastolic congestive heart failure. Essential hypertension. Conditions are stable, continue to follow. Restart diuretics at home dose.      Subjective:  Patient condition improved, currently no confusion.  Physical Exam: Vitals:   12/05/22 1004 12/05/22 1737 12/05/22 2309 12/06/22 0748  BP: (!) 92/50 118/66 (!) 97/55 (!) 122/56  Pulse: 95 87 88 83  Resp: 17 18 17 18   Temp: 97.7 F (36.5 C) 98.1 F (36.7 C) 97.9 F (36.6 C) 98.1 F (36.7 C)  TempSrc:      SpO2: 100% 97% 94% 96%  Weight:       Height:       General exam: Appears calm and comfortable, morbid obese. Respiratory system: Clear to auscultation. Respiratory effort normal. Cardiovascular system: S1 & S2 heard, RRR. No JVD, murmurs, rubs, gallops or clicks. No pedal edema. Gastrointestinal system: Abdomen is nondistended, soft and nontender. No organomegaly or masses felt. Normal bowel sounds heard. Central nervous system: Alert and oriented. No focal neurological deficits. Extremities: Symmetric 5 x 5 power. Skin: No rashes, lesions or ulcers Psychiatry: Judgement and insight appear normal. Mood & affect appropriate.   Data Reviewed:  Lab results reviewed.  Family Communication: Daughter updated at bedside.  Disposition: Status is: Inpatient Remains inpatient appropriate because: Pending insurance certification for SNF placement.  Planned Discharge Destination: Skilled nursing facility    Time spent: 35 minutes  Author: Sharen Hones, MD 12/06/2022 11:19 AM  For on call review [email or web address].

## 2022-12-06 NOTE — Evaluation (Signed)
Occupational Therapy Evaluation Patient Details Name: Carla Huerta MRN: [id number] DOB: 06-Aug-1951 Today's Date: 12/06/2022   History of Present Illness Patient is a 72 year old female presenting with acute onset of vomiting from SNF. Found to have hepatic encephalopathy.   Clinical Impression   Carla Huerta was seen for OT evaluation this date. Prior to hospital admission, pt was recently d/c to STR, prior to which pt was MOD I. Pt lives with family. Pt presents to acute OT demonstrating impaired ADL performance and functional mobility 2/2 decreased activity tolerance and functional strength/ROM/balance deficits. Pt currently requires MIN A + RW for toielt t/f (urinary incontinence standing). SETUP don/doff gown seated. MAX A don B socks seated. Pt would benefit from skilled OT to address noted impairments and functional limitations (see below for any additional details). Upon hospital discharge, recommend STR to maximize pt safety and return to PLOF.   Recommendations for follow up therapy are one component of a multi-disciplinary discharge planning process, led by the attending physician.  Recommendations may be updated based on patient status, additional functional criteria and insurance authorization.   Follow Up Recommendations  Skilled nursing-short term rehab (<3 hours/day)     Assistance Recommended at Discharge Intermittent Supervision/Assistance  Patient can return home with the following A little help with walking and/or transfers;Assistance with cooking/housework;Assist for transportation;Help with stairs or ramp for entrance;A little help with bathing/dressing/bathroom;Direct supervision/assist for medications management    Functional Status Assessment  Patient has had a recent decline in their functional status and demonstrates the ability to make significant improvements in function in a reasonable and predictable amount of time.  Equipment Recommendations  BSC/3in1     Recommendations for Other Services       Precautions / Restrictions Precautions Precautions: Fall Restrictions Weight Bearing Restrictions: No      Mobility Bed Mobility Overal bed mobility: Needs Assistance Bed Mobility: Supine to Sit     Supine to sit: Min assist     General bed mobility comments: assistance for trunk support    Transfers Overall transfer level: Needs assistance Equipment used: Rolling walker (2 wheels) Transfers: Sit to/from Stand Sit to Stand: Min assist           General transfer comment: lifting assistance required for standing from bed and from toilet. verbal cues for technique, hand placement for safety with carry over demonstrated      Balance Overall balance assessment: Needs assistance Sitting-balance support: No upper extremity supported Sitting balance-Carla Huerta Scale: Good     Standing balance support: Single extremity supported Standing balance-Carla Huerta Scale: Fair Standing balance comment: with rolling walker for support                           ADL either performed or assessed with clinical judgement   ADL Overall ADL's : Needs assistance/impaired                                       General ADL Comments: MIN A + RW for toielt t/f (urinary incontinence standing). SETUP don/doff gown seated. MAX A don B socks seated.      Pertinent Vitals/Pain Pain Assessment Pain Assessment: No/denies pain     Hand Dominance     Extremity/Trunk Assessment Upper Extremity Assessment Upper Extremity Assessment: Generalized weakness   Lower Extremity Assessment Lower Extremity Assessment: Generalized weakness  Communication Communication Communication: No difficulties   Cognition Arousal/Alertness: Awake/alert Behavior During Therapy: WFL for tasks assessed/performed Overall Cognitive Status: Within Functional Limits for tasks assessed                                 General  Comments: patient is alert, awake, following commands consistently.     General Comments  urine incontinence with ambulation    Exercises     Shoulder Instructions      Home Living Family/patient expects to be discharged to:: Private residence Living Arrangements: Spouse/significant other Available Help at Discharge: Family;Available 24 hours/day Type of Home: House Home Access: Ramped entrance     Home Layout: One level     Bathroom Shower/Tub: Teacher, early years/pre: Standard     Home Equipment: Rollator (4 wheels);Tub bench   Additional Comments: recently discharged to SNF for short term rehab but reports not starting therapy yet.      Prior Functioning/Environment Prior Level of Function : History of Falls (last six months);Needs assist             Mobility Comments: assistance required, rollator for ambulation prior to acute medical issues ADLs Comments: assistance required        OT Problem List: Decreased strength;Decreased activity tolerance;Impaired balance (sitting and/or standing);Obesity;Cardiopulmonary status limiting activity;Decreased safety awareness      OT Treatment/Interventions: Self-care/ADL training;Therapeutic exercise;Therapeutic activities;Energy conservation;DME and/or AE instruction;Patient/family education;Balance training;Cognitive remediation/compensation    OT Goals(Current goals can be found in the care plan section) Acute Rehab OT Goals Patient Stated Goal: go home OT Goal Formulation: With patient Time For Goal Achievement: 12/20/22 Potential to Achieve Goals: Good  OT Frequency: Min 2X/week    Co-evaluation PT/OT/SLP Co-Evaluation/Treatment: Yes Reason for Co-Treatment: For patient/therapist safety PT goals addressed during session: Mobility/safety with mobility OT goals addressed during session: ADL's and self-care      AM-PAC OT "6 Clicks" Daily Activity     Outcome Measure Help from another person  eating meals?: None Help from another person taking care of personal grooming?: A Little Help from another person toileting, which includes using toliet, bedpan, or urinal?: A Lot Help from another person bathing (including washing, rinsing, drying)?: A Lot Help from another person to put on and taking off regular upper body clothing?: A Little Help from another person to put on and taking off regular lower body clothing?: A Lot 6 Click Score: 16   End of Session    Activity Tolerance: Patient tolerated treatment well Patient left: in chair;with call bell/phone within reach;with chair alarm set  OT Visit Diagnosis: Unsteadiness on feet (R26.81);History of falling (Z91.81);Muscle weakness (generalized) (M62.81)                Time: 5638-7564 OT Time Calculation (min): 21 min Charges:  OT General Charges $OT Visit: 1 Visit OT Evaluation $OT Eval Moderate Complexity: 1 Mod OT Treatments $Self Care/Home Management : 8-22 mins  Dessie Coma, M.S. OTR/L  12/06/22, 12:52 PM  ascom [phone]

## 2022-12-06 NOTE — Progress Notes (Signed)
CSW phoned pt's dtr, Kenney Houseman, to discuss discharge plans for pt.  CSW explained that pt has to be re-evaluated by PT/OT and TOC will wait for recommendations.  If the rec is for pt to go bk to a SNF, TOC will start process over again for finding a facility for pt.  Kenney Houseman stated that she understood and had no other questions for CSW.

## 2022-12-06 NOTE — TOC Initial Note (Signed)
Transition of Care [facility]) - Initial/Assessment Note    Patient Details  Name: Carla Huerta MRN: [id number] Date of Birth: Dec 06, 1950  [facility] CM/SW Contact:    Quin Hoop, LCSW Phone Number: 12/06/2022, 8:46 AM  Clinical Narrative:                 Patient readmit from [facility].  She was living with her family in single family home prior to previous admission. CSW waiting for recs from PT/OT for discharge planning needs.  Expected Discharge Plan:  (to be determined after PT/OT eval) Barriers to Discharge: Continued Medical Work up   Patient Goals and CMS Choice            Expected Discharge Plan and Services    To be determined after PT/OT evaluation.                                          Prior Living Arrangements/Services   Lives with:: Self, Adult Children                Criminal Activity/Legal Involvement Pertinent to Current Situation/Hospitalization: No - Comment as needed  Activities of Daily Living      Permission Sought/Granted                  Emotional Assessment         Alcohol / Substance Use: Not Applicable Psych Involvement: No (comment)  Admission diagnosis:  Hepatic encephalopathy ([place]) [K76.82] Hyperammonemia ([place]) [E72.20] Patient Active Problem List   Diagnosis Date Noted   Dyslipidemia 12/05/2022   Peripheral neuropathy 12/05/2022   Obstructive sleep apnea 36/64/4034   Metabolic acidosis 74/25/9563   Thrombocytopenia, acquired ([place]) 12/05/2022   Chronic kidney disease, stage 3b ([place]) 12/05/2022   Chronic diastolic CHF (congestive heart failure) ([place]) 12/05/2022   COPD (chronic obstructive pulmonary disease) ([place]) 12/05/2022   Essential hypertension 12/05/2022   Hepatic encephalopathy ([place]) 12/04/2022   Fall at home, initial encounter 11/23/2022   Anemia of chronic disease 11/23/2022   Generalized weakness 11/23/2022   Shortness of breath 11/22/2022   Obesity, morbid, BMI 50 or  higher ([place]) 11/22/2022   AKI (acute kidney injury) ([place]) 11/22/2022   Hyperlipidemia 11/22/2022   Gout 11/22/2022   Depression 11/22/2022   Sleep apnea 10/31/2022   Liver cirrhosis secondary to NASH (nonalcoholic steatohepatitis) ([place]) 10/31/2022   Acute CHF (congestive heart failure) ([place]) 10/30/2022   Acute hepatic encephalopathy ([place]) 05/04/2022   Sepsis ([place]) 05/03/2022   Acute pyelonephritis 05/03/2022   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/12/2019   Iron deficiency anemia 10/14/2016   Blood loss anemia    Hernia, hiatal    Pancytopenia ([place]) 07/25/2016   Gallstone    PCP:  Denton Lank, MD Pharmacy:   [facility] [phone] [address], [place] AT [facility] [address] [place] [address] Phone: [phone] Fax: [phone]     Social Determinants of Health (SDOH) Social History: SDOH Screenings   Food Insecurity: No Food Insecurity (11/24/2022)  Housing: Low Risk  (11/24/2022)  Transportation Needs: No Transportation Needs (11/24/2022)  Utilities: Not At Risk (11/24/2022)  Tobacco Use: High Risk (12/04/2022)   SDOH Interventions:     Readmission Risk Interventions     No data to display

## 2022-12-06 NOTE — Care Management Important Message (Signed)
Important Message  Patient Details  Name: Carla Huerta MRN: [id number] Date of Birth: 1950/11/16   Medicare Important Message Given:  N/A - LOS <3 / Initial given by admissions     Juliann Pulse A Staley Budzinski 12/06/2022, 9:08 AM

## 2022-12-06 NOTE — Evaluation (Signed)
Physical Therapy Evaluation Patient Details Name: Carla Huerta MRN: [id number] DOB: 06-Nov-1951 Today's Date: 12/06/2022  History of Present Illness  Patient is a 72 year old female presenting with acute onset of vomiting from SNF. Found to have hepatic encephalopathy.  Clinical Impression  Patient is agreeable to PT evaluation. She reports she uses a rollator at baseline and has history of multiple falls. She was recently discharged to SNF for short term rehab but reports she has not started therapy yet.  Today, the patient required physical assistance with mobility. Min A for bed mobility, standing, and for ambulation. Steadying assistance required while ambulating with rolling walker with activity tolerance limited by fatigue and dizziness. The patient does not appear to be at her baseline level of functional mobility and could benefit from short term rehab at SNF to facilitate independence and decrease risk for falls. PT will follow while in the hospital.      Recommendations for follow up therapy are one component of a multi-disciplinary discharge planning process, led by the attending physician.  Recommendations may be updated based on patient status, additional functional criteria and insurance authorization.  Follow Up Recommendations Skilled nursing-short term rehab (<3 hours/day) Can patient physically be transported by private vehicle: No    Assistance Recommended at Discharge Frequent or constant Supervision/Assistance  Patient can return home with the following  A lot of help with walking and/or transfers;A lot of help with bathing/dressing/bathroom;Help with stairs or ramp for entrance    Equipment Recommendations None recommended by PT  Recommendations for Other Services       Functional Status Assessment Patient has had a recent decline in their functional status and demonstrates the ability to make significant improvements in function in a reasonable and predictable  amount of time.     Precautions / Restrictions Precautions Precautions: Fall Restrictions Weight Bearing Restrictions: No      Mobility  Bed Mobility Overal bed mobility: Needs Assistance Bed Mobility: Supine to Sit     Supine to sit: Min assist     General bed mobility comments: assistance for trunk support    Transfers Overall transfer level: Needs assistance Equipment used: Rolling walker (2 wheels) Transfers: Sit to/from Stand Sit to Stand: Min assist           General transfer comment: lifting assistance required for standing from bed and from toilet. verbal cues for technique, hand placement for safety with carry over demonstrated    Ambulation/Gait Ambulation/Gait assistance: Min assist Gait Distance (Feet): 45 Feet Assistive device: Rolling walker (2 wheels) Gait Pattern/deviations: Step-through pattern, Trunk flexed, Drifts right/left Gait velocity: decreased     General Gait Details: patient required steadying assistance with ambulation using rolling walker with cues for safety. patient is fatigued with activity with dizziness and mild shortness of breath with exertion. heart rate in the 100-110's with activity. further ambulation deferred for safety at this time  Stairs            Wheelchair Mobility    Modified Rankin (Stroke Patients Only)       Balance Overall balance assessment: Needs assistance Sitting-balance support: No upper extremity supported Sitting balance-Leahy Scale: Good     Standing balance support: Single extremity supported Standing balance-Leahy Scale: Fair Standing balance comment: with rolling walker for support                             Pertinent Vitals/Pain Pain Assessment Pain Assessment: No/denies  pain    Home Living Family/patient expects to be discharged to:: Private residence Living Arrangements: Spouse/significant other Available Help at Discharge: Family;Available 24 hours/day Type of  Home: House Home Access: Ramped entrance       Home Layout: One level Home Equipment: Rollator (4 wheels);Tub bench Additional Comments: recently discharged to SNF for short term rehab but reports not starting therapy yet.    Prior Function Prior Level of Function : History of Falls (last six months);Needs assist             Mobility Comments: assistance required, rollator for ambulation prior to acute medical issues ADLs Comments: assistance required     Hand Dominance        Extremity/Trunk Assessment   Upper Extremity Assessment Upper Extremity Assessment: Generalized weakness    Lower Extremity Assessment Lower Extremity Assessment: Generalized weakness       Communication   Communication: No difficulties  Cognition Arousal/Alertness: Awake/alert Behavior During Therapy: WFL for tasks assessed/performed Overall Cognitive Status: Within Functional Limits for tasks assessed                                 General Comments: patient is alert, awake, following commands consistently.        General Comments General comments (skin integrity, edema, etc.): urine incontinence with ambulation    Exercises     Assessment/Plan    PT Assessment Patient needs continued PT services  PT Problem List Decreased strength;Decreased activity tolerance;Decreased balance;Decreased mobility;Decreased safety awareness       PT Treatment Interventions DME instruction;Gait training;Stair training;Functional mobility training;Therapeutic activities;Therapeutic exercise;Balance training;Neuromuscular re-education;Cognitive remediation    PT Goals (Current goals can be found in the Care Plan section)  Acute Rehab PT Goals Patient Stated Goal: to get back to rehab PT Goal Formulation: With patient Time For Goal Achievement: 12/20/22 Potential to Achieve Goals: Good    Frequency Min 2X/week     Co-evaluation PT/OT/SLP Co-Evaluation/Treatment: Yes Reason for  Co-Treatment: For patient/therapist safety;To address functional/ADL transfers PT goals addressed during session: Mobility/safety with mobility         AM-PAC PT "6 Clicks" Mobility  Outcome Measure Help needed turning from your back to your side while in a flat bed without using bedrails?: A Little Help needed moving from lying on your back to sitting on the side of a flat bed without using bedrails?: A Lot Help needed moving to and from a bed to a chair (including a wheelchair)?: A Little Help needed standing up from a chair using your arms (e.g., wheelchair or bedside chair)?: A Little Help needed to walk in hospital room?: A Little Help needed climbing 3-5 steps with a railing? : Total 6 Click Score: 15    End of Session   Activity Tolerance: Patient limited by fatigue;Patient tolerated treatment well Patient left: in chair;with call bell/phone within reach;with chair alarm set   PT Visit Diagnosis: Unsteadiness on feet (R26.81);Muscle weakness (generalized) (M62.81)    Time: 9518-8416 PT Time Calculation (min) (ACUTE ONLY): 21 min   Charges:   PT Evaluation $PT Eval Low Complexity: 1 Low PT Treatments $Therapeutic Activity: 8-22 mins       Minna Merritts, PT, MPT   Percell Locus 12/06/2022, 11:55 AM

## 2022-12-06 NOTE — Consult Note (Signed)
Arlyss Repress, MD [address]  [address]  [place], [place] [address]  Main: [phone]  Fax: [phone] Pager: [phone]   Consultation  Referring Provider:     No ref. provider found Primary Care Physician:  Hillery Aldo, MD Primary Gastroenterologist: Gentry Fitz        Reason for Consultation: Cirrhosis of liver, hepatic encephalopathy  Date of Admission:  12/04/2022 Date of Consultation:  12/06/2022         HPI:   Carla Huerta is a 72 y.o. female with metabolic syndrome, history of COPD, CHF, chronic GERD, history of fatty liver disease, CKD admitted on 1/24 from SNF secondary to altered mental status, nausea and vomiting.  Patient denied any chest pain or abdominal pain, coffee-ground emesis or hematemesis, melena or rectal bleeding.  She had generalized weakness over the last several days, has trouble using the bathroom, reports constipation.  She has chronic dyspnea at baseline.  In the ER, she is found to have mildly elevated BUN/creatinine worse from baseline, LFTs revealed elevated AST 94, ALT normal, alkaline phosphatase normal, T. bili 1.1, CBC significant for thrombocytopenia and normocytic anemia.  Apparently, patient has new onset of thrombocytopenia since 04/2022.  She is found to have nodularity of the liver based on the CT scan in 04/2022, no evidence of portal vein thrombosis or portal hypertension.  She does have cholelithiasis.  Repeat imaging on this admission revealed cholelithiasis without evidence of cholecystitis, cirrhosis of liver, no evidence of ascites or free fluid.  Patient is started on lactulose and rifaximin, she had a big bowel movement since admission and her mental status has significantly improved.  She is currently not altered, sitting up in chair, answers all my questions.  She denied any abdominal pain.  She knows that she has fatty liver but is not aware of cirrhosis of liver. She has been hemodynamically stable, with no evidence of  infection  NSAIDs: None  Antiplts/Anticoagulants/Anti thrombotics: None  GI Procedures: Upper endoscopy and colonoscopy 10/01/2016 Medium size hiatal hernia, normal stomach and duodenum, normal esophagus Excellent bowel prep Normal colon and normal retroflexion   Past Medical History:  Diagnosis Date   Anemia    CHF (congestive heart failure) (HCC)    COPD (chronic obstructive pulmonary disease) (HCC)    Cough    Depression    Diaphragm paralysis    Gout    Hypertension    Hyponatremia    Insomnia    Liver disease    Nonalcoholic hepatosteatosis    PONV (postoperative nausea and vomiting)    Renal disorder    Sleep apnea    Splenic laceration    Vertigo     Past Surgical History:  Procedure Laterality Date   APPENDECTOMY     when she was 38 yearsa old   BREAST BIOPSY Left 06/17/2019   affirm bx distortion with calcs, coil clip, ADH   BREAST EXCISIONAL BIOPSY Left 09/23/2019   neg   BREAST LUMPECTOMY Left 09/23/2019   ADH   BREAST LUMPECTOMY WITH NEEDLE LOCALIZATION Left 09/23/2019   Procedure: LEFT BREAST LUMPECTOMY WITH NEEDLE LOCALIZATION;  Surgeon: Griselda Miner, MD;  Location: [facility];  Service: General;  Laterality: Left;   COLONOSCOPY WITH PROPOFOL N/A 10/01/2016   Procedure: COLONOSCOPY WITH PROPOFOL;  Surgeon: Midge Minium, MD;  Location: [facility];  Service: Endoscopy;  Laterality: N/A;   ESOPHAGOGASTRODUODENOSCOPY (EGD) WITH PROPOFOL N/A 10/01/2016   Procedure: ESOPHAGOGASTRODUODENOSCOPY (EGD) WITH PROPOFOL;  Surgeon: Midge Minium, MD;  Location: [facility] END[facility];  Service: Endoscopy;  Laterality: N/A;   LIVER BIOPSY     2015   REPLACEMENT TOTAL KNEE Right 2002   TONSILLECTOMY AND ADENOIDECTOMY     when pt was 72 years old     Current Facility-Administered Medications:    acetaminophen (TYLENOL) tablet 650 mg, 650 mg, Oral, Q6H PRN **OR** acetaminophen (TYLENOL) suppository 650 mg, 650 mg, Rectal, Q6H PRN, Mansy, Jan A, MD   albuterol  (PROVENTIL) (2.5 MG/3ML) 0.083% nebulizer solution 3 mL, 3 mL, Inhalation, Q4H PRN, Mansy, Jan A, MD   allopurinol (ZYLOPRIM) tablet 100 mg, 100 mg, Oral, Daily, Mansy, Jan A, MD, 100 mg at 12/06/22 9924   aspirin chewable tablet 81 mg, 81 mg, Oral, QHS, Mansy, Jan A, MD, 81 mg at 12/05/22 2106   atorvastatin (LIPITOR) tablet 40 mg, 40 mg, Oral, Daily, Mansy, Jan A, MD, 40 mg at 12/06/22 2683   cholecalciferol (VITAMIN D3) 25 MCG (1000 UNIT) tablet 1,000 Units, 1,000 Units, Oral, Daily, Mansy, Jan A, MD, 1,000 Units at 12/06/22 0949   enoxaparin (LOVENOX) injection 70 mg, 0.5 mg/kg, Subcutaneous, Q24H, Mansy, Jan A, MD, 70 mg at 12/05/22 2106   ferrous sulfate tablet 325 mg, 325 mg, Oral, Q breakfast, Mansy, Jan A, MD, 325 mg at 12/06/22 0949   fesoterodine (TOVIAZ) tablet 4 mg, 4 mg, Oral, Daily, Mansy, Jan A, MD, 4 mg at 12/06/22 0949   [START ON 12/07/2022] furosemide (LASIX) tablet 40 mg, 40 mg, Oral, Daily, Sharen Hones, MD   gabapentin (NEURONTIN) capsule 600 mg, 600 mg, Oral, QHS, Mansy, Jan A, MD, 600 mg at 12/05/22 2105   imipramine (TOFRANIL) tablet 100 mg, 100 mg, Oral, QHS, Mansy, Jan A, MD, 100 mg at 12/05/22 2106   lactulose (CHRONULAC) 10 GM/15ML solution 30 g, 30 g, Oral, TID, Mansy, Jan A, MD, 30 g at 12/06/22 4196   magnesium hydroxide (MILK OF MAGNESIA) suspension 30 mL, 30 mL, Oral, Daily PRN, Mansy, Jan A, MD   ondansetron (ZOFRAN) tablet 4 mg, 4 mg, Oral, Q6H PRN **OR** ondansetron (ZOFRAN) injection 4 mg, 4 mg, Intravenous, Q6H PRN, Mansy, Jan A, MD   pantoprazole (PROTONIX) EC tablet 40 mg, 40 mg, Oral, Daily, Mansy, Jan A, MD, 40 mg at 12/06/22 0949   rifaximin (XIFAXAN) tablet 550 mg, 550 mg, Oral, BID, Sharen Hones, MD, 550 mg at 12/06/22 0949   [START ON 12/07/2022] spironolactone (ALDACTONE) tablet 50 mg, 50 mg, Oral, Daily, Sharen Hones, MD   traZODone (DESYREL) tablet 25 mg, 25 mg, Oral, QHS PRN, Mansy, Arvella Merles, MD   Family History  Problem Relation Age of Onset    Breast cancer Sister    Breast cancer Paternal Grandmother    Breast cancer Other      Social History   Tobacco Use   Smoking status: Former    Years: 1.00    Types: Cigarettes   Smokeless tobacco: Current  Vaping Use   Vaping Use: Never used  Substance Use Topics   Alcohol use: No   Drug use: No    Allergies as of 12/04/2022 - Review Complete 12/04/2022  Allergen Reaction Noted   Latex Rash 12/04/2022    Review of Systems:    All systems reviewed and negative except where noted in HPI.   Physical Exam:  Vital signs in last 24 hours: Temp:  [97.9 F (36.6 C)-98.1 F (36.7 C)] 98.1 F (36.7 C) (01/26 0748) Pulse Rate:  [83-88] 83 (01/26 0748) Resp:  [17-18] 18 (01/26  2426) BP: (97-122)/(55-66) 122/56 (01/26 0748) SpO2:  [94 %-97 %] 96 % (01/26 0748) Last BM Date : 12/05/22 General:   Pleasant, cooperative in NAD Head:  Normocephalic and atraumatic. Eyes:   No icterus.   Conjunctiva pink. PERRLA. Ears:  Normal auditory acuity. Neck:  Supple; no masses or thyroidomegaly Lungs: Respirations even and unlabored. Lungs clear to auscultation bilaterally.   No wheezes, crackles, or rhonchi.  Heart:  Regular rate and rhythm;  Without murmur, clicks, rubs or gallops Abdomen:  Soft, nondistended, nontender. Normal bowel sounds. No appreciable masses or hepatomegaly.  No rebound or guarding.  Rectal:  Not performed. Msk:  Symmetrical without gross deformities.  Strength generalized weakness Extremities:  Without edema, cyanosis or clubbing. Neurologic:  Alert and oriented x3;  grossly normal neurologically. Skin:  Intact without significant lesions or rashes. Psych:  Alert and cooperative. Normal affect.  LAB RESULTS:    Latest Ref Rng & Units 12/05/2022    5:20 AM 12/04/2022   12:16 PM 12/01/2022   11:12 AM  CBC  WBC 4.0 - 10.5 K/uL 4.4  4.6  4.9   Hemoglobin 12.0 - 15.0 g/dL 10.8  11.6  12.4   Hematocrit 36.0 - 46.0 % 35.6  37.6  41.8   Platelets 150 - 400 K/uL 122   119  145     BMET    Latest Ref Rng & Units 12/06/2022    4:21 AM 12/05/2022    5:20 AM 12/04/2022   12:16 PM  BMP  Glucose 70 - 99 mg/dL 87  80  112   BUN 8 - 23 mg/dL 36  36  40   Creatinine 0.44 - 1.00 mg/dL 1.67  1.69  1.84   Sodium 135 - 145 mmol/L 137  138  136   Potassium 3.5 - 5.1 mmol/L 4.0  4.0  4.3   Chloride 98 - 111 mmol/L 105  106  100   CO2 22 - 32 mmol/L 27  18  21    Calcium 8.9 - 10.3 mg/dL 9.9  9.2  9.8     LFT    Latest Ref Rng & Units 12/05/2022    5:20 AM 12/04/2022   12:16 PM 12/01/2022   11:12 AM  Hepatic Function  Total Protein 6.5 - 8.1 g/dL 6.8  7.6  8.1   Albumin 3.5 - 5.0 g/dL 3.3  3.7  4.0   AST 15 - 41 U/L 81  94  76   ALT 0 - 44 U/L 33  38  41   Alk Phosphatase 38 - 126 U/L 75  88  91   Total Bilirubin 0.3 - 1.2 mg/dL 1.0  1.1  1.0      STUDIES: CT ABDOMEN PELVIS WO CONTRAST  Result Date: 12/04/2022 CLINICAL DATA:  Abdominal pain.  Acute nonlocalized EXAM: CT ABDOMEN AND PELVIS WITHOUT CONTRAST TECHNIQUE: Multidetector CT imaging of the abdomen and pelvis was performed following the standard protocol without IV contrast. RADIATION DOSE REDUCTION: This exam was performed according to the departmental dose-optimization program which includes automated exposure control, adjustment of the mA and/or kV according to patient size and/or use of iterative reconstruction technique. COMPARISON:  None Available. FINDINGS: Lower chest: Lung bases are clear. Hepatobiliary: Liver has a nodular contour. Several gallstones noted in the neck of the gallbladder. No gallbladder distension. Common bile duct normal. Pancreas: Pancreas is normal. No ductal dilatation. No pancreatic inflammation. Spleen: Calcifications along the capsule of the spleen suggest prior trauma or infection. Acute findings.  Adrenals/urinary tract: Adrenal glands and kidneys are normal. The ureters and bladder normal. Stomach/Bowel: The stomach, duodenum, and small bowel normal. The colon and  rectosigmoid colon are normal. Vascular/Lymphatic: Abdominal aorta is normal caliber with atherosclerotic calcification. There is no retroperitoneal or periportal lymphadenopathy. No pelvic lymphadenopathy. Reproductive: Uterus and adnexa unremarkable. Other: No free fluid. Musculoskeletal: No aggressive osseous lesion. IMPRESSION: 1. No acute findings in the abdomen pelvis. 2. Cholelithiasis without evidence cholecystitis. 3. Nodular contour of the liver suggests cirrhosis. 4. Aortic atherosclerosis. Aortic Atherosclerosis (ICD10-I70.0). Electronically Signed   By: Suzy Bouchard M.D.   On: 12/04/2022 17:54   CT HEAD WO CONTRAST (5MM)  Result Date: 12/04/2022 CLINICAL DATA:  Confused.  Vomiting. EXAM: CT HEAD WITHOUT CONTRAST TECHNIQUE: Contiguous axial images were obtained from the base of the skull through the vertex without intravenous contrast. RADIATION DOSE REDUCTION: This exam was performed according to the departmental dose-optimization program which includes automated exposure control, adjustment of the mA and/or kV according to patient size and/or use of iterative reconstruction technique. COMPARISON:  CT head 12/01/2022 FINDINGS: Brain: There is no acute intracranial hemorrhage, extra-axial fluid collection, or acute infarct Parenchymal volume is normal for age. The ventricles are normal in size. Gray-white differentiation is preserved. Patchy hypodensity in the supratentorial white matter likely reflects sequela of chronic small-vessel ischemic change The pituitary and suprasellar region are normal. There is no mass lesion. There is no mass effect or midline shift. Vascular: No hyperdense vessel or unexpected calcification. Skull: Normal. Negative for fracture or focal lesion. Sinuses/Orbits: The paranasal sinuses are clear. The globes and orbits are unremarkable. Other: None. IMPRESSION: Stable noncontrast head CT with no acute intracranial pathology. Electronically Signed   By: Valetta Mole M.D.    On: 12/04/2022 17:46      Impression / Plan:   Carla Huerta is a 72 y.o. female with morbid obesity, COPD, history of fatty liver, diagnosed with cirrhosis of liver in June 2023 now presented with altered mental status  Altered mental status: Currently resolved No signs of infection or drug overdose CT head did not reveal any intracranial pathology Most likely her altered mental status is secondary to hepatic encephalopathy from underlying cirrhosis of liver Patient responded well to lactulose and rifaximin, moving her bowels Continue the same, long-term  Cirrhosis of liver, most likely secondary to underlying Nash Decompensated with hepatic encephalopathy Portal hypertension manifested as thrombocytopenia Patient will need EGD for variceal screening as outpatient No evidence of ascites, recommend low-sodium diet HRS: None HCC screening: No evidence of liver lesions based on cross-sectional imaging HE: Treated as above Recommend follow-up with GI upon discharge for long-term management of cirrhosis of liver  Thank you for involving me in the care of this patient.  GI will sign off at this time, please call us back with questions or concerns    LOS: 2 days   Sherri Sear, MD  12/06/2022, 2:47 PM    Note: This dictation was prepared with Dragon dictation along with smaller phrase technology. Any transcriptional errors that result from this process are unintentional.

## 2022-12-06 NOTE — TOC Transition Note (Signed)
Transition of Care [facility]) - CM/SW Discharge Note   Patient Details  Name: Carla Huerta MRN: [id number] Date of Birth: Sep 12, 1951  [facility] CM/SW Contact:  Quin Hoop, LCSW Phone Number: 12/06/2022, 3:39 PM   Clinical Narrative:    Carla Huerta is a 72 y.o. female  here with ams, weakness. Pt was actually just discharged to facility ([facility]) on 1/23. Reports that she has been increasingly weak over the last several days and has not been having regular BMs. Pt states that she had been increasingly confused, weak, and nauseous. She has had nausea, vomiting as well. Unable to eat anything today. No fevers, chills. No urinary sx.   CSW had discussion with patient and her daughter, Carla Huerta, about SNF recommendation.  Pt is amenable to SNF.  CSW started bed searches and completed FL2.  Once bed offers presented, [facility] Kaufmann will be started.  Task to weekend SW to follow-up with bed offers and begin auth.       Barriers to Discharge: SNF Pending bed offer   Patient Goals and CMS Choice  Pending bed offers    Discharge Placement    SNF                     Discharge Plan and Services Additional resources added to the After Visit Summary for                                       Social Determinants of Health (SDOH) Interventions SDOH Screenings   Food Insecurity: No Food Insecurity (11/24/2022)  Housing: Low Risk  (11/24/2022)  Transportation Needs: No Transportation Needs (11/24/2022)  Utilities: Not At Risk (11/24/2022)  Tobacco Use: High Risk (12/04/2022)     Readmission Risk Interventions     No data to display

## 2022-12-07 DIAGNOSIS — K7581 Nonalcoholic steatohepatitis (NASH): Secondary | ICD-10-CM

## 2022-12-07 DIAGNOSIS — K7460 Unspecified cirrhosis of liver: Secondary | ICD-10-CM | POA: Diagnosis not present

## 2022-12-07 DIAGNOSIS — I5032 Chronic diastolic (congestive) heart failure: Secondary | ICD-10-CM | POA: Diagnosis not present

## 2022-12-07 DIAGNOSIS — K7682 Hepatic encephalopathy: Secondary | ICD-10-CM | POA: Diagnosis not present

## 2022-12-07 NOTE — Progress Notes (Signed)
  Progress Note   Patient: Carla Huerta QIO:[id number] DOB: Mar 17, 1951 DOA: 12/04/2022     3 DOS: the patient was seen and examined on 12/07/2022   Brief hospital course: Carla Huerta is a 72 y.o. female with medical history significant for COPD, CHF, depression, GERD, hypertension and known alcoholic hepatosteatosis, as well as stage III chronic kidney disease, who presented to the emergency room with acute onset of vomiting since this morning at her SNF.  Upon arriving the hospital, was very confused, vital signs are stable, no leukocytosis.  She had a profound elevation of ammonia level, was diagnosed with hepatic encephalopathy.  Given fluids, lactulose and rifaximin.    Assessment and Plan: Hepatic encephalopathy. 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 Condition much improved, mental status back to baseline.  Patient has daily bowel movement, will continue lactulose and rifaximin. Patient can be transferred back to nursing home, however, insurance company requested to have PT/OT reeval, and then apply for recertification.    Chronic kidney disease stage IIIb. Acute kidney injury ruled out. Metabolic acidosis. Condition improved, renal function back to baseline.   Morbid obesity with BMI 51.42. Obstructive sleep apnea. Diet exercise advised. CPAP while asleep.   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 Stable   COPD. Chronic diastolic congestive heart failure. Essential hypertension. Conditions are stable, continue to follow. Restarted diuretics at home dose.      Subjective: Patient doing well, no complaints.  Physical Exam: Vitals:   12/06/22 0748 12/06/22 1502 12/07/22 0116 12/07/22 0717  BP: (!) 122/56 105/67 (!) 110/57 (!) 112/51  Pulse: 83 100 86 81  Resp: 18 18 18 16   Temp: 98.1 F (36.7 C) 98.1 F (36.7 C) 98 F (36.7 C) 98.3 F (36.8 C)  TempSrc:      SpO2: 96% 96% 97% 96%  Weight:      Height:        General exam: Appears calm and comfortable, morbid obese. Respiratory system: Clear to auscultation. Respiratory effort normal. Cardiovascular system: S1 & S2 heard, RRR. No JVD, murmurs, rubs, gallops or clicks. No pedal edema. Gastrointestinal system: Abdomen is nondistended, soft and nontender. No organomegaly or masses felt. Normal bowel sounds heard. Central nervous system: Alert and oriented. No focal neurological deficits. Extremities: Symmetric 5 x 5 power. Skin: No rashes, lesions or ulcers Psychiatry: Judgement and insight appear normal. Mood & affect appropriate.   Data Reviewed:  No new labs today.  Family Communication: None  Disposition: Status is: Inpatient Remains inpatient appropriate because: Unsafe discharge,  Planned Discharge Destination: Skilled nursing facility    Time spent: 35 minutes  Author: Sharen Hones, MD 12/07/2022 2:07 PM  For on call review [email or web address].

## 2022-12-07 NOTE — TOC Progression Note (Addendum)
Transition of Care [facility]) - Progression Note    Patient Details  Name: Carla Huerta MRN: [id number] Date of Birth: 1951/10/20  Transition of Care [facility]) CM/SW [place], LCSW Phone Number: 12/07/2022, 8:34 AM  Clinical Narrative:    Sent referral to SNFs.   Call to daughter to clarify if she would like patient to return to [facility] or was wanting another SNF. She stated they prefer patient go to [facility], if they cannot take patient they are ok with her returning to [facility]. CSW asked Lavella Lemons at [facility] to review referral.   9:40- Per Lavella Lemons at [facility], they can accept patient. Lavella Lemons inquired if patient can bring her CPAP and if she needs a bariatric bed. Spoke to daughter who confirms patient uses a CPAP at night and can bring it to [facility] with her. Asked MD and RN about bed, per RN patient does not need bariatric bed. Per MD patient is medically ready. Updated Lavella Lemons and started insurance auth.   1:54- Auth still pending in [employer] Portal.   2:48- Per RN, daughter said [facility] can take patient back. Called daughter to confirm if they want [facility] or [facility]. She stated she wants to continue with [facility].  Josem Kaufmann is pending. Per Lavella Lemons, they can accept patient at [facility] on 1/28 if insurance approval comes back.   5:00- Call from daughter who states they now want [facility]. Reached out to Tammy at [facility] to confirm if they can make a bed offer. Tammy stated they can take patient on Monday 1/29. Updated Tanya at [facility]. Attempted call to [facility] to update SNF to [facility], unable to get through, will call in morning. Sent message on portal requesting update to [facility] as well.    Expected Discharge Plan: Skilled Nursing Facility Barriers to Discharge: SNF Pending bed offer  Expected Discharge Plan and Services       Living arrangements for the past 2 months: Single Family Home                                       Social Determinants of Health  (SDOH) Interventions SDOH Screenings   Food Insecurity: No Food Insecurity (11/24/2022)  Housing: Low Risk  (11/24/2022)  Transportation Needs: No Transportation Needs (11/24/2022)  Utilities: Not At Risk (11/24/2022)  Tobacco Use: High Risk (12/04/2022)    Readmission Risk Interventions     No data to display

## 2022-12-08 DIAGNOSIS — N1832 Chronic kidney disease, stage 3b: Secondary | ICD-10-CM

## 2022-12-08 DIAGNOSIS — K7682 Hepatic encephalopathy: Secondary | ICD-10-CM | POA: Diagnosis not present

## 2022-12-08 DIAGNOSIS — I5032 Chronic diastolic (congestive) heart failure: Secondary | ICD-10-CM | POA: Diagnosis not present

## 2022-12-08 MED ORDER — LACTULOSE 10 GM/15ML PO SOLN
45.0000 g | Freq: Three times a day (TID) | ORAL | Status: DC
  Administered 2022-12-08 (×2): 45 g via ORAL
  Filled 2022-12-08 (×2): qty 90

## 2022-12-08 MED ORDER — POLYETHYLENE GLYCOL 3350 17 G PO PACK
17.0000 g | PACK | Freq: Every day | ORAL | Status: DC
  Administered 2022-12-08: 17 g via ORAL
  Filled 2022-12-08: qty 1

## 2022-12-08 NOTE — TOC Progression Note (Addendum)
Transition of Care [facility]) - Progression Note    Patient Details  Name: Carla Huerta MRN: [id number] Date of Birth: 06-Oct-1951  Transition of Care [facility]) CM/SW [place], LCSW Phone Number: 12/08/2022, 8:32 AM  Clinical Narrative:    CSW called [facility], spoke to [employer]. Changed SNF to [facility] per daughter's request. Plan to DC to [facility] on 1/29 pending auth.   2:37- Auth still pending in [facility] portal.   4:39- Auth still pending.    Expected Discharge Plan: Skilled Nursing Facility Barriers to Discharge: SNF Pending bed offer  Expected Discharge Plan and Services       Living arrangements for the past 2 months: Single Family Home                                       Social Determinants of Health (SDOH) Interventions SDOH Screenings   Food Insecurity: No Food Insecurity (11/24/2022)  Housing: Low Risk  (11/24/2022)  Transportation Needs: No Transportation Needs (11/24/2022)  Utilities: Not At Risk (11/24/2022)  Tobacco Use: High Risk (12/04/2022)    Readmission Risk Interventions     No data to display

## 2022-12-08 NOTE — Progress Notes (Signed)
  Progress Note   Patient: Carla Huerta DOB: November 24, 1950 DOA: 12/04/2022     4 DOS: the patient was seen and examined on 12/08/2022   Brief hospital course: Carla Huerta is a 72 y.o. female with medical history significant for COPD, CHF, depression, GERD, hypertension and known alcoholic hepatosteatosis, as well as stage III chronic kidney disease, who presented to the emergency room with acute onset of vomiting since this morning at her SNF.  Upon arriving the hospital, was very confused, vital signs are stable, no leukocytosis.  She had a profound elevation of ammonia level, was diagnosed with hepatic encephalopathy.  Given fluids, lactulose and rifaximin.    Assessment and Plan: Hepatic encephalopathy. 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 Condition much improved, mental status back to baseline.  Patient has daily bowel movement, will continue lactulose and rifaximin. Patient can be transferred back to nursing home, however, insurance company requested to have PT/OT reeval, and then apply for recertification. Patient condition had improved, however, patient has not had a bowel movement since 1/26, she is on 30 g of lactulose 3 times a day, will increase to 45 g 3 times a day, also give a dose of MiraLAX.  Monitor electrolytes and renal function.  Chronic kidney disease stage IIIb. Acute kidney injury ruled out. Metabolic acidosis. Condition improved, renal function back to baseline.   Morbid obesity with BMI 51.42. Obstructive sleep apnea. Diet exercise advised. CPAP while asleep.   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 Stable   COPD. Chronic diastolic congestive heart failure. Essential hypertension. Conditions are stable, continue to follow. Restarted diuretics at home dose.        Subjective:  Patient slept well last night, currently she has no confusion. Last bowel movement was 1/26.  No abdominal  pain or nausea vomiting.  Physical Exam: Vitals:   12/07/22 0717 12/07/22 1557 12/08/22 0001 12/08/22 0726  BP: (!) 112/51 124/64 112/74 119/66  Pulse: 81 97 88 90  Resp: 16 16 18 17   Temp: 98.3 F (36.8 C) 98 F (36.7 C) 97.9 F (36.6 C) 97.7 F (36.5 C)  TempSrc:    Oral  SpO2: 96% 97% 97% 96%  Weight:      Height:       General exam: Appears calm and comfortable, morbidly obese. Respiratory system: Clear to auscultation. Respiratory effort normal. Cardiovascular system: S1 & S2 heard, RRR. No JVD, murmurs, rubs, gallops or clicks. No pedal edema. Gastrointestinal system: Abdomen is nondistended, soft and nontender. No organomegaly or masses felt. Normal bowel sounds heard. Central nervous system: Alert and oriented. No focal neurological deficits. Extremities: Symmetric 5 x 5 power. Skin: No rashes, lesions or ulcers Psychiatry: Judgement and insight appear normal. Mood & affect appropriate.   Data Reviewed:  Lab results reviewed.  Family Communication: None  Disposition: Status is: Inpatient Remains inpatient appropriate because: Unsafe discharge, pending insurance approval for nursing placement.  Planned Discharge Destination: Skilled nursing facility    Time spent: 35 minutes  Author: Sharen Hones, MD 12/08/2022 12:50 PM  For on call review [email or web address].

## 2022-12-08 NOTE — Plan of Care (Signed)

## 2022-12-08 NOTE — Progress Notes (Signed)
Patient seen earlier for cpap.  Patient was on room air in no distress. Patient refused cpap and o2. She states she is being discharged 01/28. If she changes her mind she will have RN to call RT

## 2022-12-09 DIAGNOSIS — K7682 Hepatic encephalopathy: Secondary | ICD-10-CM | POA: Diagnosis not present

## 2022-12-09 DIAGNOSIS — K7581 Nonalcoholic steatohepatitis (NASH): Secondary | ICD-10-CM | POA: Diagnosis not present

## 2022-12-09 DIAGNOSIS — K7460 Unspecified cirrhosis of liver: Secondary | ICD-10-CM | POA: Diagnosis not present

## 2022-12-09 DIAGNOSIS — I5032 Chronic diastolic (congestive) heart failure: Secondary | ICD-10-CM | POA: Diagnosis not present

## 2022-12-09 LAB — BASIC METABOLIC PANEL
Anion gap: 13 (ref 5–15)
BUN: 31 mg/dL — ABNORMAL HIGH (ref 8–23)
CO2: 22 mmol/L (ref 22–32)
Calcium: 11 mg/dL — ABNORMAL HIGH (ref 8.9–10.3)
Chloride: 101 mmol/L (ref 98–111)
Creatinine, Ser: 1.57 mg/dL — ABNORMAL HIGH (ref 0.44–1.00)
GFR, Estimated: 35 mL/min — ABNORMAL LOW (ref >60)
Glucose, Bld: 109 mg/dL — ABNORMAL HIGH (ref 70–99)
Potassium: 4.4 mmol/L (ref 3.5–5.1)
Sodium: 136 mmol/L (ref 135–145)

## 2022-12-09 LAB — MAGNESIUM: Magnesium: 2 mg/dL (ref 1.7–2.4)

## 2022-12-09 LAB — AMMONIA: Ammonia: 77 umol/L — ABNORMAL HIGH (ref 9–35)

## 2022-12-09 MED ORDER — LACTATED RINGERS IV BOLUS
500.0000 mL | Freq: Once | INTRAVENOUS | Status: AC
  Administered 2022-12-09: 500 mL via INTRAVENOUS

## 2022-12-09 MED ORDER — LACTULOSE 10 GM/15ML PO SOLN
30.0000 g | Freq: Three times a day (TID) | ORAL | Status: DC
  Administered 2022-12-09 – 2022-12-13 (×12): 30 g via ORAL
  Filled 2022-12-09 (×12): qty 60

## 2022-12-09 NOTE — Plan of Care (Signed)

## 2022-12-09 NOTE — Plan of Care (Signed)
  Problem: Education: Goal: Knowledge of General Education information will improve Description: Including pain rating scale, medication(s)/side effects and non-pharmacologic comfort measures Outcome: Progressing   Problem: Nutrition: Goal: Adequate nutrition will be maintained Outcome: Progressing   Problem: Elimination: Goal: Will not experience complications related to bowel motility Outcome: Progressing   Problem: Pain Managment: Goal: General experience of comfort will improve Outcome: Progressing   Problem: Skin Integrity: Goal: Risk for impaired skin integrity will decrease Outcome: Progressing   Problem: Safety: Goal: Ability to remain free from injury will improve Outcome: Progressing

## 2022-12-09 NOTE — Progress Notes (Signed)
  Progress Note   Patient: Carla Huerta CBS:[id number] DOB: 1951-03-22 DOA: 12/04/2022     5 DOS: the patient was seen and examined on 12/09/2022   Brief hospital course: TINSLEE KLARE is a 72 y.o. female with medical history significant for COPD, CHF, depression, GERD, hypertension and known alcoholic hepatosteatosis, as well as stage III chronic kidney disease, who presented to the emergency room with acute onset of vomiting since this morning at her SNF.  Upon arriving the hospital, was very confused, vital signs are stable, no leukocytosis.  She had a profound elevation of ammonia level, was diagnosed with hepatic encephalopathy.  Given fluids, lactulose and rifaximin.    Assessment and Plan:  Hepatic encephalopathy. 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 Condition much improved, mental status back to baseline.  Patient has daily bowel movement, will continue lactulose and rifaximin. Patient can be transferred back to nursing home, however, insurance company requested to have PT/OT reeval, and then apply for recertification. Patient had multiple loose stools after increased dose of the lactulose.  She has a mild dehydration, will give 500 mL fluid bolus.  Reduce lactulose back to 30 g 3 times a day.   Chronic kidney disease stage IIIb. Acute kidney injury ruled out. Metabolic acidosis. Condition improved, renal function back to baseline.   Morbid obesity with BMI 51.42. Obstructive sleep apnea. Diet exercise advised. CPAP while asleep.   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 Stable   COPD. Chronic diastolic congestive heart failure. Essential hypertension. Conditions are stable, continue to follow. Restarted diuretics at home dose.     Subjective:  Patient had a multiple loose stools after increased dose of lactulose.  Physical Exam: Vitals:   12/08/22 1454 12/08/22 1523 12/08/22 2310 12/09/22 0906  BP: 124/77 (!)  103/55 (!) 145/77 128/67  Pulse: 100 99 (!) 110 95  Resp: 18 16 20 18   Temp: 98 F (36.7 C) 98.1 F (36.7 C) 98.2 F (36.8 C) 98.1 F (36.7 C)  TempSrc:    Oral  SpO2: 97% 100% 97% 93%  Weight:      Height:       General exam: Appears calm and comfortable  Respiratory system: Clear to auscultation. Respiratory effort normal. Cardiovascular system: S1 & S2 heard, RRR. No JVD, murmurs, rubs, gallops or clicks. No pedal edema. Gastrointestinal system: Abdomen is nondistended, soft and nontender. No organomegaly or masses felt. Normal bowel sounds heard. Central nervous system: Alert and oriented. No focal neurological deficits. Extremities: Symmetric 5 x 5 power. Skin: No rashes, lesions or ulcers Psychiatry: Judgement and insight appear normal. Mood & affect appropriate.   Data Reviewed:  Lab results reviewed.  Family Communication:   Disposition: Status is: Inpatient Remains inpatient appropriate because: Disease severity  Planned Discharge Destination: Skilled nursing facility    Time spent: 35 minutes  Author: Sharen Hones, MD 12/09/2022 12:34 PM  For on call review [email or web address].

## 2022-12-09 NOTE — Care Management Important Message (Signed)
Important Message  Patient Details  Name: Carla Huerta MRN: [id number] Date of Birth: 05-15-1951   Medicare Important Message Given:  Yes     Juliann Pulse A Alaa Mullally 12/09/2022, 2:23 PM

## 2022-12-09 NOTE — Progress Notes (Signed)
Physical Therapy Treatment Patient Details Name: Carla Huerta MRN: [id number] DOB: 05-09-1951 Today's Date: 12/09/2022   History of Present Illness Patient is a 72 year old female presenting with acute onset of vomiting from SNF. Found to have hepatic encephalopathy.    PT Comments    Patient agreeable to PT. She is hopeful to discharge to rehab soon. She continues to require assistance with bed mobility, transfers, and short distance ambulation with rolling walker. She is fatigued with activity and does not appear to be at her baseline level of functional mobility. Recommend SNF for short term rehab.    Recommendations for follow up therapy are one component of a multi-disciplinary discharge planning process, led by the attending physician.  Recommendations may be updated based on patient status, additional functional criteria and insurance authorization.  Follow Up Recommendations  Skilled nursing-short term rehab (<3 hours/day) Can patient physically be transported by private vehicle: No   Assistance Recommended at Discharge Frequent or constant Supervision/Assistance  Patient can return home with the following A lot of help with walking and/or transfers;A lot of help with bathing/dressing/bathroom;Help with stairs or ramp for entrance   Equipment Recommendations  None recommended by PT    Recommendations for Other Services       Precautions / Restrictions Precautions Precautions: Fall Restrictions Weight Bearing Restrictions: No     Mobility  Bed Mobility Overal bed mobility: Needs Assistance Bed Mobility: Supine to Sit     Supine to sit: Min assist     General bed mobility comments: assistance for trunk support to sit upright. verbal cues for task initiation    Transfers Overall transfer level: Needs assistance Equipment used: Rolling walker (2 wheels) Transfers: Sit to/from Stand Sit to Stand: Min assist           General transfer comment: lifting  assistance required for standing.    Ambulation/Gait Ambulation/Gait assistance: Min assist Gait Distance (Feet): 5 Feet Assistive device: Rolling walker (2 wheels) Gait Pattern/deviations: Step-through pattern Gait velocity: decreased     General Gait Details: verbal cues for technique. steadying assistance provided. patient fatigued with activity   Stairs             Wheelchair Mobility    Modified Rankin (Stroke Patients Only)       Balance Overall balance assessment: Needs assistance Sitting-balance support: Feet supported Sitting balance-Leahy Scale: Fair     Standing balance support: Bilateral upper extremity supported Standing balance-Leahy Scale: Fair Standing balance comment: with rolling walker for support                            Cognition Arousal/Alertness: Awake/alert Behavior During Therapy: WFL for tasks assessed/performed Overall Cognitive Status: Within Functional Limits for tasks assessed                                          Exercises      General Comments        Pertinent Vitals/Pain Pain Assessment Pain Assessment: No/denies pain    Home Living                          Prior Function            PT Goals (current goals can now be found in the care plan section) Acute Rehab PT  Goals Patient Stated Goal: to get to rehab PT Goal Formulation: With patient Time For Goal Achievement: 12/20/22 Potential to Achieve Goals: Good Progress towards PT goals: Progressing toward goals    Frequency    Min 2X/week      PT Plan Current plan remains appropriate    Co-evaluation              AM-PAC PT "6 Clicks" Mobility   Outcome Measure  Help needed turning from your back to your side while in a flat bed without using bedrails?: A Little Help needed moving from lying on your back to sitting on the side of a flat bed without using bedrails?: A Lot Help needed moving to and from  a bed to a chair (including a wheelchair)?: A Little Help needed standing up from a chair using your arms (e.g., wheelchair or bedside chair)?: A Little Help needed to walk in hospital room?: A Little Help needed climbing 3-5 steps with a railing? : Total 6 Click Score: 15    End of Session   Activity Tolerance: Patient limited by fatigue;Patient tolerated treatment well Patient left: in chair;with call bell/phone within reach Nurse Communication: Mobility status PT Visit Diagnosis: Unsteadiness on feet (R26.81);Muscle weakness (generalized) (M62.81)     Time: 1610-9604 PT Time Calculation (min) (ACUTE ONLY): 12 min  Charges:  $Therapeutic Activity: 8-22 mins                     Carla Huerta, PT, MPT    Percell Locus 12/09/2022, 12:44 PM

## 2022-12-10 DIAGNOSIS — N1832 Chronic kidney disease, stage 3b: Secondary | ICD-10-CM | POA: Diagnosis not present

## 2022-12-10 DIAGNOSIS — K7581 Nonalcoholic steatohepatitis (NASH): Secondary | ICD-10-CM | POA: Diagnosis not present

## 2022-12-10 DIAGNOSIS — K7682 Hepatic encephalopathy: Secondary | ICD-10-CM | POA: Diagnosis not present

## 2022-12-10 DIAGNOSIS — I5032 Chronic diastolic (congestive) heart failure: Secondary | ICD-10-CM | POA: Diagnosis not present

## 2022-12-10 LAB — BASIC METABOLIC PANEL
Anion gap: 8 (ref 5–15)
BUN: 31 mg/dL — ABNORMAL HIGH (ref 8–23)
CO2: 24 mmol/L (ref 22–32)
Calcium: 10.5 mg/dL — ABNORMAL HIGH (ref 8.9–10.3)
Chloride: 100 mmol/L (ref 98–111)
Creatinine, Ser: 1.47 mg/dL — ABNORMAL HIGH (ref 0.44–1.00)
GFR, Estimated: 38 mL/min — ABNORMAL LOW (ref >60)
Glucose, Bld: 101 mg/dL — ABNORMAL HIGH (ref 70–99)
Potassium: 4.2 mmol/L (ref 3.5–5.1)
Sodium: 132 mmol/L — ABNORMAL LOW (ref 135–145)

## 2022-12-10 LAB — MAGNESIUM: Magnesium: 2.1 mg/dL (ref 1.7–2.4)

## 2022-12-10 LAB — AMMONIA: Ammonia: 62 umol/L — ABNORMAL HIGH (ref 9–35)

## 2022-12-10 NOTE — Progress Notes (Signed)
Progress Note   Patient: Carla Huerta ZOX:[id number] DOB: July 20, 1951 DOA: 12/04/2022     6 DOS: the patient was seen and examined on 12/10/2022   Brief hospital course: Carla Huerta is a 72 y.o. female with medical history significant for COPD, CHF, depression, GERD, hypertension and known alcoholic hepatosteatosis, as well as stage III chronic kidney disease, who presented to the emergency room with acute onset of vomiting since this morning at her SNF.  Upon arriving the hospital, was very confused, vital signs are stable, no leukocytosis.  She had a profound elevation of ammonia level, was diagnosed with hepatic encephalopathy.  Given fluids, lactulose and rifaximin.     Principal Problem:   Hepatic encephalopathy (HCC) Active Problems:   Dyslipidemia   Depression   Liver cirrhosis secondary to NASH (nonalcoholic steatohepatitis) (HCC)   Obesity, morbid, BMI 50 or higher (HCC)   Gout   Peripheral neuropathy   Obstructive sleep apnea   Metabolic acidosis   Thrombocytopenia, acquired (HCC)   Chronic kidney disease, stage 3b (HCC)   Chronic diastolic CHF (congestive heart failure) (HCC)   COPD (chronic obstructive pulmonary disease) (HCC)   Essential hypertension   Hypercalcemia   Assessment and Plan: Hepatic encephalopathy. 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 Condition much improved, mental status back to baseline.  Patient has daily bowel movement, will continue lactulose and rifaximin. Patient can be transferred back to nursing home, however, insurance company requested to have PT/OT reeval, and then apply for recertification. Patient mental status has improved, however, ammonia level still elevated significantly.  She received IV fluid yesterday due to multiple loose stools.  I will hold off patient diuretics.  Continue lactulose and rifaximin at 30 g 3 times a day. Patient may be able to transfer to nursing home tomorrow.  Patient has  a bed assignment.  Hypercalcemia. Could be secondary to dehydration from loose stool. PTH level pending.   Chronic kidney disease stage IIIb. Acute kidney injury ruled out. Metabolic acidosis. Condition improved, renal function back to baseline.   Morbid obesity with BMI 51.42. Obstructive sleep apnea. Diet exercise advised. CPAP while asleep.   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 Stable   COPD. Chronic diastolic congestive heart failure. Essential hypertension. Conditions are stable, continue to follow. Restarted diuretics at home dose.      Subjective:  Patient slept well, he still has bowel movement daily.  Physical Exam: Vitals:   12/09/22 0906 12/09/22 1659 12/09/22 2230 12/10/22 0750  BP: 128/67 134/73 138/80 (!) 126/57  Pulse: 95 95 98 88  Resp: 18 18 20 16   Temp: 98.1 F (36.7 C) 98.2 F (36.8 C) 98.7 F (37.1 C) 97.8 F (36.6 C)  TempSrc: Oral     SpO2: 93% 96% 98% 93%  Weight:      Height:       General exam: Appears calm and comfortable, morbid obese. Respiratory system: Clear to auscultation. Respiratory effort normal. Cardiovascular system: S1 & S2 heard, RRR. No JVD, murmurs, rubs, gallops or clicks. No pedal edema. Gastrointestinal system: Abdomen is nondistended, soft and nontender. No organomegaly or masses felt. Normal bowel sounds heard. Central nervous system: Alert and oriented. No focal neurological deficits. Extremities: Symmetric 5 x 5 power. Skin: No rashes, lesions or ulcers Psychiatry: Judgement and insight appear normal. Mood & affect appropriate.    Data Reviewed:  Results reviewed.  Family Communication: None  Disposition: Status is: Inpatient Remains inpatient appropriate because: Disease severity     Time  spent: 35 minutes  Author: Sharen Hones, MD 12/10/2022 1:47 PM  For on call review [email or web address].

## 2022-12-10 NOTE — Progress Notes (Addendum)
Carla Huerta is approved.  Message sent to doctor to see if pt is medically ready to discharge today.  1:56pm  Dr responded to say patient will remain IP for 1 more day.  Information relayed to Tammy at [facility].

## 2022-12-10 NOTE — Plan of Care (Signed)
  Problem: Education: Goal: Knowledge of General Education information will improve Description: Including pain rating scale, medication(s)/side effects and non-pharmacologic comfort measures Outcome: Progressing   Problem: Elimination: Goal: Will not experience complications related to bowel motility Outcome: Progressing   Problem: Nutrition: Goal: Adequate nutrition will be maintained Outcome: Progressing   Problem: Activity: Goal: Risk for activity intolerance will decrease Outcome: Progressing   Problem: Pain Managment: Goal: General experience of comfort will improve Outcome: Progressing   Problem: Safety: Goal: Ability to remain free from injury will improve Outcome: Progressing   Problem: Skin Integrity: Goal: Risk for impaired skin integrity will decrease Outcome: Progressing   

## 2022-12-11 DIAGNOSIS — K7682 Hepatic encephalopathy: Secondary | ICD-10-CM | POA: Diagnosis not present

## 2022-12-11 LAB — BASIC METABOLIC PANEL
Anion gap: 7 (ref 5–15)
BUN: 31 mg/dL — ABNORMAL HIGH (ref 8–23)
CO2: 23 mmol/L (ref 22–32)
Calcium: 10.9 mg/dL — ABNORMAL HIGH (ref 8.9–10.3)
Chloride: 103 mmol/L (ref 98–111)
Creatinine, Ser: 1.36 mg/dL — ABNORMAL HIGH (ref 0.44–1.00)
GFR, Estimated: 42 mL/min — ABNORMAL LOW (ref >60)
Glucose, Bld: 94 mg/dL (ref 70–99)
Potassium: 4.7 mmol/L (ref 3.5–5.1)
Sodium: 133 mmol/L — ABNORMAL LOW (ref 135–145)

## 2022-12-11 LAB — CBC
HCT: 34 % — ABNORMAL LOW (ref 36.0–46.0)
Hemoglobin: 10.6 g/dL — ABNORMAL LOW (ref 12.0–15.0)
MCH: 27.7 pg (ref 26.0–34.0)
MCHC: 31.2 g/dL (ref 30.0–36.0)
MCV: 88.8 fL (ref 80.0–100.0)
Platelets: 96 10*3/uL — ABNORMAL LOW (ref 150–400)
RBC: 3.83 MIL/uL — ABNORMAL LOW (ref 3.87–5.11)
RDW: 22.5 % — ABNORMAL HIGH (ref 11.5–15.5)
WBC: 5.4 10*3/uL (ref 4.0–10.5)
nRBC: 0 % (ref 0.0–0.2)

## 2022-12-11 LAB — PTH, INTACT AND CALCIUM
Calcium, Total (PTH): 11 mg/dL — ABNORMAL HIGH (ref 8.7–10.3)
PTH: 8 pg/mL — ABNORMAL LOW (ref 15–65)

## 2022-12-11 LAB — AMMONIA: Ammonia: 72 umol/L — ABNORMAL HIGH (ref 9–35)

## 2022-12-11 NOTE — Plan of Care (Signed)
  Problem: Health Behavior/Discharge Planning: Goal: Ability to manage health-related needs will improve Outcome: Progressing   Problem: Clinical Measurements: Goal: Ability to maintain clinical measurements within normal limits will improve Outcome: Progressing   Problem: Clinical Measurements: Goal: Will remain free from infection Outcome: Progressing   Problem: Clinical Measurements: Goal: Respiratory complications will improve Outcome: Progressing   Problem: Coping: Goal: Level of anxiety will decrease Outcome: Progressing   Problem: Elimination: Goal: Will not experience complications related to bowel motility Outcome: Progressing   Problem: Elimination: Goal: Will not experience complications related to urinary retention Outcome: Progressing

## 2022-12-11 NOTE — Progress Notes (Signed)
Physical Therapy Treatment Patient Details Name: Carla Huerta MRN: [id number] DOB: 04/17/1951 Today's Date: 12/11/2022   History of Present Illness Patient is a 72 year old female presenting with acute onset of vomiting from SNF. Found to have hepatic encephalopathy.    PT Comments    Patient is agreeable to PT. She was seated in the chair eating lunch. The patient continues to require assistance for standing. Steadying assistance also provided with ambulating a short distance with rolling walker. No dizziness reported with standing activity today. The patient is not at her baseline level of functional mobility. Recommend SNF for short term rehab at discharge.    Recommendations for follow up therapy are one component of a multi-disciplinary discharge planning process, led by the attending physician.  Recommendations may be updated based on patient status, additional functional criteria and insurance authorization.  Follow Up Recommendations  Skilled nursing-short term rehab (<3 hours/day) Can patient physically be transported by private vehicle: No   Assistance Recommended at Discharge Frequent or constant Supervision/Assistance  Patient can return home with the following A lot of help with walking and/or transfers;A lot of help with bathing/dressing/bathroom;Help with stairs or ramp for entrance   Equipment Recommendations  None recommended by PT    Recommendations for Other Services       Precautions / Restrictions Precautions Precautions: Fall Restrictions Weight Bearing Restrictions: No     Mobility  Bed Mobility               General bed mobility comments: not addressed as patient sitting up on arrival and post session    Transfers Overall transfer level: Needs assistance Equipment used: Rolling walker (2 wheels) Transfers: Sit to/from Stand Sit to Stand: Min assist           General transfer comment: lifting assistance required for standing. verbal  cues for safe hand placement with carry over demonstrated    Ambulation/Gait Ambulation/Gait assistance: Min assist Gait Distance (Feet): 40 Feet Assistive device: Rolling walker (2 wheels) Gait Pattern/deviations: Decreased stride length Gait velocity: decreased     General Gait Details: patient required steadying assistance with ambulation using rolling walker. occasional cues for safety and patient fatigued with activity. no dizziness today with ambulation   Stairs             Wheelchair Mobility    Modified Rankin (Stroke Patients Only)       Balance Overall balance assessment: Needs assistance Sitting-balance support: Feet supported Sitting balance-Leahy Scale: Good     Standing balance support: Single extremity supported Standing balance-Leahy Scale: Fair                              Cognition Arousal/Alertness: Awake/alert Behavior During Therapy: WFL for tasks assessed/performed Overall Cognitive Status: Within Functional Limits for tasks assessed                                          Exercises      General Comments        Pertinent Vitals/Pain Pain Assessment Pain Assessment: No/denies pain    Home Living                          Prior Function            PT Goals (current  goals can now be found in the care plan section) Acute Rehab PT Goals Patient Stated Goal: to get to rehab PT Goal Formulation: With patient Time For Goal Achievement: 12/20/22 Potential to Achieve Goals: Good Progress towards PT goals: Progressing toward goals    Frequency    Min 2X/week      PT Plan Current plan remains appropriate    Co-evaluation              AM-PAC PT "6 Clicks" Mobility   Outcome Measure  Help needed turning from your back to your side while in a flat bed without using bedrails?: A Little Help needed moving from lying on your back to sitting on the side of a flat bed without using  bedrails?: A Lot Help needed moving to and from a bed to a chair (including a wheelchair)?: A Little Help needed standing up from a chair using your arms (e.g., wheelchair or bedside chair)?: A Little Help needed to walk in hospital room?: A Little Help needed climbing 3-5 steps with a railing? : Total 6 Click Score: 15    End of Session         PT Visit Diagnosis: Unsteadiness on feet (R26.81);Muscle weakness (generalized) (M62.81)     Time: 5993-5701 PT Time Calculation (min) (ACUTE ONLY): 26 min  Charges:  $Gait Training: 8-22 mins $Therapeutic Activity: 8-22 mins                     Minna Merritts, PT, MPT    Percell Locus 12/11/2022, 2:31 PM

## 2022-12-11 NOTE — Progress Notes (Signed)
Auth date extended until midnight 2/4.  Patient was scheduled to go to [facility] on yesterday, however, doctor stated that pt needed to stay one more night.  MD sent Epic chat to CSW to say that pt could d/c today to [facility].  After CSW spoke with pt, doctor sent another chat to say that pt had to stay another night.  CSW reached out to facility.  Tammy at [facility] stated that they couldn't guarantee that there would be a bed available for pt on tomorrow if they discharge.  CSW updated pt.

## 2022-12-11 NOTE — Progress Notes (Signed)
Occupational Therapy Treatment Carla Huerta Details Name: Carla Huerta MRN: [id number] DOB: 07-16-1951 Today's Date: 12/11/2022   History of present illness Carla Huerta is a 72 year old female presenting with acute onset of vomiting from SNF. Found to have hepatic encephalopathy.   OT comments  Carla Huerta was seen for OT treatment on this date. Upon arrival to room pt seated in chair, agreeable to tx. Pt requires MIN A + RW for BSC t/f (urinary incontinence standing). SETUP pericare seated. MAX A don B socks seated. Pt making good progress toward goals, will continue to follow POC. Discharge recommendation remains appropriate.     Recommendations for follow up therapy are one component of a multi-disciplinary discharge planning process, led by the attending physician.  Recommendations may be updated based on Carla Huerta status, additional functional criteria and insurance authorization.    Follow Up Recommendations  Skilled nursing-short term rehab (<3 hours/day)     Assistance Recommended at Discharge Intermittent Supervision/Assistance  Carla Huerta can return home with the following  A little help with walking and/or transfers;Assistance with cooking/housework;Assist for transportation;Help with stairs or ramp for entrance;A little help with bathing/dressing/bathroom;Direct supervision/assist for medications management   Equipment Recommendations  BSC/3in1    Recommendations for Other Services      Precautions / Restrictions Precautions Precautions: Fall Restrictions Weight Bearing Restrictions: No       Mobility Bed Mobility               General bed mobility comments: NT    Transfers Overall transfer level: Needs assistance Equipment used: Rolling walker (2 wheels) Transfers: Sit to/from Stand Sit to Stand: Min assist                 Balance Overall balance assessment: Needs assistance Sitting-balance support: Feet supported Sitting balance-Leahy Scale: Good      Standing balance support: Single extremity supported Standing balance-Leahy Scale: Fair                             ADL either performed or assessed with clinical judgement   ADL Overall ADL's : Needs assistance/impaired                                       General ADL Comments: MIN A + RW for BSC t/f (urinary incontinence standing). SETUP pericare seated. MAX A don B socks seated.     Praxis      Cognition Arousal/Alertness: Awake/alert Behavior During Therapy: WFL for tasks assessed/performed Overall Cognitive Status: Within Functional Limits for tasks assessed                                           Pertinent Vitals/ Pain       Pain Assessment Pain Assessment: No/denies pain   Frequency  Min 2X/week        Progress Toward Goals  OT Goals(current goals can now be found in the care plan section)  Progress towards OT goals: Progressing toward goals  Acute Rehab OT Goals Carla Huerta Stated Goal: to return to PLOF OT Goal Formulation: With Carla Huerta Time For Goal Achievement: 12/20/22 Potential to Achieve Goals: Good ADL Goals Pt Will Perform Grooming: with modified independence;standing Pt Will Perform Lower Body Dressing: with modified independence;sit to/from stand Pt  Will Transfer to Toilet: with modified independence;ambulating;regular height toilet  Plan Discharge plan remains appropriate;Frequency remains appropriate    Co-evaluation                 AM-PAC OT "6 Clicks" Daily Activity     Outcome Measure   Help from another person eating meals?: None Help from another person taking care of personal grooming?: A Little Help from another person toileting, which includes using toliet, bedpan, or urinal?: A Lot Help from another person bathing (including washing, rinsing, drying)?: A Lot Help from another person to put on and taking off regular upper body clothing?: A Little Help from another person to  put on and taking off regular lower body clothing?: A Lot 6 Click Score: 16    End of Session    OT Visit Diagnosis: Unsteadiness on feet (R26.81);History of falling (Z91.81);Muscle weakness (generalized) (M62.81)   Activity Tolerance Carla Huerta tolerated treatment well   Carla Huerta Left in chair;with call bell/phone within reach;with chair alarm set   Nurse Communication          Time: 0277-4128 OT Time Calculation (min): 11 min  Charges: OT General Charges $OT Visit: 1 Visit OT Treatments $Self Care/Home Management : 8-22 mins  Dessie Coma, M.S. OTR/L  12/11/22, 11:34 AM  ascom [phone]

## 2022-12-11 NOTE — Progress Notes (Signed)
[facility] Progress Note  Patient: Carla Huerta    Carla Huerta:[id number]  DOA: 12/04/2022     Date of Service: the patient was seen and examined on 12/11/2022  Chief Complaint  Patient presents with   Emesis   Principal Problem:   Hepatic encephalopathy ([place]) Active Problems:   Dyslipidemia   Depression   Liver cirrhosis secondary to NASH (nonalcoholic steatohepatitis) (HCC)   Obesity, morbid, BMI 50 or higher (HCC)   Gout   Peripheral neuropathy   Obstructive sleep apnea   Metabolic acidosis   Thrombocytopenia, acquired ([place])   Chronic kidney disease, stage 3b (HCC)   Chronic diastolic CHF (congestive heart failure) (HCC)   COPD (chronic obstructive pulmonary disease) ([place])   Essential hypertension   Hypercalcemia  Brief hospital course: Carla Huerta is a 72 y.o. female with medical history significant for COPD, CHF, depression, GERD, hypertension and known alcoholic hepatosteatosis, as well as stage III chronic kidney disease, who presented to the emergency room with acute onset of vomiting since this morning at her SNF.  Upon arriving the hospital, was very confused, vital signs are stable, no leukocytosis.  She had a profound elevation of ammonia level, was diagnosed with hepatic encephalopathy.  Given fluids, lactulose and rifaximin.    Assessment and Plan: Hepatic encephalopathy. 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 Continue lactulose 30 g 3 times a day and rifaximin 550 mg p.o. twice daily Ammonia level 72, still high Continue monitor ammonia level, patient's mental status is back to her baseline Patient's daughter stated that her condition deteriorates quickly with high ammonia level and it fluctuates a lot so she was not comfortable to discharge her today. Lasix and spironolactone were held because of dehydration, we will resume on discharge    Hypercalcemia. Could be secondary to dehydration from loose stool. PTH level  pending.   Chronic kidney disease stage IIIb. Acute kidney injury ruled out. Metabolic acidosis, resolved CO2 23 WNL Condition improved, renal function back to baseline.     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 Stable   COPD. Chronic diastolic congestive heart failure. Essential hypertension. Conditions are stable, continue to follow. Will restart diuretics on discharge   Morbid obesity with BMI 51.42 kg/m.  Obstructive sleep apnea. Diet exercise advised. CPAP while asleep.     Diet: Heart healthy diet DVT Prophylaxis: Subcutaneous Lovenox   Advance goals of care discussion: Full code  Family Communication: family was not present at bedside, at the time of interview.  The pt provided permission to discuss medical plan with the family. Opportunity was given to ask question and all questions were answered satisfactorily.  1/31 d/w patient's daughter over the phone  Disposition:  Pt is from Home, admitted with hepatic encephalopathy, still has high ammonia level, which precludes a safe discharge. Discharge to SNF, when clinically stable, still has high ammonia level, may discharge in 1 to 2 days..  Subjective: No significant events overnight, patient is AOx3, stated that she did not move bowels yesterday and no bowel movement today but passing gas.  Denies any abdominal pain, no nausea vomiting.  Patient denies any chest pain or pain, no shortness of breath.  Patient was sitting comfortably in the recliner.  Physical Exam: General: NAD, lying comfortably Appear in no distress, affect appropriate Eyes: PERRLA ENT: Oral Mucosa Clear, moist  Neck: no JVD,  Cardiovascular: S1 and S2 Present, no Murmur,  Respiratory: good respiratory effort, Bilateral Air entry equal and Decreased, no Crackles, no wheezes Abdomen:  Bowel Sound present, Soft, obese and no tenderness,  Skin: no rashes Extremities: no Pedal edema, no calf tenderness Neurologic: without any new  focal findings Gait not checked due to patient safety concerns  Vitals:   12/09/22 2230 12/10/22 0750 12/11/22 0102 12/11/22 0744  BP: 138/80 (!) 126/57 (!) 111/56 122/61  Pulse: 98 88 92 90  Resp: 20 16 18 14   Temp: 98.7 F (37.1 C) 97.8 F (36.6 C) 98 F (36.7 C) 98.5 F (36.9 C)  TempSrc:      SpO2: 98% 93% 95% 94%  Weight:      Height:        Intake/Output Summary (Last 24 hours) at 12/11/2022 1448 Last data filed at 12/11/2022 0102 Gross per 24 hour  Intake --  Output 1000 ml  Net -1000 ml   Filed Weights   12/04/22 2025  Weight: (!) 140.2 kg    Data Reviewed: I have personally reviewed and interpreted daily labs, tele strips, imagings as discussed above. I reviewed all nursing notes, pharmacy notes, vitals, pertinent old records I have discussed plan of care as described above with RN and patient/family.  CBC: Recent Labs  Lab 12/05/22 0520 12/11/22 0300  WBC 4.4 5.4  HGB 10.8* 10.6*  HCT 35.6* 34.0*  MCV 87.7 88.8  PLT 122* 96*   Basic Metabolic Panel: Recent Labs  Lab 12/05/22 0520 12/06/22 0421 12/09/22 0232 12/10/22 0208 12/11/22 0300  NA 138 137 136 132* 133*  K 4.0 4.0 4.4 4.2 4.7  CL 106 105 101 100 103  CO2 18* 27 22 24 23   GLUCOSE 80 87 109* 101* 94  BUN 36* 36* 31* 31* 31*  CREATININE 1.69* 1.67* 1.57* 1.47* 1.36*  CALCIUM 9.2 9.9 11.0* 10.5* 10.9*  MG  --  2.1 2.0 2.1  --     Studies: No results found.  Scheduled Meds:  allopurinol  100 mg Oral Daily   aspirin  81 mg Oral QHS   atorvastatin  40 mg Oral Daily   cholecalciferol  1,000 Units Oral Daily   enoxaparin (LOVENOX) injection  0.5 mg/kg Subcutaneous Q24H   ferrous sulfate  325 mg Oral Q breakfast   fesoterodine  4 mg Oral Daily   gabapentin  600 mg Oral QHS   imipramine  100 mg Oral QHS   lactulose  30 g Oral TID   pantoprazole  40 mg Oral Daily   rifaximin  550 mg Oral BID   Continuous Infusions: PRN Meds: acetaminophen **OR** acetaminophen, albuterol, magnesium  hydroxide, ondansetron **OR** ondansetron (ZOFRAN) IV, traZODone  Time spent: 35 minutes  Author: Val Riles. MD [facility] 12/11/2022 2:48 PM  To reach On-call, see care teams to locate the attending and reach out to them via [email or web address]. If 7PM-7AM, please contact night-coverage If you still have difficulty reaching the attending provider, please page the [facility] (Director on Call) for [facility] on amion for assistance.

## 2022-12-12 DIAGNOSIS — K7682 Hepatic encephalopathy: Secondary | ICD-10-CM | POA: Diagnosis not present

## 2022-12-12 LAB — CBC
HCT: 35.6 % — ABNORMAL LOW (ref 36.0–46.0)
Hemoglobin: 11 g/dL — ABNORMAL LOW (ref 12.0–15.0)
MCH: 27.7 pg (ref 26.0–34.0)
MCHC: 30.9 g/dL (ref 30.0–36.0)
MCV: 89.7 fL (ref 80.0–100.0)
Platelets: 91 10*3/uL — ABNORMAL LOW (ref 150–400)
RBC: 3.97 MIL/uL (ref 3.87–5.11)
RDW: 22.4 % — ABNORMAL HIGH (ref 11.5–15.5)
WBC: 4.6 10*3/uL (ref 4.0–10.5)
nRBC: 0 % (ref 0.0–0.2)

## 2022-12-12 LAB — BASIC METABOLIC PANEL
Anion gap: 9 (ref 5–15)
BUN: 33 mg/dL — ABNORMAL HIGH (ref 8–23)
CO2: 24 mmol/L (ref 22–32)
Calcium: 10.7 mg/dL — ABNORMAL HIGH (ref 8.9–10.3)
Chloride: 103 mmol/L (ref 98–111)
Creatinine, Ser: 1.22 mg/dL — ABNORMAL HIGH (ref 0.44–1.00)
GFR, Estimated: 48 mL/min — ABNORMAL LOW (ref >60)
Glucose, Bld: 91 mg/dL (ref 70–99)
Potassium: 4.4 mmol/L (ref 3.5–5.1)
Sodium: 136 mmol/L (ref 135–145)

## 2022-12-12 LAB — AMMONIA: Ammonia: 95 umol/L — ABNORMAL HIGH (ref 9–35)

## 2022-12-12 LAB — MAGNESIUM: Magnesium: 2.1 mg/dL (ref 1.7–2.4)

## 2022-12-12 LAB — PHOSPHORUS: Phosphorus: 3.1 mg/dL (ref 2.5–4.6)

## 2022-12-12 MED ORDER — BISACODYL 10 MG RE SUPP
10.0000 mg | Freq: Once | RECTAL | Status: AC
  Administered 2022-12-12: 10 mg via RECTAL
  Filled 2022-12-12: qty 1

## 2022-12-12 MED ORDER — BISACODYL 5 MG PO TBEC
10.0000 mg | DELAYED_RELEASE_TABLET | Freq: Every day | ORAL | Status: DC
  Filled 2022-12-12: qty 2

## 2022-12-12 NOTE — Plan of Care (Signed)
  Problem: Education: Goal: Knowledge of General Education information will improve Description: Including pain rating scale, medication(s)/side effects and non-pharmacologic comfort measures Outcome: Progressing   Problem: Health Behavior/Discharge Planning: Goal: Ability to manage health-related needs will improve Outcome: Progressing   Problem: Activity: Goal: Risk for activity intolerance will decrease Outcome: Progressing   Problem: Nutrition: Goal: Adequate nutrition will be maintained Outcome: Progressing   Problem: Coping: Goal: Level of anxiety will decrease Outcome: Progressing   Problem: Elimination: Goal: Will not experience complications related to bowel motility Outcome: Progressing Goal: Will not experience complications related to urinary retention Outcome: Progressing   Problem: Pain Managment: Goal: General experience of comfort will improve Outcome: Progressing   Problem: Safety: Goal: Ability to remain free from injury will improve Outcome: Progressing   Problem: Skin Integrity: Goal: Risk for impaired skin integrity will decrease Outcome: Progressing

## 2022-12-12 NOTE — Care Management Important Message (Signed)
Important Message  Patient Details  Name: Carla Huerta MRN: [id number] Date of Birth: 1950/12/07   Medicare Important Message Given:  Yes     Dannette Barbara 12/12/2022, 2:47 PM

## 2022-12-12 NOTE — Plan of Care (Signed)
  Problem: Clinical Measurements: Goal: Ability to maintain clinical measurements within normal limits will improve Outcome: Progressing   Problem: Clinical Measurements: Goal: Diagnostic test results will improve Outcome: Progressing   Problem: Clinical Measurements: Goal: Cardiovascular complication will be avoided Outcome: Progressing   Problem: Activity: Goal: Risk for activity intolerance will decrease Outcome: Progressing   Problem: Nutrition: Goal: Adequate nutrition will be maintained Outcome: Progressing

## 2022-12-12 NOTE — Progress Notes (Signed)
Physical Therapy Treatment Patient Details Name: Carla Huerta MRN: [id number] DOB: 1951/10/24 Today's Date: 12/12/2022   History of Present Illness Patient is a 72 year old female presenting with acute onset of vomiting from SNF. Found to have hepatic encephalopathy.    PT Comments    Patient participated in LE exercises for strengthening. She declined walking at this time. Continue to recommend SNF placement. PT will continue to follow to maximize independence and facilitate return to prior level of function.    Recommendations for follow up therapy are one component of a multi-disciplinary discharge planning process, led by the attending physician.  Recommendations may be updated based on patient status, additional functional criteria and insurance authorization.  Follow Up Recommendations  Skilled nursing-short term rehab (<3 hours/day) Can patient physically be transported by private vehicle: No   Assistance Recommended at Discharge Frequent or constant Supervision/Assistance  Patient can return home with the following A lot of help with walking and/or transfers;A lot of help with bathing/dressing/bathroom;Help with stairs or ramp for entrance   Equipment Recommendations  None recommended by PT    Recommendations for Other Services       Precautions / Restrictions Precautions Precautions: Fall Restrictions Weight Bearing Restrictions: No     Mobility  Bed Mobility               General bed mobility comments: not addressed    Transfers                   General transfer comment: patient declined standing/ambulation but was agreeable to exercise for strengthening    Ambulation/Gait                   Stairs             Wheelchair Mobility    Modified Rankin (Stroke Patients Only)       Balance                                            Cognition Arousal/Alertness: Awake/alert Behavior During Therapy: WFL  for tasks assessed/performed Overall Cognitive Status: Within Functional Limits for tasks assessed                                          Exercises General Exercises - Lower Extremity Ankle Circles/Pumps: AROM, Strengthening, Both, 10 reps, Seated Long Arc Quad: AAROM, Strengthening, Both, 10 reps, Seated Hip ABduction/ADduction: AAROM, Strengthening, Both, 10 reps, Seated Hip Flexion/Marching: AAROM, Strengthening, Both, 10 reps, Seated Other Exercises Other Exercises: verbal cues for exercises for strengthening.    General Comments        Pertinent Vitals/Pain Pain Assessment Pain Assessment: No/denies pain    Home Living                          Prior Function            PT Goals (current goals can now be found in the care plan section) Acute Rehab PT Goals Patient Stated Goal: to get to rehab PT Goal Formulation: With patient Time For Goal Achievement: 12/20/22 Potential to Achieve Goals: Good Progress towards PT goals: Progressing toward goals    Frequency    Min 2X/week  PT Plan Current plan remains appropriate    Co-evaluation              AM-PAC PT "6 Clicks" Mobility   Outcome Measure  Help needed turning from your back to your side while in a flat bed without using bedrails?: A Little Help needed moving from lying on your back to sitting on the side of a flat bed without using bedrails?: A Lot Help needed moving to and from a bed to a chair (including a wheelchair)?: A Little Help needed standing up from a chair using your arms (e.g., wheelchair or bedside chair)?: A Little Help needed to walk in hospital room?: A Little Help needed climbing 3-5 steps with a railing? : Total 6 Click Score: 15    End of Session   Activity Tolerance: Patient tolerated treatment well Patient left: in chair;with call bell/phone within reach;with chair alarm set Nurse Communication: Mobility status PT Visit Diagnosis:  Unsteadiness on feet (R26.81);Muscle weakness (generalized) (M62.81)     Time: 5681-2751 PT Time Calculation (min) (ACUTE ONLY): 10 min  Charges:  $Therapeutic Exercise: 8-22 mins                     Minna Merritts, PT, MPT    Percell Locus 12/12/2022, 1:43 PM

## 2022-12-12 NOTE — Progress Notes (Signed)
[facility] Progress Note  Patient: Carla Huerta    FTD:[id number]  DOA: 12/04/2022     Date of Service: the patient was seen and examined on 12/12/2022  Chief Complaint  Patient presents with   Emesis   Principal Problem:   Hepatic encephalopathy ([place]) Active Problems:   Dyslipidemia   Depression   Liver cirrhosis secondary to NASH (nonalcoholic steatohepatitis) (HCC)   Obesity, morbid, BMI 50 or higher (HCC)   Gout   Peripheral neuropathy   Obstructive sleep apnea   Metabolic acidosis   Thrombocytopenia, acquired ([place])   Chronic kidney disease, stage 3b (HCC)   Chronic diastolic CHF (congestive heart failure) ([place])   COPD (chronic obstructive pulmonary disease) ([place])   Essential hypertension   Hypercalcemia  Brief hospital course: Carla Huerta is a 72 y.o. female with medical history significant for COPD, CHF, depression, GERD, hypertension and known alcoholic hepatosteatosis, as well as stage III chronic kidney disease, who presented to the emergency room with acute onset of vomiting since this morning at her SNF.  Upon arriving the hospital, was very confused, vital signs are stable, no leukocytosis.  She had a profound elevation of ammonia level, was diagnosed with hepatic encephalopathy.  Given fluids, lactulose and rifaximin.    Assessment and Plan: Hepatic encephalopathy. 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 Continue lactulose 30 g 3 times a day and rifaximin 550 mg p.o. twice daily Ammonia level 72--95 still high Continue monitor ammonia level, patient's mental status is back to her baseline Patient's daughter stated that her condition deteriorates quickly with high ammonia level and it fluctuates a lot so she was not comfortable to discharge her today. Lasix and spironolactone were held because of dehydration, we will resume on discharge 2/1 patient had no BM for past 2 days, Dulcolax suppository was given and then she had  BM x 1.  We will continue lactulose, Dulcolax 10 mg p.o. nightly x 1 dose ordered if no BM x 2 today   Hypercalcemia. Could be secondary to dehydration from loose stool. PTH level is 8 which is low Discontinued vitamin D supplement for now, avoid oral calcium intake.  Chronic kidney disease stage IIIb. Acute kidney injury ruled out. Metabolic acidosis, resolved CO2 23 WNL Condition improved, renal function back to baseline.     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 Stable   COPD. Chronic diastolic congestive heart failure. Essential hypertension. Conditions are stable, continue to follow. Will restart diuretics on discharge   Morbid obesity with BMI 51.42 kg/m.  Obstructive sleep apnea. Diet exercise advised. CPAP while asleep.     Diet: Heart healthy diet DVT Prophylaxis: Subcutaneous Lovenox   Advance goals of care discussion: Full code  Family Communication: family was not present at bedside, at the time of interview.  The pt provided permission to discuss medical plan with the family. Opportunity was given to ask question and all questions were answered satisfactorily.  1/31 d/w patient's daughter over the phone  Disposition:  Pt is from Home, admitted with hepatic encephalopathy, still has high ammonia level, which precludes a safe discharge. Discharge to SNF, when clinically stable, still has high ammonia level, may discharge in 1 to 2 days..  Subjective: No significant events overnight, patient is AOx3, no BM for the past 2 days but she moved bowels after suppository given in the morning.  Denied any nausea vomiting, no abdominal pain.  No any other active issues.   Physical Exam: General: NAD, sitting comfortably in  the recliner.   Appear in no distress, affect appropriate Eyes: PERRLA ENT: Oral Mucosa Clear, moist  Neck: no JVD,  Cardiovascular: S1 and S2 Present, no Murmur,  Respiratory: good respiratory effort, Bilateral Air entry equal  and Decreased, no Crackles, no wheezes Abdomen: Bowel Sound present, Soft, obese and no tenderness,  Skin: no rashes Extremities: no Pedal edema, no calf tenderness Neurologic: without any new focal findings Gait not checked due to patient safety concerns  Vitals:   12/11/22 0744 12/11/22 1541 12/12/22 0026 12/12/22 0739  BP: 122/61 116/63 118/64 (!) 120/56  Pulse: 90 90 88 92  Resp: 14 19 17 18   Temp: 98.5 F (36.9 C) 98.3 F (36.8 C) 97.7 F (36.5 C) 98.9 F (37.2 C)  TempSrc:  Oral  Oral  SpO2: 94% 99% 95% 95%  Weight:      Height:        Intake/Output Summary (Last 24 hours) at 12/12/2022 1158 Last data filed at 12/12/2022 0600 Gross per 24 hour  Intake --  Output 950 ml  Net -950 ml   Filed Weights   12/04/22 2025  Weight: (!) 140.2 kg    Data Reviewed: I have personally reviewed and interpreted daily labs, tele strips, imagings as discussed above. I reviewed all nursing notes, pharmacy notes, vitals, pertinent old records I have discussed plan of care as described above with RN and patient/family.  CBC: Recent Labs  Lab 12/11/22 0300 12/12/22 0548  WBC 5.4 4.6  HGB 10.6* 11.0*  HCT 34.0* 35.6*  MCV 88.8 89.7  PLT 96* 91*   Basic Metabolic Panel: Recent Labs  Lab 12/06/22 0421 12/09/22 0232 12/10/22 0208 12/10/22 1143 12/11/22 0300 12/12/22 0548  NA 137 136 132*  --  133* 136  K 4.0 4.4 4.2  --  4.7 4.4  CL 105 101 100  --  103 103  CO2 27 22 24   --  23 24  GLUCOSE 87 109* 101*  --  94 91  BUN 36* 31* 31*  --  31* 33*  CREATININE 1.67* 1.57* 1.47*  --  1.36* 1.22*  CALCIUM 9.9 11.0* 10.5* 11.0* 10.9* 10.7*  MG 2.1 2.0 2.1  --   --  2.1  PHOS  --   --   --   --   --  3.1    Studies: No results found.  Scheduled Meds:  allopurinol  100 mg Oral Daily   aspirin  81 mg Oral QHS   atorvastatin  40 mg Oral Daily   enoxaparin (LOVENOX) injection  0.5 mg/kg Subcutaneous Q24H   ferrous sulfate  325 mg Oral Q breakfast   fesoterodine  4 mg Oral  Daily   gabapentin  600 mg Oral QHS   imipramine  100 mg Oral QHS   lactulose  30 g Oral TID   pantoprazole  40 mg Oral Daily   rifaximin  550 mg Oral BID   Continuous Infusions: PRN Meds: acetaminophen **OR** acetaminophen, albuterol, magnesium hydroxide, ondansetron **OR** ondansetron (ZOFRAN) IV, traZODone  Time spent: 35 minutes  Author: Val Riles. MD [facility] 12/12/2022 11:58 AM  To reach On-call, see care teams to locate the attending and reach out to them via [email or web address]. If 7PM-7AM, please contact night-coverage If you still have difficulty reaching the attending provider, please page the [facility] (Director on Call) for [facility] on amion for assistance.

## 2022-12-13 DIAGNOSIS — K7682 Hepatic encephalopathy: Secondary | ICD-10-CM | POA: Diagnosis not present

## 2022-12-13 LAB — BASIC METABOLIC PANEL
Anion gap: 6 (ref 5–15)
BUN: 31 mg/dL — ABNORMAL HIGH (ref 8–23)
CO2: 24 mmol/L (ref 22–32)
Calcium: 10.5 mg/dL — ABNORMAL HIGH (ref 8.9–10.3)
Chloride: 102 mmol/L (ref 98–111)
Creatinine, Ser: 1.22 mg/dL — ABNORMAL HIGH (ref 0.44–1.00)
GFR, Estimated: 48 mL/min — ABNORMAL LOW (ref >60)
Glucose, Bld: 82 mg/dL (ref 70–99)
Potassium: 4.5 mmol/L (ref 3.5–5.1)
Sodium: 132 mmol/L — ABNORMAL LOW (ref 135–145)

## 2022-12-13 LAB — CBC
HCT: 34.1 % — ABNORMAL LOW (ref 36.0–46.0)
Hemoglobin: 10.6 g/dL — ABNORMAL LOW (ref 12.0–15.0)
MCH: 27.6 pg (ref 26.0–34.0)
MCHC: 31.1 g/dL (ref 30.0–36.0)
MCV: 88.8 fL (ref 80.0–100.0)
Platelets: 98 10*3/uL — ABNORMAL LOW (ref 150–400)
RBC: 3.84 MIL/uL — ABNORMAL LOW (ref 3.87–5.11)
RDW: 22.6 % — ABNORMAL HIGH (ref 11.5–15.5)
WBC: 4.5 10*3/uL (ref 4.0–10.5)
nRBC: 0 % (ref 0.0–0.2)

## 2022-12-13 LAB — PHOSPHORUS: Phosphorus: 3.5 mg/dL (ref 2.5–4.6)

## 2022-12-13 LAB — MAGNESIUM: Magnesium: 2 mg/dL (ref 1.7–2.4)

## 2022-12-13 LAB — AMMONIA: Ammonia: 68 umol/L — ABNORMAL HIGH (ref 9–35)

## 2022-12-13 MED ORDER — RIFAXIMIN 550 MG PO TABS: 550.0000 mg | ORAL_TABLET | Freq: Two times a day (BID) | ORAL | Status: DC

## 2022-12-13 MED ORDER — BISACODYL 5 MG PO TBEC: 10.0000 mg | DELAYED_RELEASE_TABLET | Freq: Every day | ORAL | 0 refills | Status: DC

## 2022-12-13 MED ORDER — BISACODYL 10 MG RE SUPP: 10.0000 mg | RECTAL | 0 refills | Status: DC | PRN

## 2022-12-13 NOTE — Progress Notes (Signed)
CSW called Tammy, [facility], to see if they will have a bed available for pt today.  Patient was initially going to d/c on 1/31, however, she hasn't been medically ready.  Pt auth extended until midnight 2/4.     CSW reached out to MD this morning for an update and to give time that d/c summary needed by 1pm if pt is going to d/c today.  CSW also asked that, if pt isn't d/c'ing to let CSW know so that facility can be informed.          9:56  Dr. Dwyane Dee stated that pt will d/c today.  [facility] today.

## 2022-12-13 NOTE — TOC Transition Note (Signed)
Transition of Care [facility]) - CM/SW Discharge Note   Patient Details  Name: Carla Huerta MRN: [id number] Date of Birth: 1951-10-22  [facility] of [facility]) CM/SW Contact:  Quin Hoop, LCSW Phone Number: 12/13/2022, 12:36 PM   Clinical Narrative:     Patient to be d/c'ed today to [facility].  Patient and family agreeable to plans will transport via ems RN to call report [phone].    Final next level of care: Skilled Nursing Facility Barriers to Discharge: No Barriers Identified   Patient Goals and CMS Choice CMS Medicare.gov Compare Post Acute Care list provided to:: Patient Choice offered to / list presented to : Patient  Discharge Placement  [facility]              Patient chooses bed at: [facility] [place] Patient to be transferred to facility by: [facility] Name of family member notified: Jeneen Rinks, husband,  [phone] Patient and family notified of of transfer: 12/13/22  Discharge Plan and Services Additional resources added to the After Visit Summary for                                       Social Determinants of Health (SDOH) Interventions SDOH Screenings   Food Insecurity: No Food Insecurity (11/24/2022)  Housing: Low Risk  (11/24/2022)  Transportation Needs: No Transportation Needs (11/24/2022)  Utilities: Not At Risk (11/24/2022)  Tobacco Use: High Risk (12/04/2022)     Readmission Risk Interventions     No data to display

## 2022-12-13 NOTE — Plan of Care (Signed)
Patient discharged per MD orders at this time. All dc instructions,education and medications reviewed with the patient.Patient expressed understanding and will comply with discharge instructions.follow up appointments was also communicated to the Pt.no verbal c/o or any ssx of distress.Pt was discharged to the [facility]. report was called to staff nurse Ms Apolonio Schneiders before dc.Pt was transported by 2 [facility] personnel on a stretcher.

## 2022-12-13 NOTE — Discharge Summary (Signed)
[facility] Discharge Summary   Patient: Carla Huerta HYW:[id number]  PCP: Denton Lank, MD  Date of admission: 12/04/2022   Date of discharge:  12/13/2022     Discharge Diagnoses:  Principal Problem:   Hepatic encephalopathy ([place]) Active Problems:   Dyslipidemia   Depression   Liver cirrhosis secondary to NASH (nonalcoholic steatohepatitis) (HCC)   Obesity, morbid, BMI 50 or higher (HCC)   Gout   Peripheral neuropathy   Obstructive sleep apnea   Metabolic acidosis   Thrombocytopenia, acquired (HCC)   Chronic kidney disease, stage 3b (HCC)   Chronic diastolic CHF (congestive heart failure) ([place])   COPD (chronic obstructive pulmonary disease) ([place])   Essential hypertension   Hypercalcemia   Admitted From: Home Disposition:  SNF   Recommendations for Outpatient Follow-up:  Follow-up with PCP, patient will be seen by an MD in 1 to 2 days. Patient's ammonia level is fluctuating could be due to constipation, patient is taking lactulose but she is still constipated so we will add Dulcolax.  Follow up LABS/TEST:  BMP in 1-2 weeks to check renal functions and calcium level   Diet recommendation: Cardiac diet  Activity: The patient is advised to gradually reintroduce usual activities, as tolerated  Discharge Condition: stable  Code Status: Full code   History of present illness: As per the H and P dictated on admission Hospital Course:  ADASIA HOAR is a 72 y.o. female with medical history significant for COPD, CHF, depression, GERD, hypertension and known alcoholic hepatosteatosis, as well as stage III chronic kidney disease, who presented to the emergency room with acute onset of vomiting since this morning at her SNF.  Upon arriving the hospital, was very confused, vital signs are stable, no leukocytosis.  She had a profound elevation of ammonia level, was diagnosed with hepatic encephalopathy.  Given fluids, lactulose and rifaximin.   Assessment and Plan: # Hepatic  encephalopathy. # Liver cirrhosis with ascites secondary to nonalcoholic steatohepatitis. Continue lactulose 30 g 3 times a day and rifaximin 550 mg p.o. twice daily Ammonia level 72--95 ---65 trending down.  Patient's mental status is back to her baseline 2/1 patient had no BM for past 2 days, Dulcolax suppository was given and then she had BM x 2 yesterday.  Continue lactulose and titrate dose for 1-2 BM per day and continue Dulcolax 10 mg p.o. nightly if no BM x 2 in a day and may use Dulcolax suppository if needed.  Resumed Lasix and spironolactone home dose on discharge.  Repeat BMP after 1-2 weeks to check renal functions. # Hypercalcemia, unknown cause, could be due to dehydration. Calcium level 10.5, PTH level 8 which is low.  Discontinued vitamin D supplement for now.  Repeat BMP after 1 to 2 weeks to check calcium level.  Avoid calcium supplement. # Chronic kidney disease stage IIIb. Acute kidney injury ruled out. Metabolic acidosis, resolved CO2 23 WNL. Condition improved, renal function back to baseline. 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 Stable # COPD stable, resumed albuterol as needed home med. # Chronic diastolic congestive heart failure, well compensated, no signs of volume overload.  Resumed Lasix and spironolactone home dose. # Essential hypertension, BP remained soft, continue to monitor and titrate medications accordingly. Morbid obesity with BMI 51.42 kg/m.  Obstructive sleep apnea. Diet exercise advised. CPAP while asleep.    Patient was seen by physical therapy, who recommended Therapy, which was arranged. On the day of the discharge the patient's vitals were stable, and no other  acute medical condition were reported by patient. the patient was felt safe to be discharge at SNF with Therapy.  Consultants: None Procedures: None  Discharge Exam: General: Appear in no distress, no Rash; Oral Mucosa Clear, moist. Cardiovascular: S1 and S2 Present,  no Murmur, Respiratory: normal respiratory effort, Bilateral Air entry present and no Crackles, no wheezes Abdomen: Bowel Sound present, Soft, obese and no tenderness, no hernia Extremities: no Pedal edema, no calf tenderness Neurology: alert and oriented to time, place, and person affect appropriate.  Filed Weights   12/04/22 2025  Weight: (!) 140.2 kg   Vitals:   12/12/22 2348 12/13/22 0906  BP: (!) 107/53 115/61  Pulse: 87 86  Resp: 18 17  Temp: 98 F (36.7 C) 97.7 F (36.5 C)  SpO2: 94% 96%    DISCHARGE MEDICATION: Allergies as of 12/13/2022       Reactions   Latex Rash   Local reaction where it touches her body        Medication List     STOP taking these medications    cholecalciferol 1000 units tablet Commonly known as: VITAMIN D       TAKE these medications    albuterol 108 (90 Base) MCG/ACT inhaler Commonly known as: VENTOLIN HFA Inhale 2 puffs into the lungs every 4 (four) hours as needed for wheezing or shortness of breath.   alendronate 70 MG tablet Commonly known as: FOSAMAX Take 70 mg by mouth every Wednesday.   allopurinol 100 MG tablet Commonly known as: ZYLOPRIM Take 100 mg by mouth daily.   aspirin 81 MG chewable tablet Chew 81 mg by mouth at bedtime.   atorvastatin 40 MG tablet Commonly known as: LIPITOR Take 40 mg by mouth daily.   bisacodyl 5 MG EC tablet Commonly known as: DULCOLAX Take 2 tablets (10 mg total) by mouth at bedtime. Skip the dose if no constipation   bisacodyl 10 MG suppository Commonly known as: Dulcolax Place 1 suppository (10 mg total) rectally as needed for severe constipation.   ferrous sulfate 325 (65 FE) MG EC tablet Take 325 mg by mouth daily with breakfast.   furosemide 40 MG tablet Commonly known as: Lasix Take 1 tablet (40 mg total) by mouth daily.   gabapentin 300 MG capsule Commonly known as: NEURONTIN Take 2 capsules (600 mg total) by mouth at bedtime.   imipramine 50 MG  tablet Commonly known as: TOFRANIL Take 100 mg by mouth at bedtime.   lactulose 10 GM/15ML solution Commonly known as: CHRONULAC Take 20 g (30 mL) 1-3 times a day for a goal of 1-2 bowel movements a day.   omeprazole 40 MG capsule Commonly known as: PRILOSEC Take 40 mg by mouth daily.   rifaximin 550 MG Tabs tablet Commonly known as: XIFAXAN Take 1 tablet (550 mg total) by mouth 2 (two) times daily.   spironolactone 50 MG tablet Commonly known as: ALDACTONE Take 1 tablet (50 mg total) by mouth daily.   tolterodine 4 MG 24 hr capsule Commonly known as: DETROL LA Take 4 mg by mouth at bedtime.       Allergies  Allergen Reactions   Latex Rash    Local reaction where it touches her body   Discharge Instructions     Call MD for:  difficulty breathing, headache or visual disturbances   Complete by: As directed    Call MD for:  extreme fatigue   Complete by: As directed    Call MD for:  persistant dizziness  or light-headedness   Complete by: As directed    Call MD for:  persistant nausea and vomiting   Complete by: As directed    Call MD for:  severe uncontrolled pain   Complete by: As directed    Call MD for:  temperature >100.4   Complete by: As directed    Diet - low sodium heart healthy   Complete by: As directed    Discharge instructions   Complete by: As directed    Follow-up with PCP, patient will be seen by an MD in 1 to 2 days.  Patient's ammonia level is fluctuating could be due to constipation, patient is taking lactulose but she is still constipated so we will add Dulcolax.   Increase activity slowly   Complete by: As directed        The results of significant diagnostics from this hospitalization (including imaging, microbiology, ancillary and laboratory) are listed below for reference.    Significant Diagnostic Studies: CT ABDOMEN PELVIS WO CONTRAST  Result Date: 12/04/2022 CLINICAL DATA:  Abdominal pain.  Acute nonlocalized EXAM: CT ABDOMEN AND  PELVIS WITHOUT CONTRAST TECHNIQUE: Multidetector CT imaging of the abdomen and pelvis was performed following the standard protocol without IV contrast. RADIATION DOSE REDUCTION: This exam was performed according to the departmental dose-optimization program which includes automated exposure control, adjustment of the mA and/or kV according to patient size and/or use of iterative reconstruction technique. COMPARISON:  None Available. FINDINGS: Lower chest: Lung bases are clear. Hepatobiliary: Liver has a nodular contour. Several gallstones noted in the neck of the gallbladder. No gallbladder distension. Common bile duct normal. Pancreas: Pancreas is normal. No ductal dilatation. No pancreatic inflammation. Spleen: Calcifications along the capsule of the spleen suggest prior trauma or infection. Acute findings. Adrenals/urinary tract: Adrenal glands and kidneys are normal. The ureters and bladder normal. Stomach/Bowel: The stomach, duodenum, and small bowel normal. The colon and rectosigmoid colon are normal. Vascular/Lymphatic: Abdominal aorta is normal caliber with atherosclerotic calcification. There is no retroperitoneal or periportal lymphadenopathy. No pelvic lymphadenopathy. Reproductive: Uterus and adnexa unremarkable. Other: No free fluid. Musculoskeletal: No aggressive osseous lesion. IMPRESSION: 1. No acute findings in the abdomen pelvis. 2. Cholelithiasis without evidence cholecystitis. 3. Nodular contour of the liver suggests cirrhosis. 4. Aortic atherosclerosis. Aortic Atherosclerosis (ICD10-I70.0). Electronically Signed   By: Genevive Bi M.D.   On: 12/04/2022 17:54   CT HEAD WO CONTRAST ( )  Result Date: 12/04/2022 CLINICAL DATA:  Confused.  Vomiting. EXAM: CT HEAD WITHOUT CONTRAST TECHNIQUE: Contiguous axial images were obtained from the base of the skull through the vertex without intravenous contrast. RADIATION DOSE REDUCTION: This exam was performed according to the departmental  dose-optimization program which includes automated exposure control, adjustment of the mA and/or kV according to patient size and/or use of iterative reconstruction technique. COMPARISON:  CT head 12/01/2022 FINDINGS: Brain: There is no acute intracranial hemorrhage, extra-axial fluid collection, or acute infarct Parenchymal volume is normal for age. The ventricles are normal in size. Gray-white differentiation is preserved. Patchy hypodensity in the supratentorial white matter likely reflects sequela of chronic small-vessel ischemic change The pituitary and suprasellar region are normal. There is no mass lesion. There is no mass effect or midline shift. Vascular: No hyperdense vessel or unexpected calcification. Skull: Normal. Negative for fracture or focal lesion. Sinuses/Orbits: The paranasal sinuses are clear. The globes and orbits are unremarkable. Other: None. IMPRESSION: Stable noncontrast head CT with no acute intracranial pathology. Electronically Signed   By: Selena Lesser.D.  On: 12/04/2022 17:46   CT Head Wo Contrast  Result Date: 12/01/2022 CLINICAL DATA:  Minor head trauma.  Weakness and slurred speech EXAM: CT HEAD WITHOUT CONTRAST TECHNIQUE: Contiguous axial images were obtained from the base of the skull through the vertex without intravenous contrast. RADIATION DOSE REDUCTION: This exam was performed according to the departmental dose-optimization program which includes automated exposure control, adjustment of the mA and/or kV according to patient size and/or use of iterative reconstruction technique. COMPARISON:  11/22/2022 FINDINGS: Brain: No evidence of acute infarction, hemorrhage, hydrocephalus, extra-axial collection or mass lesion/mass effect. Chronic small vessel ischemia in the cerebral white matter. Vascular: No hyperdense vessel or unexpected calcification. Skull: Normal. Negative for fracture or focal lesion. Sinuses/Orbits: No acute finding. IMPRESSION: No acute or interval  finding. Electronically Signed   By: Tiburcio Pea M.D.   On: 12/01/2022 12:08   DG Chest 2 View  Result Date: 12/01/2022 CLINICAL DATA:  Multiple falls. EXAM: CHEST - 2 VIEW COMPARISON:  Chest radiograph 11/22/2022 FINDINGS: Monitoring leads overlie the patient. Stable cardiomegaly. Aortic atherosclerosis. No large area pulmonary consolidation. No pleural effusion or pneumothorax. Thoracic spine degenerative changes. IMPRESSION: Cardiomegaly. No acute cardiopulmonary process. Electronically Signed   By: Annia Belt M.D.   On: 12/01/2022 11:33   DG Chest Port 1 View  Result Date: 11/22/2022 CLINICAL DATA:  Weakness and fall. EXAM: PORTABLE CHEST 1 VIEW COMPARISON:  Chest x-ray dated October 30, 2022. FINDINGS: The patient is rotated to the left. Unchanged cardiomegaly. Increased pulmonary vascular congestion and diffuse interstitial thickening. Low lung volumes with bibasilar atelectasis. No consolidation, pneumothorax, or large pleural effusion. No acute osseous abnormality. IMPRESSION: 1. Mild interstitial pulmonary edema. Electronically Signed   By: Obie Dredge M.D.   On: 11/22/2022 13:16   CT HEAD WO CONTRAST  Result Date: 11/22/2022 CLINICAL DATA:  Fall.  Increased weakness. EXAM: CT HEAD WITHOUT CONTRAST CT CERVICAL SPINE WITHOUT CONTRAST TECHNIQUE: Multidetector CT imaging of the head and cervical spine was performed following the standard protocol without intravenous contrast. Multiplanar CT image reconstructions of the cervical spine were also generated. RADIATION DOSE REDUCTION: This exam was performed according to the departmental dose-optimization program which includes automated exposure control, adjustment of the mA and/or kV according to patient size and/or use of iterative reconstruction technique. COMPARISON:  Head CT 05/03/2022. FINDINGS: CT HEAD FINDINGS Brain: No acute intracranial hemorrhage. Gray-white differentiation is preserved. No mass effect or midline shift. Patchy  hypoattenuation in the cerebral white matter, most consistent with chronic small vessel disease. Ventricles and sulci are within normal limits for age. No extra-axial collections. Basilar cisterns are patent. Vascular: No hyperdense vessel sign. Skull: No calvarial fracture.  Skull base is unremarkable. Sinuses/Orbits: Paranasal sinuses and mastoid air cells are well aerated. Orbits are unremarkable. Other: No soft tissue abnormalities. CT CERVICAL SPINE FINDINGS Alignment: Normal.  No traumatic listhesis. Skull base and vertebrae: No acute fracture or suspicious bone lesion. Photopenia throughout the cervical spine mildly limits evaluation for subtle nondisplaced fracture. Soft tissues and spinal canal: No prevertebral edema or visible epidural hematoma. Disc levels: No high-grade spinal canal stenosis. Mild upper cervical facet arthropathy. Upper chest: Unremarkable. Other: None. IMPRESSION: 1. No acute intracranial injury. 2. No acute fracture or traumatic listhesis of the cervical spine. Electronically Signed   By: Orvan Falconer M.D   On: 11/22/2022 11:54   CT Cervical Spine Wo Contrast  Result Date: 11/22/2022 CLINICAL DATA:  Fall.  Increased weakness. EXAM: CT HEAD WITHOUT CONTRAST CT CERVICAL SPINE WITHOUT  CONTRAST TECHNIQUE: Multidetector CT imaging of the head and cervical spine was performed following the standard protocol without intravenous contrast. Multiplanar CT image reconstructions of the cervical spine were also generated. RADIATION DOSE REDUCTION: This exam was performed according to the departmental dose-optimization program which includes automated exposure control, adjustment of the mA and/or kV according to patient size and/or use of iterative reconstruction technique. COMPARISON:  Head CT 05/03/2022. FINDINGS: CT HEAD FINDINGS Brain: No acute intracranial hemorrhage. Gray-white differentiation is preserved. No mass effect or midline shift. Patchy hypoattenuation in the cerebral white  matter, most consistent with chronic small vessel disease. Ventricles and sulci are within normal limits for age. No extra-axial collections. Basilar cisterns are patent. Vascular: No hyperdense vessel sign. Skull: No calvarial fracture.  Skull base is unremarkable. Sinuses/Orbits: Paranasal sinuses and mastoid air cells are well aerated. Orbits are unremarkable. Other: No soft tissue abnormalities. CT CERVICAL SPINE FINDINGS Alignment: Normal.  No traumatic listhesis. Skull base and vertebrae: No acute fracture or suspicious bone lesion. Photopenia throughout the cervical spine mildly limits evaluation for subtle nondisplaced fracture. Soft tissues and spinal canal: No prevertebral edema or visible epidural hematoma. Disc levels: No high-grade spinal canal stenosis. Mild upper cervical facet arthropathy. Upper chest: Unremarkable. Other: None. IMPRESSION: 1. No acute intracranial injury. 2. No acute fracture or traumatic listhesis of the cervical spine. Electronically Signed   By: Emmit Alexanders M.D   On: 11/22/2022 11:54    Microbiology: No results found for this or any previous visit (from the past 240 hour(s)).   Labs: CBC: Recent Labs  Lab 12/11/22 0300 12/12/22 0548 12/13/22 0845  WBC 5.4 4.6 4.5  HGB 10.6* 11.0* 10.6*  HCT 34.0* 35.6* 34.1*  MCV 88.8 89.7 88.8  PLT 96* 91* 98*   Basic Metabolic Panel: Recent Labs  Lab 12/09/22 0232 12/10/22 0208 12/10/22 1143 12/11/22 0300 12/12/22 0548 12/13/22 0845  NA 136 132*  --  133* 136 132*  K 4.4 4.2  --  4.7 4.4 4.5  CL 101 100  --  103 103 102  CO2 22 24  --  23 24 24   GLUCOSE 109* 101*  --  94 91 82  BUN 31* 31*  --  31* 33* 31*  CREATININE 1.57* 1.47*  --  1.36* 1.22* 1.22*  CALCIUM 11.0* 10.5* 11.0* 10.9* 10.7* 10.5*  MG 2.0 2.1  --   --  2.1 2.0  PHOS  --   --   --   --  3.1 3.5   Liver Function Tests: No results for input(s): "AST", "ALT", "ALKPHOS", "BILITOT", "PROT", "ALBUMIN" in the last 168 hours. No results for  input(s): "LIPASE", "AMYLASE" in the last 168 hours. Recent Labs  Lab 12/09/22 0232 12/10/22 0208 12/11/22 0300 12/12/22 0548 12/13/22 0845  AMMONIA 77* 62* 72* 95* 68*   Cardiac Enzymes: No results for input(s): "CKTOTAL", "CKMB", "CKMBINDEX", "TROPONINI" in the last 168 hours. BNP (last 3 results) Recent Labs    10/30/22 1212 11/23/22 0544 12/01/22 1112  BNP 155.4* 15.7 39.6   CBG: No results for input(s): "GLUCAP" in the last 168 hours.  Time spent: 35 minutes  Signed:  Val Riles  [facility]  12/13/2022 11:11 AM

## 2022-12-13 NOTE — Plan of Care (Signed)

## 2022-12-18 ENCOUNTER — Telehealth: Payer: Self-pay | Admitting: Family

## 2022-12-18 ENCOUNTER — Ambulatory Visit: Payer: Medicare HMO | Admitting: Family

## 2022-12-18 NOTE — Telephone Encounter (Signed)
Patient did not show for her [facility] appointment on 12/18/22. Will attempt to reschedule.

## 2022-12-18 NOTE — Progress Notes (Deleted)
Patient ID: Carla Huerta, female    DOB: 12/16/1950, 72 y.o.   MRN: [id number]  HPI  Ms Stenberg is a 72 y/o female with a history of HTN, CKD, anemia, COPD, depression, diaphragm paralysis, gout, hyponatremia, cirrhosis d/t NASH, sleep apnea, previous tobacco use and chronic heart failure.   Echo report from 10/31/22 showed an EF of 60-65% with trivial MR.   Admitted 12/04/22 due to vomiting & AMS due to hepatic encephalopathy & liver cirrhosis with ascites secondary to nonalcoholic steatohepatitis. Lactulose given. Was in the ED 12/01/22 due to mechanical fall. Admitted 11/22/22 due to weakness and SOB due to HF exacerbation. Diuresed. Was in the ED 10/30/22 due to acute on chronic heart failure.   She presents today for a follow-up visit with a chief complaint of   Past Medical History:  Diagnosis Date   Anemia    CHF (congestive heart failure) (HCC)    COPD (chronic obstructive pulmonary disease) (HCC)    Cough    Depression    Diaphragm paralysis    Gout    Hypertension    Hyponatremia    Insomnia    Liver disease    Nonalcoholic hepatosteatosis    PONV (postoperative nausea and vomiting)    Renal disorder    Sleep apnea    Splenic laceration    Vertigo    Past Surgical History:  Procedure Laterality Date   APPENDECTOMY     when she was 32 yearsa old   BREAST BIOPSY Left 06/17/2019   affirm bx distortion with calcs, coil clip, ADH   BREAST EXCISIONAL BIOPSY Left 09/23/2019   neg   BREAST LUMPECTOMY Left 09/23/2019   ADH   BREAST LUMPECTOMY WITH NEEDLE LOCALIZATION Left 09/23/2019   Procedure: LEFT BREAST LUMPECTOMY WITH NEEDLE LOCALIZATION;  Surgeon: Jovita Kussmaul, MD;  Location: [facility];  Service: General;  Laterality: Left;   COLONOSCOPY WITH PROPOFOL N/A 10/01/2016   Procedure: COLONOSCOPY WITH PROPOFOL;  Surgeon: Lucilla Lame, MD;  Location: [facility];  Service: Endoscopy;  Laterality: N/A;   ESOPHAGOGASTRODUODENOSCOPY (EGD) WITH PROPOFOL N/A 10/01/2016    Procedure: ESOPHAGOGASTRODUODENOSCOPY (EGD) WITH PROPOFOL;  Surgeon: Lucilla Lame, MD;  Location: [facility] END[facility];  Service: Endoscopy;  Laterality: N/A;   LIVER BIOPSY     2015   REPLACEMENT TOTAL KNEE Right 2002   TONSILLECTOMY AND ADENOIDECTOMY     when pt was 72 years old   Family History  Problem Relation Age of Onset   Breast cancer Sister    Breast cancer Paternal Grandmother    Breast cancer Other    Social History   Tobacco Use   Smoking status: Former    Years: 1.00    Types: Cigarettes   Smokeless tobacco: Current  Substance Use Topics   Alcohol use: No   Allergies  Allergen Reactions   Latex Rash    Local reaction where it touches her body    Review of Systems  Constitutional:  Positive for fatigue (easily). Negative for appetite change.  HENT:  Negative for congestion, postnasal drip and sore throat.   Eyes: Negative.   Respiratory:  Positive for shortness of breath (easily). Negative for cough and chest tightness.   Cardiovascular:  Positive for palpitations (at times) and leg swelling (improving). Negative for chest pain.  Gastrointestinal:  Positive for abdominal distention. Negative for abdominal pain.  Endocrine: Negative.   Genitourinary: Negative.   Musculoskeletal:  Negative for back pain and neck pain.  Skin: Negative.   Allergic/Immunologic:  Negative.   Neurological:  Positive for dizziness (at times). Negative for light-headedness.  Hematological:  Negative for adenopathy. Does not bruise/bleed easily.  Psychiatric/Behavioral:  Positive for sleep disturbance (sleeping on 2 pillows with CPAP). Negative for dysphoric mood. The patient is not nervous/anxious.      Physical Exam Vitals and nursing note reviewed. Exam conducted with a chaperone present (daughter).  Constitutional:      Appearance: Normal appearance.  HENT:     Head: Normocephalic and atraumatic.  Cardiovascular:     Rate and Rhythm: Regular rhythm. Tachycardia present.   Pulmonary:     Effort: Pulmonary effort is normal. No respiratory distress.     Breath sounds: No wheezing or rales.  Abdominal:     General: There is distension.     Palpations: Abdomen is soft.  Musculoskeletal:        General: No tenderness.     Cervical back: Normal range of motion and neck supple.     Right lower leg: Edema (1+ pitting) present.     Left lower leg: Edema (1+ pitting) present.  Skin:    General: Skin is warm and dry.  Neurological:     General: No focal deficit present.     Mental Status: She is alert and oriented to person, place, and time.  Psychiatric:        Mood and Affect: Mood normal.        Behavior: Behavior normal.        Thought Content: Thought content normal.   Assessment & Plan:  1: Chronic heart failure with preserved ejection fraction without LVH/LAE- - NYHA class III - minimally fluid overloaded with edema - not weighing but does have scales; instructed to weigh daily and call for an overnight weight gain of > 2 pounds or a weekly weight gain of > 5 pounds - weight 306 pounds from last visit here 1 month ago - BMP today - if symptoms persists, discussed changing her diuretic to torsemide - not adding salt to her food - has history of recurrent UTI's so would not be candidate for SGLT2 - BNP 12/01/22 was 39.6  2: HTN- - BP  - sees PCP Posey Pronto) at [facility]; currently seeing PCP at SNF - BMP 12/13/22 showed sodium 132, potassium 4.5, creatinine 1.22 & GFR 48  3: COPD- - uses albuterol inhaler that she uses PRN  4: Cirrhosis secondary to NASH- - bilirubin 12/05/22 was 1.0 - on rifampin  5: OSA- - wears CPAP nightly   Medication bottles reviewed.

## 2023-01-13 ENCOUNTER — Ambulatory Visit: Payer: Medicare HMO | Admitting: Family

## 2023-01-22 ENCOUNTER — Ambulatory Visit: Payer: Medicare HMO | Admitting: Family

## 2023-01-23 ENCOUNTER — Ambulatory Visit (HOSPITAL_BASED_OUTPATIENT_CLINIC_OR_DEPARTMENT_OTHER): Payer: Medicare HMO | Admitting: Family

## 2023-01-23 ENCOUNTER — Other Ambulatory Visit
Admission: RE | Admit: 2023-01-23 | Discharge: 2023-01-23 | Disposition: A | Discharge status: Home / Self Care | Payer: Medicare HMO | Source: Ambulatory Visit | Attending: Family | Admitting: Family

## 2023-01-23 VITALS — BP 84/56 | HR 96 | Wt 288.1 lb

## 2023-01-23 DIAGNOSIS — I10 Essential (primary) hypertension: Secondary | ICD-10-CM

## 2023-01-23 DIAGNOSIS — G4733 Obstructive sleep apnea (adult) (pediatric): Secondary | ICD-10-CM

## 2023-01-23 DIAGNOSIS — K7581 Nonalcoholic steatohepatitis (NASH): Secondary | ICD-10-CM

## 2023-01-23 DIAGNOSIS — Z87440 Personal history of urinary (tract) infections: Secondary | ICD-10-CM | POA: Insufficient documentation

## 2023-01-23 DIAGNOSIS — J449 Chronic obstructive pulmonary disease, unspecified: Secondary | ICD-10-CM

## 2023-01-23 DIAGNOSIS — I5032 Chronic diastolic (congestive) heart failure: Secondary | ICD-10-CM | POA: Insufficient documentation

## 2023-01-23 DIAGNOSIS — K7460 Unspecified cirrhosis of liver: Secondary | ICD-10-CM | POA: Insufficient documentation

## 2023-01-23 DIAGNOSIS — Z87891 Personal history of nicotine dependence: Secondary | ICD-10-CM | POA: Insufficient documentation

## 2023-01-23 DIAGNOSIS — I130 Hypertensive heart and chronic kidney disease with heart failure and stage 1 through stage 4 chronic kidney disease, or unspecified chronic kidney disease: Secondary | ICD-10-CM | POA: Insufficient documentation

## 2023-01-23 DIAGNOSIS — Z79899 Other long term (current) drug therapy: Secondary | ICD-10-CM | POA: Insufficient documentation

## 2023-01-23 LAB — BASIC METABOLIC PANEL
Anion gap: 14 (ref 5–15)
BUN: 42 mg/dL — ABNORMAL HIGH (ref 8–23)
CO2: 23 mmol/L (ref 22–32)
Calcium: 10 mg/dL (ref 8.9–10.3)
Chloride: 100 mmol/L (ref 98–111)
Creatinine, Ser: 2.17 mg/dL — ABNORMAL HIGH (ref 0.44–1.00)
GFR, Estimated: 24 mL/min — ABNORMAL LOW (ref >60)
Glucose, Bld: 99 mg/dL (ref 70–99)
Potassium: 3.7 mmol/L (ref 3.5–5.1)
Sodium: 137 mmol/L (ref 135–145)

## 2023-01-23 NOTE — Patient Instructions (Signed)
Decrease spironolactone to 1/2 tablet every day

## 2023-01-23 NOTE — Progress Notes (Signed)
Patient ID: Carla Huerta, female    DOB: 05/10/1951, 72 y.o.   MRN: [id number]  HPI  Carla Huerta is a 72 y/o female with a history of HTN, CKD, anemia, COPD, depression, diaphragm paralysis, gout, hyponatremia, cirrhosis d/t NASH, sleep apnea, previous tobacco use and chronic heart failure.   Echo report from 10/31/22 showed an EF of 60-65% with trivial MR.   Admitted 12/04/22 due to vomiting and confusion with profound elevated ammonia level due to hepatic encephalopathy. Given fluids, lactulose and rifaximin. Was in the ED 12/01/22 due to a fall. Admitted 11/22/22 due to acute pulmonary edema. IV diuresed  She presents today for a HF f/u visit with a chief complaint of moderate fatigue with little exertion. Chronic in nature. Has SOB, pedal edema & intermittent dizziness with this. Denies difficulty sleeping, abdominal distention, palpitations, chest pain, cough or weight gain.   Did not bring her medications/ list and can't say with certainty what she is taking.   Past Medical History:  Diagnosis Date   Anemia    CHF (congestive heart failure) (HCC)    COPD (chronic obstructive pulmonary disease) (HCC)    Cough    Depression    Diaphragm paralysis    Gout    Hypertension    Hyponatremia    Insomnia    Liver disease    Nonalcoholic hepatosteatosis    PONV (postoperative nausea and vomiting)    Renal disorder    Sleep apnea    Splenic laceration    Vertigo    Past Surgical History:  Procedure Laterality Date   APPENDECTOMY     when she was 59 yearsa old   BREAST BIOPSY Left 06/17/2019   affirm bx distortion with calcs, coil clip, ADH   BREAST EXCISIONAL BIOPSY Left 09/23/2019   neg   BREAST LUMPECTOMY Left 09/23/2019   ADH   BREAST LUMPECTOMY WITH NEEDLE LOCALIZATION Left 09/23/2019   Procedure: LEFT BREAST LUMPECTOMY WITH NEEDLE LOCALIZATION;  Surgeon: Jovita Kussmaul, MD;  Location: [facility];  Service: General;  Laterality: Left;   COLONOSCOPY WITH PROPOFOL N/A  10/01/2016   Procedure: COLONOSCOPY WITH PROPOFOL;  Surgeon: Lucilla Lame, MD;  Location: [facility];  Service: Endoscopy;  Laterality: N/A;   ESOPHAGOGASTRODUODENOSCOPY (EGD) WITH PROPOFOL N/A 10/01/2016   Procedure: ESOPHAGOGASTRODUODENOSCOPY (EGD) WITH PROPOFOL;  Surgeon: Lucilla Lame, MD;  Location: [facility];  Service: Endoscopy;  Laterality: N/A;   LIVER BIOPSY     2015   REPLACEMENT TOTAL KNEE Right 2002   TONSILLECTOMY AND ADENOIDECTOMY     when pt was 72 years old   Family History  Problem Relation Age of Onset   Breast cancer Sister    Breast cancer Paternal Grandmother    Breast cancer Other    Social History   Tobacco Use   Smoking status: Former    Years: 1    Types: Cigarettes   Smokeless tobacco: Current  Substance Use Topics   Alcohol use: No   Allergies  Allergen Reactions   Latex Rash    Local reaction where it touches her body   Prior to Admission medications   Medication Sig Start Date End Date Taking? Authorizing Provider  albuterol (PROVENTIL HFA;VENTOLIN HFA) 108 (90 Base) MCG/ACT inhaler Inhale 2 puffs into the lungs every 4 (four) hours as needed for wheezing or shortness of breath.   Yes [provider]  alendronate (FOSAMAX) 70 MG tablet Take 70 mg by mouth every Wednesday.    Yes [provider]  allopurinol (ZYLOPRIM) 100 MG tablet Take 100 mg by mouth daily.   Yes [provider]  aspirin 81 MG chewable tablet Chew 81 mg by mouth at bedtime.   Yes [provider]  atorvastatin (LIPITOR) 40 MG tablet Take 40 mg by mouth daily.    Yes [provider]  bisacodyl (DULCOLAX) 10 MG suppository Place 1 suppository (10 mg total) rectally as needed for severe constipation. 12/13/22  Yes Val Riles, MD  bisacodyl (DULCOLAX) 5 MG EC tablet Take 2 tablets (10 mg total) by mouth at bedtime. Skip the dose if no constipation 12/13/22  Yes Val Riles, MD  ferrous sulfate 325 (65 FE) MG EC tablet Take 325 mg by  mouth daily with breakfast.   Yes [provider]  furosemide (LASIX) 40 MG tablet Take 1 tablet (40 mg total) by mouth daily. 11/30/22 05/29/23 Yes Mercy Riding, MD  imipramine (TOFRANIL) 50 MG tablet Take 100 mg by mouth at bedtime.    Yes [provider]  lactulose (CHRONULAC) 10 GM/15ML solution Take 20 g (30 mL) 1-3 times a day for a goal of 1-2 bowel movements a day. 11/26/22  Yes Mercy Riding, MD  omeprazole (PRILOSEC) 40 MG capsule Take 40 mg by mouth daily.    Yes [provider]  spironolactone (ALDACTONE) 50 MG tablet Take 1 tablet (50 mg total) by mouth daily.  11/30/22  Yes Mercy Riding, MD  tolterodine (DETROL LA) 4 MG 24 hr capsule Take 4 mg by mouth at bedtime.   Yes [provider]  gabapentin (NEURONTIN) 300 MG capsule Take 2 capsules (600 mg total) by mouth at bedtime. 11/26/22 12/26/22  Mercy Riding, MD  rifaximin (XIFAXAN) 550 MG TABS tablet Take 1 tablet (550 mg total) by mouth 2 (two) times daily. 12/13/22 03/13/23  Val Riles, MD    Review of Systems  Constitutional:  Positive for fatigue (easily). Negative for appetite change.  HENT:  Negative for congestion, postnasal drip and sore throat.   Eyes: Negative.   Respiratory:  Positive for shortness of breath (easily). Negative for cough and chest tightness.   Cardiovascular:  Positive for leg swelling (improving). Negative for chest pain and palpitations.  Gastrointestinal:  Negative for abdominal distention and abdominal pain.  Endocrine: Negative.   Genitourinary: Negative.   Musculoskeletal:  Negative for back pain and neck pain.  Skin: Negative.   Allergic/Immunologic: Negative.   Neurological:  Positive for dizziness (at times). Negative for light-headedness.  Hematological:  Negative for adenopathy. Does not bruise/bleed easily.  Psychiatric/Behavioral:  Negative for dysphoric mood and sleep disturbance (sleeping on 2 pillows with CPAP). The patient is not nervous/anxious.     Vitals:   01/23/23 1337  BP: (!) 84/56  Pulse: 96  SpO2: 97%  Weight: 288 lb 2 oz (130.7 kg)   Wt Readings from Last 3 Encounters:  01/23/23 288 lb 2 oz (130.7 kg)  12/04/22 (!) 309 lb (140.2 kg)  11/25/22 298 lb 12.8 oz (135.5 kg)   Lab Results  Component Value Date   CREATININE 2.17 (H) 01/23/2023   CREATININE 1.22 (H) 12/13/2022   CREATININE 1.22 (H) 12/12/2022   Physical Exam Vitals and nursing note reviewed.  Constitutional:      Appearance: Normal appearance.  HENT:     Head: Normocephalic and atraumatic.  Cardiovascular:     Rate and Rhythm: Normal rate and regular rhythm.  Pulmonary:     Effort: Pulmonary effort is normal. No respiratory  distress.     Breath sounds: No wheezing or rales.  Abdominal:     General: There is no distension.     Palpations: Abdomen is soft.  Musculoskeletal:        General: No tenderness.     Cervical back: Normal range of motion and neck supple.     Right lower leg: Edema (1+ pitting) present.     Left lower leg: Edema (1+ pitting) present.  Skin:    General: Skin is warm and dry.  Neurological:     General: No focal deficit present.     Mental Status: She is alert and oriented to person, place, and time.  Psychiatric:        Mood and Affect: Mood normal.        Behavior: Behavior normal.        Thought Content: Thought content normal.  Assessment & Plan:  1: Chronic heart failure with preserved ejection fraction without LVH/LAE- - NYHA class III - euvolemic - weighing daily; reminded to call for an overnight weight gain of > 2 pounds or a weekly weight gain of > 5 pounds - weight down 18 pounds from last visit here 2 months ago - echo 10/31/22: EF of 60-65% with trivial MR.  - not adding salt to her food - furosemide 40mg  daily - spironolactone 50mg  daily; decrease to 25mg  d/t BP - has history of recurrent UTI's so would not be candidate for SGLT2 - BNP 12/01/22 was 39.6  2: HTN- - BP 84/56; decreasing spiro per  above - sees PCP Posey Pronto) at [place] 12/13/22 showed sodium 132, potassium 4.5, creatinine 1.22 & GFR 48 - BMP today  3: COPD- - uses albuterol inhaler that she uses PRN  4: Cirrhosis secondary to NASH- - ammonia 12/13/22 68; was 141 on 12/04/22 - on rifampin  5: OSA- - wears CPAP nightly   Return in 2 weeks, sooner if needed.  BMP results returned prior to note closure. Renal function worse. Had already decreased spiro earlier today at her OV. Will recheck BMP in 2 weeks at next visit.

## 2023-01-24 ENCOUNTER — Encounter: Payer: Self-pay | Admitting: Family

## 2023-02-07 ENCOUNTER — Encounter: Payer: Medicare HMO | Admitting: Family

## 2023-02-07 ENCOUNTER — Telehealth: Payer: Self-pay | Admitting: Family

## 2023-02-07 NOTE — Progress Notes (Deleted)
Patient ID: Carla Huerta, female    DOB: [birth year], 72 y.o.   MRN: [id number]  HPI  Ms Saxer is a 72 y/o female with a history of HTN, CKD, anemia, COPD, depression, diaphragm paralysis, gout, hyponatremia, cirrhosis d/t NASH, sleep apnea, previous tobacco use and chronic heart failure.   Echo 10/31/22: showed an EF of 60-65% with trivial MR.   Admitted 12/04/22 due to vomiting and confusion with profound elevated ammonia level due to hepatic encephalopathy. Given fluids, lactulose and rifaximin. Was in the ED 12/01/22 due to a fall. Admitted 11/22/22 due to acute pulmonary edema. IV diuresed  She presents today for a HF f/u visit with a chief complaint of   Past Medical History:  Diagnosis Date   Anemia    CHF (congestive heart failure) (HCC)    COPD (chronic obstructive pulmonary disease) (HCC)    Cough    Depression    Diaphragm paralysis    Gout    Hypertension    Hyponatremia    Insomnia    Liver disease    Nonalcoholic hepatosteatosis    PONV (postoperative nausea and vomiting)    Renal disorder    Sleep apnea    Splenic laceration    Vertigo    Past Surgical History:  Procedure Laterality Date   APPENDECTOMY     when she was 21 yearsa old   BREAST BIOPSY Left 06/17/2019   affirm bx distortion with calcs, coil clip, ADH   BREAST EXCISIONAL BIOPSY Left 09/23/2019   neg   BREAST LUMPECTOMY Left 09/23/2019   ADH   BREAST LUMPECTOMY WITH NEEDLE LOCALIZATION Left 09/23/2019   Procedure: LEFT BREAST LUMPECTOMY WITH NEEDLE LOCALIZATION;  Surgeon: Jovita Kussmaul, MD;  Location: [facility] OR[facility];  Service: General;  Laterality: Left;   COLONOSCOPY WITH PROPOFOL N/A 10/01/2016   Procedure: COLONOSCOPY WITH PROPOFOL;  Surgeon: Lucilla Lame, MD;  Location: [facility];  Service: Endoscopy;  Laterality: N/A;   ESOPHAGOGASTRODUODENOSCOPY (EGD) WITH PROPOFOL N/A 10/01/2016   Procedure: ESOPHAGOGASTRODUODENOSCOPY (EGD) WITH PROPOFOL;  Surgeon: Lucilla Lame, MD;  Location: [facility]  ENDOSCOPY;  Service: Endoscopy;  Laterality: N/A;   LIVER BIOPSY     2015   REPLACEMENT TOTAL KNEE Right 2002   TONSILLECTOMY AND ADENOIDECTOMY     when pt was 72 years old   Family History  Problem Relation Age of Onset   Breast cancer Sister    Breast cancer Paternal Grandmother    Breast cancer Other    Social History   Tobacco Use   Smoking status: Former    Years: 1    Types: Cigarettes   Smokeless tobacco: Current  Substance Use Topics   Alcohol use: No   Allergies  Allergen Reactions   Latex Rash    Local reaction where it touches her body     Review of Systems  Constitutional:  Positive for fatigue (easily). Negative for appetite change.  HENT:  Negative for congestion, postnasal drip and sore throat.   Eyes: Negative.   Respiratory:  Positive for shortness of breath (easily). Negative for cough and chest tightness.   Cardiovascular:  Positive for leg swelling (improving). Negative for chest pain and palpitations.  Gastrointestinal:  Negative for abdominal distention and abdominal pain.  Endocrine: Negative.   Genitourinary: Negative.   Musculoskeletal:  Negative for back pain and neck pain.  Skin: Negative.   Allergic/Immunologic: Negative.   Neurological:  Positive for dizziness (at times). Negative for light-headedness.  Hematological:  Negative for adenopathy. Does  not bruise/bleed easily.  Psychiatric/Behavioral:  Negative for dysphoric mood and sleep disturbance (sleeping on 2 pillows with CPAP). The patient is not nervous/anxious.      Physical Exam Vitals and nursing note reviewed.  Constitutional:      Appearance: Normal appearance.  HENT:     Head: Normocephalic and atraumatic.  Cardiovascular:     Rate and Rhythm: Normal rate and regular rhythm.  Pulmonary:     Effort: Pulmonary effort is normal. No respiratory distress.     Breath sounds: No wheezing or rales.  Abdominal:     General: There is no distension.     Palpations: Abdomen is  soft.  Musculoskeletal:        General: No tenderness.     Cervical back: Normal range of motion and neck supple.     Right lower leg: Edema (1+ pitting) present.     Left lower leg: Edema (1+ pitting) present.  Skin:    General: Skin is warm and dry.  Neurological:     General: No focal deficit present.     Mental Status: She is alert and oriented to person, place, and time.  Psychiatric:        Mood and Affect: Mood normal.        Behavior: Behavior normal.        Thought Content: Thought content normal.   Assessment & Plan:  1: Chronic heart failure with preserved ejection fraction without LVH/LAE- - NYHA class III - euvolemic - weighing daily; reminded to call for an overnight weight gain of > 2 pounds or a weekly weight gain of > 5 pounds - weight 288.2 pounds from last visit here 2 weeks ago - echo 10/31/22: EF of 60-65% with trivial MR.  - not adding salt to her food - continue furosemide 40mg  daily - continue spironolactone 25mg  daily - has history of recurrent UTI's so would not be candidate for SGLT2 - BNP 12/01/22 was 39.6  2: HTN- - BP  - sees PCP Posey Pronto) at [place]  - BMP 01/23/23 showed sodium 137, potassium 3.7, creatinine 2.17 & GFR 24 - BMP today  3: COPD- - uses albuterol inhaler that she uses PRN  4: Cirrhosis secondary to NASH- - ammonia 12/13/22 68; was 141 on 12/04/22 - on rifampin  5: OSA- - wears CPAP nightly

## 2023-02-07 NOTE — Telephone Encounter (Signed)
Patient did not show for her [facility] appointment on 02/07/23

## 2023-03-12 ENCOUNTER — Other Ambulatory Visit: Payer: Self-pay

## 2023-03-12 ENCOUNTER — Observation Stay
Admission: EM | Admit: 2023-03-12 | Discharge: 2023-03-14 | Disposition: A | Discharge status: Home Health | Payer: Medicare HMO | Attending: Internal Medicine | Admitting: Internal Medicine

## 2023-03-12 ENCOUNTER — Inpatient Hospital Stay: Payer: Medicare HMO

## 2023-03-12 ENCOUNTER — Emergency Department: Payer: Medicare HMO

## 2023-03-12 DIAGNOSIS — J449 Chronic obstructive pulmonary disease, unspecified: Secondary | ICD-10-CM | POA: Diagnosis present

## 2023-03-12 DIAGNOSIS — R400 Somnolence: Secondary | ICD-10-CM | POA: Diagnosis not present

## 2023-03-12 DIAGNOSIS — E871 Hypo-osmolality and hyponatremia: Secondary | ICD-10-CM | POA: Diagnosis not present

## 2023-03-12 DIAGNOSIS — I130 Hypertensive heart and chronic kidney disease with heart failure and stage 1 through stage 4 chronic kidney disease, or unspecified chronic kidney disease: Secondary | ICD-10-CM | POA: Diagnosis not present

## 2023-03-12 DIAGNOSIS — K7682 Hepatic encephalopathy: Principal | ICD-10-CM | POA: Diagnosis present

## 2023-03-12 DIAGNOSIS — I5032 Chronic diastolic (congestive) heart failure: Secondary | ICD-10-CM | POA: Diagnosis present

## 2023-03-12 DIAGNOSIS — Z96651 Presence of right artificial knee joint: Secondary | ICD-10-CM | POA: Diagnosis present

## 2023-03-12 DIAGNOSIS — Z6841 Body Mass Index (BMI) 40.0 and over, adult: Secondary | ICD-10-CM | POA: Diagnosis not present

## 2023-03-12 DIAGNOSIS — G4739 Other sleep apnea: Secondary | ICD-10-CM | POA: Diagnosis not present

## 2023-03-12 DIAGNOSIS — Z1152 Encounter for screening for COVID-19: Secondary | ICD-10-CM

## 2023-03-12 DIAGNOSIS — R4182 Altered mental status, unspecified: Secondary | ICD-10-CM | POA: Diagnosis present

## 2023-03-12 DIAGNOSIS — G9341 Metabolic encephalopathy: Secondary | ICD-10-CM | POA: Insufficient documentation

## 2023-03-12 DIAGNOSIS — G4733 Obstructive sleep apnea (adult) (pediatric): Secondary | ICD-10-CM | POA: Diagnosis present

## 2023-03-12 DIAGNOSIS — K7469 Other cirrhosis of liver: Secondary | ICD-10-CM | POA: Diagnosis present

## 2023-03-12 DIAGNOSIS — N1832 Chronic kidney disease, stage 3b: Secondary | ICD-10-CM | POA: Diagnosis not present

## 2023-03-12 DIAGNOSIS — Z7982 Long term (current) use of aspirin: Secondary | ICD-10-CM

## 2023-03-12 DIAGNOSIS — Z720 Tobacco use: Secondary | ICD-10-CM

## 2023-03-12 DIAGNOSIS — D696 Thrombocytopenia, unspecified: Secondary | ICD-10-CM | POA: Diagnosis present

## 2023-03-12 DIAGNOSIS — K7581 Nonalcoholic steatohepatitis (NASH): Secondary | ICD-10-CM | POA: Diagnosis present

## 2023-03-12 DIAGNOSIS — Z79899 Other long term (current) drug therapy: Secondary | ICD-10-CM

## 2023-03-12 DIAGNOSIS — Z803 Family history of malignant neoplasm of breast: Secondary | ICD-10-CM

## 2023-03-12 DIAGNOSIS — M109 Gout, unspecified: Secondary | ICD-10-CM | POA: Diagnosis present

## 2023-03-12 DIAGNOSIS — K7460 Unspecified cirrhosis of liver: Secondary | ICD-10-CM | POA: Diagnosis present

## 2023-03-12 DIAGNOSIS — E222 Syndrome of inappropriate secretion of antidiuretic hormone: Secondary | ICD-10-CM | POA: Diagnosis present

## 2023-03-12 DIAGNOSIS — Z91040 Latex allergy status: Secondary | ICD-10-CM

## 2023-03-12 DIAGNOSIS — Z7983 Long term (current) use of bisphosphonates: Secondary | ICD-10-CM

## 2023-03-12 DIAGNOSIS — K219 Gastro-esophageal reflux disease without esophagitis: Secondary | ICD-10-CM | POA: Diagnosis present

## 2023-03-12 LAB — URINE DRUG SCREEN, QUALITATIVE (ARMC ONLY)
Amphetamines, Ur Screen: NOT DETECTED
Barbiturates, Ur Screen: NOT DETECTED
Benzodiazepine, Ur Scrn: NOT DETECTED
Cannabinoid 50 Ng, Ur ~~LOC~~: NOT DETECTED
Cocaine Metabolite,Ur ~~LOC~~: NOT DETECTED
MDMA (Ecstasy)Ur Screen: NOT DETECTED
Methadone Scn, Ur: NOT DETECTED
Opiate, Ur Screen: NOT DETECTED
Phencyclidine (PCP) Ur S: NOT DETECTED
Tricyclic, Ur Screen: POSITIVE — AB

## 2023-03-12 LAB — COMPREHENSIVE METABOLIC PANEL
ALT: 28 U/L (ref 0–44)
AST: 49 U/L — ABNORMAL HIGH (ref 15–41)
Albumin: 3.1 g/dL — ABNORMAL LOW (ref 3.5–5.0)
Alkaline Phosphatase: 81 U/L (ref 38–126)
Anion gap: 10 (ref 5–15)
BUN: 33 mg/dL — ABNORMAL HIGH (ref 8–23)
CO2: 22 mmol/L (ref 22–32)
Calcium: 9.3 mg/dL (ref 8.9–10.3)
Chloride: 97 mmol/L — ABNORMAL LOW (ref 98–111)
Creatinine, Ser: 1.31 mg/dL — ABNORMAL HIGH (ref 0.44–1.00)
GFR, Estimated: 44 mL/min — ABNORMAL LOW (ref >60)
Glucose, Bld: 109 mg/dL — ABNORMAL HIGH (ref 70–99)
Potassium: 3.7 mmol/L (ref 3.5–5.1)
Sodium: 129 mmol/L — ABNORMAL LOW (ref 135–145)
Total Bilirubin: 1.2 mg/dL (ref 0.3–1.2)
Total Protein: 6.7 g/dL (ref 6.5–8.1)

## 2023-03-12 LAB — SARS CORONAVIRUS 2 BY RT PCR: SARS Coronavirus 2 by RT PCR: NEGATIVE

## 2023-03-12 LAB — CBC WITH DIFFERENTIAL/PLATELET
Abs Immature Granulocytes: 0.02 10*3/uL (ref 0.00–0.07)
Basophils Absolute: 0 10*3/uL (ref 0.0–0.1)
Basophils Relative: 0 %
Eosinophils Absolute: 0 10*3/uL (ref 0.0–0.5)
Eosinophils Relative: 0 %
HCT: 31.7 % — ABNORMAL LOW (ref 36.0–46.0)
Hemoglobin: 10.6 g/dL — ABNORMAL LOW (ref 12.0–15.0)
Immature Granulocytes: 0 %
Lymphocytes Relative: 17 %
Lymphs Abs: 1 10*3/uL (ref 0.7–4.0)
MCH: 31.6 pg (ref 26.0–34.0)
MCHC: 33.4 g/dL (ref 30.0–36.0)
MCV: 94.6 fL (ref 80.0–100.0)
Monocytes Absolute: 0.7 10*3/uL (ref 0.1–1.0)
Monocytes Relative: 13 %
Neutro Abs: 3.9 10*3/uL (ref 1.7–7.7)
Neutrophils Relative %: 70 %
Platelets: 139 10*3/uL — ABNORMAL LOW (ref 150–400)
RBC: 3.35 MIL/uL — ABNORMAL LOW (ref 3.87–5.11)
RDW: 15.8 % — ABNORMAL HIGH (ref 11.5–15.5)
WBC: 5.7 10*3/uL (ref 4.0–10.5)
nRBC: 0 % (ref 0.0–0.2)

## 2023-03-12 LAB — URINALYSIS, ROUTINE W REFLEX MICROSCOPIC
Bilirubin Urine: NEGATIVE
Glucose, UA: NEGATIVE mg/dL
Hgb urine dipstick: NEGATIVE
Ketones, ur: NEGATIVE mg/dL
Leukocytes,Ua: NEGATIVE
Nitrite: NEGATIVE
Protein, ur: NEGATIVE mg/dL
Specific Gravity, Urine: 1.006 (ref 1.005–1.030)
pH: 7 (ref 5.0–8.0)

## 2023-03-12 LAB — LACTIC ACID, PLASMA
Lactic Acid, Venous: 1.8 mmol/L (ref 0.5–1.9)
Lactic Acid, Venous: 1.3 mmol/L (ref 0.5–1.9)

## 2023-03-12 LAB — AMMONIA: Ammonia: 65 umol/L — ABNORMAL HIGH (ref 9–35)

## 2023-03-12 LAB — BRAIN NATRIURETIC PEPTIDE: B Natriuretic Peptide: 19 pg/mL (ref 0.0–100.0)

## 2023-03-12 LAB — TROPONIN I (HIGH SENSITIVITY)
Troponin I (High Sensitivity): 5 ng/L (ref <18)
Troponin I (High Sensitivity): 5 ng/L (ref <18)

## 2023-03-12 LAB — ETHANOL: Alcohol, Ethyl (B): <10 mg/dL (ref <10)

## 2023-03-12 LAB — CBG MONITORING, ED: Glucose-Capillary: 105 mg/dL — ABNORMAL HIGH (ref 70–99)

## 2023-03-12 MED ORDER — ONDANSETRON HCL 4 MG/2ML IJ SOLN
4.0000 mg | Freq: Once | INTRAMUSCULAR | Status: AC
  Administered 2023-03-12: 4 mg via INTRAVENOUS
  Filled 2023-03-12: qty 2

## 2023-03-12 MED ORDER — GADOBUTROL 1 MMOL/ML IV SOLN
10.0000 mL | Freq: Once | INTRAVENOUS | Status: AC | PRN
  Administered 2023-03-12: 10 mL via INTRAVENOUS

## 2023-03-12 MED ORDER — LACTULOSE 10 GM/15ML PO SOLN
30.0000 g | Freq: Once | ORAL | Status: AC
  Administered 2023-03-12: 30 g via ORAL
  Filled 2023-03-12: qty 60

## 2023-03-12 MED ORDER — FUROSEMIDE 10 MG/ML IJ SOLN
80.0000 mg | Freq: Once | INTRAMUSCULAR | Status: AC
  Administered 2023-03-12: 80 mg via INTRAVENOUS
  Filled 2023-03-12: qty 8

## 2023-03-12 NOTE — ED Notes (Signed)
Rounded on patient. Patient easily arouses to voice in bed with respirations even and non labored. Patient without signs of distress. See full neurological assessment for further details. NAD.

## 2023-03-12 NOTE — Assessment & Plan Note (Addendum)
Nonfocal on exam, going on for 2 days, associated with reported right upper extremity tremor intermittent with staring spells.  At this time I have ordered a rapid EEG after discussion with Dr. Otelia Limes of neurology., MRI brain of the patient rule out seizures as well as any occult CVA.  Working diagnosis of Hepatic Encephalopahty Rectal lactulose has been ordered  however patient's ammonia level is consistent with prior documented ammonia levels therefore will rule out alternative etiologies as mentioned above.  I noted the hyponatremia however I really do not think this degree of hyponatremia is causing current presentation for the patient.  Patient's mentation is poor enough that I think I will have to hold patient's diet tonight.  And implement supportive measures such as turning and positioning and DVT prophylaxis.

## 2023-03-12 NOTE — Assessment & Plan Note (Signed)
S/p Lasix in the ER, I will trend this with current target not to have it worsen, will continue with fluid restriction if worsens.  At this time patient is not candidate for oral diet

## 2023-03-12 NOTE — ED Triage Notes (Signed)
Pt comes in via [facility] from home with complaints of altered mental status. According to ems the patient husband passed away yesterday, and the patient hasn't been eating or drinking, and has been very altered. The patient has a history of UTI, and CHF. The patient is currently alert and oriented x1. The patient's vital signs were within normal limits for EMS. Pt has a 20G in the right AC. Pt received of normal saline in transit.

## 2023-03-12 NOTE — ED Provider Notes (Signed)
[facility] Provider Note    Event Date/Time   First MD Initiated Contact with Patient 03/12/23 1556     (approximate)   History   Altered Mental Status   HPI  Carla Huerta is a 72 y.o. female with a history of COPD, CHF, GERD, hypertension, alcoholic hepatosteatosis, liver cirrhosis, CKD and hepatic encephalopathy who presents with altered mental status, acute onset today.  Per EMS the patient's husband died yesterday.  Family reported that the patient had not been eating or drinking and was noted to be altered today.  The patient herself is confused and weak appearing and is unable to give any relevant history.  I reviewed the past medical records.  The patient was most recently admitted at the end of January.  Per the hospitalist discharge summary from 2/2 she was treated for hepatic encephalopathy.  Physical Exam   Triage Vital Signs: ED Triage Vitals  Enc Vitals Group     BP 03/12/23 1556 120/71     Pulse Rate 03/12/23 1556 96     Resp --      Temp 03/12/23 1556 97.9 F (36.6 C)     Temp src --      SpO2 03/12/23 1556 100 %     Weight 03/12/23 1558 288 lb 2.3 oz (130.7 kg)     Height 03/12/23 1558 5\' 5"  (1.651 m)     Head Circumference --      Peak Flow --      Pain Score 03/12/23 1557 0     Pain Loc --      Pain Edu? --      Excl. in GC? --     Most recent vital signs: Vitals:   03/12/23 1556 03/12/23 1900  BP: 120/71 (!) 106/47  Pulse: 96 97  Temp: 97.9 F (36.6 C)   SpO2: 100% 98%     General: Somnolent appearing but arousable, confused. CV:  Good peripheral perfusion.  Normal heart sounds. Resp:  Normal effort.  Lungs CTAB. Abd:  No distention.  Soft and nontender. Other:  EOMI.  PERRLA.  No facial droop.  Motor intact in all extremities.  Very dry mucous membranes.  Confused appearing, not able to answer any questions although following basic commands.  No significant peripheral edema.   ED Results / Procedures /  Treatments   Labs (all labs ordered are listed, but only abnormal results are displayed) Labs Reviewed  COMPREHENSIVE METABOLIC PANEL - Abnormal; Notable for the following components:      Result Value   Sodium 129 (*)    Chloride 97 (*)    Glucose, Bld 109 (*)    BUN 33 (*)    Creatinine, Ser 1.31 (*)    Albumin 3.1 (*)    AST 49 (*)    GFR, Estimated 44 (*)    All other components within normal limits  CBC WITH DIFFERENTIAL/PLATELET - Abnormal; Notable for the following components:   RBC 3.35 (*)    Hemoglobin 10.6 (*)    HCT 31.7 (*)    RDW 15.8 (*)    Platelets 139 (*)    All other components within normal limits  URINALYSIS, ROUTINE W REFLEX MICROSCOPIC - Abnormal; Notable for the following components:   Color, Urine STRAW (*)    APPearance CLEAR (*)    All other components within normal limits  URINE DRUG SCREEN, QUALITATIVE ([facility] ONLY) - Abnormal; Notable for the following components:   Tricyclic, Ur Screen  POSITIVE (*)    All other components within normal limits  AMMONIA - Abnormal; Notable for the following components:   Ammonia 65 (*)    All other components within normal limits  SARS CORONAVIRUS 2 BY RT PCR  CULTURE, BLOOD (ROUTINE X 2)  CULTURE, BLOOD (ROUTINE X 2)  LACTIC ACID, PLASMA  ETHANOL  BRAIN NATRIURETIC PEPTIDE  LACTIC ACID, PLASMA  BLOOD GAS, VENOUS  BASIC METABOLIC PANEL  TROPONIN I (HIGH SENSITIVITY)  TROPONIN I (HIGH SENSITIVITY)     EKG  ED ECG REPORT I, Dionne Bucy, the attending physician, personally viewed and interpreted this ECG.  Date: 03/12/2023 EKG Time: 1601 Rate: 92 Rhythm: normal sinus rhythm QRS Axis: normal Intervals: normal ST/T Wave abnormalities: normal Narrative Interpretation: no evidence of acute ischemia    RADIOLOGY  Chest x-ray: I independently viewed and interpreted the images; there are bilateral interstitial opacities consistent with edema  CT head: No ICH or other acute  abnormality  PROCEDURES:  Critical Care performed: No  Procedures   MEDICATIONS ORDERED IN ED: Medications  ondansetron (ZOFRAN) injection 4 mg (4 mg Intravenous Given 03/12/23 1654)  furosemide (LASIX) injection 80 mg (80 mg Intravenous Given 03/12/23 1759)  lactulose (CHRONULAC) 10 GM/15ML solution 30 g (30 g Oral Given 03/12/23 1759)     IMPRESSION / MDM / ASSESSMENT AND PLAN / ED COURSE  I reviewed the triage vital signs and the nursing notes.  72 year old female with PMH as noted above presents with acute onset of altered mental status today.  On exam her vital signs are normal.  She is actively coughing.  She appears confused although neurologic exam is nonfocal.  Differential diagnosis includes, but is not limited to, UTI, other acute infection/sepsis, electrolyte abnormality, AKI, other metabolic disturbance, hepatic encephalopathy, CHF exacerbation, medication side effect.  We will obtain CT head, chest x-ray, lab workup, and reassess.  Patient's presentation is most consistent with acute presentation with potential threat to life or bodily function.  The patient is on the cardiac monitor to evaluate for evidence of arrhythmia and/or significant heart rate changes.   ----------------------------------------- 7:01 PM on 03/12/2023 -----------------------------------------  CT head is negative for acute findings.  Chest x-ray shows evidence of CHF but no findings to suggest pneumonia, and the urinalysis is also negative.  COVID is negative.  Ammonia is somewhat elevated.  Electrolytes are unremarkable except for hyponatremia which I suspect is hypervolemic.  Overall presentation is most consistent with hepatic encephalopathy.  I obtained additional history from the patient's family member who is now here and states that the patient had a gradual worsening of her mental status similar to the prior episode of hepatic encephalopathy which then acutely worsened since yesterday.  The  patient has not been eating or drinking much.  I ordered lactulose as well as Lasix.  The patient will need admission for further management.  I consulted Dr. Maryjean Ka from the hospitalist service; based our discussion he agreed to evaluate the patient for admission.   FINAL CLINICAL IMPRESSION(S) / ED DIAGNOSES   Final diagnoses:  Altered mental status, unspecified altered mental status type  Hepatic encephalopathy (HCC)     Rx / DC Orders   ED Discharge Orders     None        Note:  This document was prepared using Dragon voice recognition software and may include unintentional dictation errors.    Dionne Bucy, MD 03/12/23 2001

## 2023-03-12 NOTE — ED Notes (Signed)
Critical care NP at bedside to assess patient. Patient remains in NAD at this time.

## 2023-03-12 NOTE — Progress Notes (Signed)
      NAME:  Carla Huerta, MRN:  [id number], DOB:  1951-02-26, LOS: 1 ADMISSION DATE:  03/12/2023, CONSULTATION DATE:  03/12/2023 REFERRING MD: Nolberto Hanlon,  REASON FOR CONSULT:  AMS    HPI  72 y.o. Female with PMH of Fatty Liver Cirrhosis with prior episodes of hepatic encephalopathy admitted to hospitalist service with altered mental status. Labs only notable for mild hyponatremia, elevated Creatine, low plts,, elevated ammonia levels which all appears to be improved from previous and at baseline. There was initial concerns for seizures and a STAT CTH and MRI  Brain was obtained which showed no acute findings. Ceribell (rapid EEG w/ limited leads) was ordered and given pt has to be monitored in the ICU, PCCM was consulted.  OBJECTIVE  Blood pressure 105/61, pulse 89, temperature 98.3 F (36.8 C), temperature source Oral, resp. rate 19, height 5\' 5"  (1.651 m), weight 130.7 kg, SpO2 99 %.       Intake/Output Summary (Last 24 hours) at 03/13/2023 0034 Last data filed at 03/13/2023 0005 Gross per 24 hour  Intake --  Output 750 ml  Net -750 ml   Filed Weights   03/12/23 1558  Weight: 130.7 kg   Physical Examination  GENERAL: 72 year-old female lying in the bed in no acute distress EYES: PEERLA. No scleral icterus. Extraocular muscles intact.  HEENT: Head atraumatic, normocephalic. Oropharynx and nasopharynx clear.  NECK:  No JVD, supple  LUNGS: Normal breath sounds bilaterally.  No use of accessory muscles of respiration.  CARDIOVASCULAR: S1, S2 normal. No murmurs, rubs, or gallops.  ABDOMEN: Soft, NTND EXTREMITIES: No swelling or erythema.  Capillary refill is less than 3 seconds in all extremities. Pulses palpable distally. NEUROLOGIC: The patient is alert and oriented to person, place, able to state correct year but not month. Follows commands appropriately, able to move all extremities. No focal neurological deficit noted. . Cranial nerves are intact.  SKIN: No obvious rash, lesion,  or ulcer. Warm to touch  Assessment & Plan:    # Acute Toxic Metabolic encephalopathy Likely multifactorial in the setting of recent grief, pt just lost husband yesterday, Hepatic Encephalopathy and possible seizures -CTH negative -Follow up MRI Brain negative -UA and UDS negative -Ammonia levels elevated but also appears to be at baseline on Lactulose -Ceribell (rapid EEG w/ limited leads) ordered by admitting/Neurology Dr. Otelia Limes, given no active seizures and limited resources and beds in the ICU, I discussed the feasibility of the test with on call Neurologist to review need and advise. See sepate notes. -Routine EEG pending. -No critical care needs at this time. Patient awake alert and oriented x 3. No focal neuroligical deficit. Patient is hemodynamically stable and protecting her airway. PCCM will sign off, please re-consult with any critical needs.    Critical care time: 45 minutes        Webb Silversmith DNP, CCRN, FNP-C, AGACNP-BC Acute Care & Family Nurse Practitioner [facility] PCCM on call pager [phone]

## 2023-03-12 NOTE — Plan of Care (Addendum)
  These are curbside recommendations based upon the information readily available in the chart on brief review as well as history and examination information provided to me by requesting provider and do not replace a full detailed consult   I was asked to comment on utility of Ceribell (rapid EEG w/ limited leads) for this patient.  She has had some episodes of right upper extremity tremor with staring, and she has come in with encephalopathy, somnolence with concern for possible acute grief reaction due to family member passing away yesterday.  She has also had prior hospitalizations for hepatic encephalopathy.  Exam as documented by admitting team is notable for patient giving some one-word answers but are unreliable after persistent questioning but otherwise moving all 4 extremities, interacting with examiner, moving more spontaneously than on command.  Per discussion with ICU provider Ouma, patient's neuro exam has otherwise remained stable, and nursing notes document patient arouses easily to voice in bed  Typically rapid EEG is not useful for ruling out focal seizures due to limited leads, but is useful for ruling out nonconvulsive generalized seizure that would result in a comatose patient. Nonconvulsive status with a confused patient is still possible but less of a neurological emergency.  Given rapid EEG requires ICU admission at [facility] and there are no other ICU needs nor ICU beds available for this patient I do think it is reasonable to wait for full routine EEG tomorrow given working diagnosis of hepatic encephalopathy per primary team

## 2023-03-12 NOTE — H&P (Signed)
History and Physical    Patient: Carla Huerta:[id number] DOB: 25-Nov-1950 DOA: 03/12/2023 DOS: the patient was seen and examined on 03/12/2023 PCP: Hillery Aldo, MD  Patient coming from: Home  Chief Complaint:  Chief Complaint  Patient presents with   Altered Mental Status   HPI: Carla Huerta is a 72 y.o. female with medical history significant of "fatty liver "cirrhosis.  With prior episodes of hepatic encephalopathy.  History is obtained primarily from the patient's daughter who is at the bedside.  Patient seems to have been in her usual state of health till about 2 days ago.  Patient is generally alert awake and interactive.  Patient has very poor ambulatory status requiring a walker with seat.  2 days ago patient seems to have had new onset of right upper extremity tremor that was episodic and was associated with staring spells.  These would resolve on their own with normal mentation in between the spells.  Subsequently patient was noted to have some slurred speech yesterday and patient has had progressive somnolence, not eating and drinking and not interacting since yesterday.  Today patient has basically been in recliner or bed all day and is not interacting meaningfully with family.  Is not waking up.  There is no report of patient having any fever.  Patient did have 2 episodes of vomiting today.  No diarrhea is reported no fever.  No chest pain or shortness of breath.  Patient apparently had a fall at home 2 days ago, patient was complaining of some back pain yesterday.  Patient has continued to have altered mental status in the ER and is not waking up  Patient has been was recently hospitalized and passed away yesterday.  Review of Systems: unable to review all systems due to the inability of the patient to answer questions. Past Medical History:  Diagnosis Date   Anemia    CHF (congestive heart failure) (HCC)    COPD (chronic obstructive pulmonary disease) (HCC)    Cough     Depression    Diaphragm paralysis    Gout    Hypertension    Hyponatremia    Insomnia    Liver disease    Nonalcoholic hepatosteatosis    PONV (postoperative nausea and vomiting)    Renal disorder    Sleep apnea    Splenic laceration    Vertigo    Past Surgical History:  Procedure Laterality Date   APPENDECTOMY     when she was 15 yearsa old   BREAST BIOPSY Left 06/17/2019   affirm bx distortion with calcs, coil clip, ADH   BREAST EXCISIONAL BIOPSY Left 09/23/2019   neg   BREAST LUMPECTOMY Left 09/23/2019   ADH   BREAST LUMPECTOMY WITH NEEDLE LOCALIZATION Left 09/23/2019   Procedure: LEFT BREAST LUMPECTOMY WITH NEEDLE LOCALIZATION;  Surgeon: Griselda Miner, MD;  Location: [facility] ORS;  Service: General;  Laterality: Left;   COLONOSCOPY WITH PROPOFOL N/A 10/01/2016   Procedure: COLONOSCOPY WITH PROPOFOL;  Surgeon: Midge Minium, MD;  Location: [facility] ENDOSCOPY;  Service: Endoscopy;  Laterality: N/A;   ESOPHAGOGASTRODUODENOSCOPY (EGD) WITH PROPOFOL N/A 10/01/2016   Procedure: ESOPHAGOGASTRODUODENOSCOPY (EGD) WITH PROPOFOL;  Surgeon: Midge Minium, MD;  Location: [facility] ENDOSCOPY;  Service: Endoscopy;  Laterality: N/A;   LIVER BIOPSY     2015   REPLACEMENT TOTAL KNEE Right 2002   TONSILLECTOMY AND ADENOIDECTOMY     when pt was 72 years old   Social History:  reports that she has quit smoking. Her  smoking use included cigarettes. She uses smokeless tobacco. She reports that she does not drink alcohol and does not use drugs.  Allergies  Allergen Reactions   Latex Rash    Local reaction where it touches her body    Family History  Problem Relation Age of Onset   Breast cancer Sister    Breast cancer Paternal Grandmother    Breast cancer Other     Prior to Admission medications   Medication Sig Start Date End Date Taking? Authorizing Provider  albuterol (PROVENTIL HFA;VENTOLIN HFA) 108 (90 Base) MCG/ACT inhaler Inhale 2 puffs into the lungs every 4 (four) hours as needed for  wheezing or shortness of breath.    [provider]  alendronate (FOSAMAX) 70 MG tablet Take 70 mg by mouth every Wednesday.     [provider]  allopurinol (ZYLOPRIM) 100 MG tablet Take 100 mg by mouth daily.    [provider]  aspirin 81 MG chewable tablet Chew 81 mg by mouth at bedtime.    [provider]  atorvastatin (LIPITOR) 40 MG tablet Take 40 mg by mouth daily.     [provider]  bisacodyl (DULCOLAX) 10 MG suppository Place 1 suppository (10 mg total) rectally as needed for severe constipation. 12/13/22   Gillis Santa, MD  bisacodyl (DULCOLAX) 5 MG EC tablet Take 2 tablets (10 mg total) by mouth at bedtime. Skip the dose if no constipation 12/13/22   Gillis Santa, MD  ferrous sulfate 325 (65 FE) MG EC tablet Take 325 mg by mouth daily with breakfast.    [provider]  furosemide (LASIX) 40 MG tablet Take 1 tablet (40 mg total) by mouth daily. 11/30/22 05/29/23  Almon Hercules, MD  gabapentin (NEURONTIN) 300 MG capsule Take 2 capsules (600 mg total) by mouth at bedtime. 11/26/22 12/26/22  Almon Hercules, MD  imipramine (TOFRANIL) 50 MG tablet Take 100 mg by mouth at bedtime.     [provider]  lactulose (CHRONULAC) 10 GM/15ML solution Take 20 g (30 mL) 1-3 times a day for a goal of 1-2 bowel movements a day. 11/26/22   Almon Hercules, MD  omeprazole (PRILOSEC) 40 MG capsule Take 40 mg by mouth daily.     [provider]  rifaximin (XIFAXAN) 550 MG TABS tablet Take 1 tablet (550 mg total) by mouth 2 (two) times daily. 12/13/22 03/13/23  Gillis Santa, MD  spironolactone (ALDACTONE) 50 MG tablet Take 1 tablet (50 mg total) by mouth daily. Patient taking differently: Take 25 mg by mouth daily. 11/30/22   Almon Hercules, MD  tolterodine (DETROL LA) 4 MG 24 hr capsule Take 4 mg by mouth at bedtime.    [provider]  Home medications cannot be reliably obtained as patient is altered mental status.  Physical  Exam: Vitals:   03/12/23 1556 03/12/23 1558  BP: 120/71   Pulse: 96   Temp: 97.9 F (36.6 C)   SpO2: 100%   Weight:  130.7 kg  Height:  5\' 5"  (1.651 m)   General: Patient keeps eyes closed and is slightly restless turning from side-to-side occasionally.  Patient moves all 4 extremities spontaneously over a long period of time.  However I did not appreciate any tremor or abnormal tone.  Patient is not following directions.  Patient is not interactingmaybe gives one-word answers after persistent questioning.  Which is not reliable. Respiratory exam: Patient is very obese patient is having poor excursion breath sounds are poorly heard  bilateral intravesicular Cardiovascular exam S1-S2 normal Abdomen soft nontender Extremities are warm without edema.  Initially patient did not move left lower extremity for me for long time, however was noted to subsequently moved spontaneously. Patient on room air. Data Reviewed:  Labs on Admission:  Results for orders placed or performed during the hospital encounter of 03/12/23 (from the past 24 hour(s))  Lactic acid, plasma     Status: None   Collection Time: 03/12/23  3:58 PM  Result Value Ref Range   Lactic Acid, Venous 1.8 0.5 - 1.9 mmol/L  Comprehensive metabolic panel     Status: Abnormal   Collection Time: 03/12/23  4:05 PM  Result Value Ref Range   Sodium 129 (L) 135 - 145 mmol/L   Potassium 3.7 3.5 - 5.1 mmol/L   Chloride 97 (L) 98 - 111 mmol/L   CO2 22 22 - 32 mmol/L   Glucose, Bld 109 (H) 70 - 99 mg/dL   BUN 33 (H) 8 - 23 mg/dL   Creatinine, Ser 4.09 (H) 0.44 - 1.00 mg/dL   Calcium 9.3 8.9 - 81.1 mg/dL   Total Protein 6.7 6.5 - 8.1 g/dL   Albumin 3.1 (L) 3.5 - 5.0 g/dL   AST 49 (H) 15 - 41 U/L   ALT 28 0 - 44 U/L   Alkaline Phosphatase 81 38 - 126 U/L   Total Bilirubin 1.2 0.3 - 1.2 mg/dL   GFR, Estimated 44 (L) >60 mL/min   Anion gap 10 5 - 15  CBC with Differential     Status: Abnormal   Collection Time: 03/12/23  4:05 PM   Result Value Ref Range   WBC 5.7 4.0 - 10.5 K/uL   RBC 3.35 (L) 3.87 - 5.11 MIL/uL   Hemoglobin 10.6 (L) 12.0 - 15.0 g/dL   HCT 91.4 (L) 78.2 - 95.6 %   MCV 94.6 80.0 - 100.0 fL   MCH 31.6 26.0 - 34.0 pg   MCHC 33.4 30.0 - 36.0 g/dL   RDW 21.3 (H) 08.6 - 57.8 %   Platelets 139 (L) 150 - 400 K/uL   nRBC 0.0 0.0 - 0.2 %   Neutrophils Relative % 70 %   Neutro Abs 3.9 1.7 - 7.7 K/uL   Lymphocytes Relative 17 %   Lymphs Abs 1.0 0.7 - 4.0 K/uL   Monocytes Relative 13 %   Monocytes Absolute 0.7 0.1 - 1.0 K/uL   Eosinophils Relative 0 %   Eosinophils Absolute 0.0 0.0 - 0.5 K/uL   Basophils Relative 0 %   Basophils Absolute 0.0 0.0 - 0.1 K/uL   Immature Granulocytes 0 %   Abs Immature Granulocytes 0.02 0.00 - 0.07 K/uL  Troponin I (High Sensitivity)     Status: None   Collection Time: 03/12/23  4:05 PM  Result Value Ref Range   Troponin I (High Sensitivity) 5 <18 ng/L  Ethanol     Status: None   Collection Time: 03/12/23  4:05 PM  Result Value Ref Range   Alcohol, Ethyl (B) <10 <10 mg/dL  Brain natriuretic peptide     Status: None   Collection Time: 03/12/23  4:05 PM  Result Value Ref Range   B Natriuretic Peptide 19.0 0.0 - 100.0 pg/mL  Ammonia     Status: Abnormal   Collection Time: 03/12/23  4:24 PM  Result Value Ref Range   Ammonia 65 (H) 9 - 35 umol/L  SARS Coronavirus 2 by RT PCR (hospital order, performed in [facility] hospital lab) *  cepheid single result test* Anterior Nasal Swab     Status: None   Collection Time: 03/12/23  4:47 PM   Specimen: Anterior Nasal Swab  Result Value Ref Range   SARS Coronavirus 2 by RT PCR NEGATIVE NEGATIVE  Urinalysis, Routine w reflex microscopic -Urine, Catheterized     Status: Abnormal   Collection Time: 03/12/23  4:47 PM  Result Value Ref Range   Color, Urine STRAW (A) YELLOW   APPearance CLEAR (A) CLEAR   Specific Gravity, Urine 1.006 1.005 - 1.030   pH 7.0 5.0 - 8.0   Glucose, UA NEGATIVE NEGATIVE mg/dL   Hgb urine dipstick  NEGATIVE NEGATIVE   Bilirubin Urine NEGATIVE NEGATIVE   Ketones, ur NEGATIVE NEGATIVE mg/dL   Protein, ur NEGATIVE NEGATIVE mg/dL   Nitrite NEGATIVE NEGATIVE   Leukocytes,Ua NEGATIVE NEGATIVE  Urine Drug Screen, Qualitative     Status: Abnormal   Collection Time: 03/12/23  4:47 PM  Result Value Ref Range   Tricyclic, Ur Screen POSITIVE (A) NONE DETECTED   Amphetamines, Ur Screen NONE DETECTED NONE DETECTED   MDMA (Ecstasy)Ur Screen NONE DETECTED NONE DETECTED   Cocaine Metabolite,Ur [place] NONE DETECTED NONE DETECTED   Opiate, Ur Screen NONE DETECTED NONE DETECTED   Phencyclidine (PCP) Ur S NONE DETECTED NONE DETECTED   Cannabinoid 50 Ng, Ur  NONE DETECTED NONE DETECTED   Barbiturates, Ur Screen NONE DETECTED NONE DETECTED   Benzodiazepine, Ur Scrn NONE DETECTED NONE DETECTED   Methadone Scn, Ur NONE DETECTED NONE DETECTED   Radiological Exams on Admission:  CT Head Wo Contrast  Result Date: 03/12/2023 CLINICAL DATA:  Altered mental status EXAM: CT HEAD WITHOUT CONTRAST TECHNIQUE: Contiguous axial images were obtained from the base of the skull through the vertex without intravenous contrast. RADIATION DOSE REDUCTION: This exam was performed according to the departmental dose-optimization program which includes automated exposure control, adjustment of the mA and/or kV according to patient size and/or use of iterative reconstruction technique. COMPARISON:  12/04/2022 FINDINGS: Brain: No acute intracranial findings are seen. There are no signs of bleeding within the cranium. There is no focal edema or mass effect. Cortical sulci are prominent. There is decreased density in periventricular white matter. Small old lacunar infarct is noted in right basal ganglia with no significant change. Vascular: Unremarkable. Skull: No acute findings are seen. Sinuses/Orbits: Unremarkable. Other: No significant interval changes are noted. IMPRESSION: No acute intracranial findings are seen in noncontrast CT  brain. Atrophy. Electronically Signed   By: Ernie Avena M.D.   On: 03/12/2023 17:55   DG Chest Port 1 View  Result Date: 03/12/2023 CLINICAL DATA:  Shortness of breath. EXAM: PORTABLE CHEST 1 VIEW COMPARISON:  December 01, 2022. FINDINGS: Stable cardiomegaly. Mild central pulmonary vascular congestion is noted. Probable mild bilateral pulmonary edema is noted. Bony thorax is unremarkable. IMPRESSION: Stable cardiomegaly with mild central pulmonary vascular congestion and probable bilateral pulmonary edema. Electronically Signed   By: Lupita Raider M.D.   On: 03/12/2023 16:41    EKG: Independently reviewed. NSR   Assessment and Plan: * AMS (altered mental status) Nonfocal on exam, going on for 2 days, associated with reported right upper extremity tremor intermittent with staring spells.  At this time I have ordered a rapid EEG after discussion with Dr. Otelia Limes of neurology., MRI brain of the patient rule out seizures as well as any occult CVA.  Working diagnosis of Hepatic Encephalopahty Rectal lactulose has been ordered  however patient's ammonia level is consistent with prior  documented ammonia levels therefore will rule out alternative etiologies as mentioned above.  I noted the hyponatremia however I really do not think this degree of hyponatremia is causing current presentation for the patient.  Patient's mentation is poor enough that I think I will have to hold patient's diet tonight.  And implement supportive measures such as turning and positioning and DVT prophylaxis.    Hyponatremia S/p Lasix in the ER, I will trend this with current target not to have it worsen, will continue with fluid restriction if worsens.  At this time patient is not candidate for oral diet   Patient home medication reconciliation is pending.   Advance Care Planning:   Code Status: Full Code   Consults: discussion with Dr. Otelia Limes. Please reconsult in AM if formal evaluation is sought.  Family  Communication: daughter at bedside.  Severity of Illness: The appropriate patient status for this patient is INPATIENT. Inpatient status is judged to be reasonable and necessary in order to provide the required intensity of service to ensure the patient's safety. The patient's presenting symptoms, physical exam findings, and initial radiographic and laboratory data in the context of their chronic comorbidities is felt to place them at high risk for further clinical deterioration. Furthermore, it is not anticipated that the patient will be medically stable for discharge from the hospital within 2 midnights of admission.   * I certify that at the point of admission it is my clinical judgment that the patient will require inpatient hospital care spanning beyond 2 midnights from the point of admission due to high intensity of service, high risk for further deterioration and high frequency of surveillance required.*  Author: Nolberto Hanlon, MD 03/12/2023 7:47 PM  For on call review [email or web address].

## 2023-03-13 ENCOUNTER — Other Ambulatory Visit: Payer: Self-pay

## 2023-03-13 DIAGNOSIS — N1832 Chronic kidney disease, stage 3b: Secondary | ICD-10-CM | POA: Diagnosis not present

## 2023-03-13 DIAGNOSIS — K7581 Nonalcoholic steatohepatitis (NASH): Secondary | ICD-10-CM

## 2023-03-13 DIAGNOSIS — K7460 Unspecified cirrhosis of liver: Secondary | ICD-10-CM

## 2023-03-13 DIAGNOSIS — K7682 Hepatic encephalopathy: Secondary | ICD-10-CM

## 2023-03-13 LAB — BLOOD GAS, VENOUS
Acid-Base Excess: 1 mmol/L (ref 0.0–2.0)
Bicarbonate: 25.2 mmol/L (ref 20.0–28.0)
O2 Saturation: 95.7 %
Patient temperature: 37
pCO2, Ven: 38 mmHg — ABNORMAL LOW (ref 44–60)
pH, Ven: 7.43 (ref 7.25–7.43)
pO2, Ven: 69 mmHg — ABNORMAL HIGH (ref 32–45)

## 2023-03-13 LAB — PROTIME-INR
INR: 1.3 — ABNORMAL HIGH (ref 0.8–1.2)
Prothrombin Time: 16 seconds — ABNORMAL HIGH (ref 11.4–15.2)

## 2023-03-13 LAB — CULTURE, BLOOD (ROUTINE X 2): Culture: NO GROWTH

## 2023-03-13 LAB — BASIC METABOLIC PANEL
Anion gap: 10 (ref 5–15)
Anion gap: 11 (ref 5–15)
BUN: 34 mg/dL — ABNORMAL HIGH (ref 8–23)
BUN: 35 mg/dL — ABNORMAL HIGH (ref 8–23)
CO2: 26 mmol/L (ref 22–32)
CO2: 25 mmol/L (ref 22–32)
Calcium: 9.4 mg/dL (ref 8.9–10.3)
Calcium: 9.2 mg/dL (ref 8.9–10.3)
Chloride: 96 mmol/L — ABNORMAL LOW (ref 98–111)
Chloride: 98 mmol/L (ref 98–111)
Creatinine, Ser: 1.42 mg/dL — ABNORMAL HIGH (ref 0.44–1.00)
Creatinine, Ser: 1.49 mg/dL — ABNORMAL HIGH (ref 0.44–1.00)
GFR, Estimated: 40 mL/min — ABNORMAL LOW (ref >60)
GFR, Estimated: 38 mL/min — ABNORMAL LOW (ref >60)
Glucose, Bld: 107 mg/dL — ABNORMAL HIGH (ref 70–99)
Glucose, Bld: 103 mg/dL — ABNORMAL HIGH (ref 70–99)
Potassium: 3.9 mmol/L (ref 3.5–5.1)
Potassium: 3.9 mmol/L (ref 3.5–5.1)
Sodium: 132 mmol/L — ABNORMAL LOW (ref 135–145)
Sodium: 134 mmol/L — ABNORMAL LOW (ref 135–145)

## 2023-03-13 LAB — CBC
HCT: 31.3 % — ABNORMAL LOW (ref 36.0–46.0)
HCT: 32.1 % — ABNORMAL LOW (ref 36.0–46.0)
Hemoglobin: 10.3 g/dL — ABNORMAL LOW (ref 12.0–15.0)
Hemoglobin: 10.5 g/dL — ABNORMAL LOW (ref 12.0–15.0)
MCH: 31.5 pg (ref 26.0–34.0)
MCH: 31.8 pg (ref 26.0–34.0)
MCHC: 32.7 g/dL (ref 30.0–36.0)
MCHC: 32.9 g/dL (ref 30.0–36.0)
MCV: 96.4 fL (ref 80.0–100.0)
MCV: 96.6 fL (ref 80.0–100.0)
Platelets: 144 10*3/uL — ABNORMAL LOW (ref 150–400)
Platelets: 151 10*3/uL (ref 150–400)
RBC: 3.24 MIL/uL — ABNORMAL LOW (ref 3.87–5.11)
RBC: 3.33 MIL/uL — ABNORMAL LOW (ref 3.87–5.11)
RDW: 15.9 % — ABNORMAL HIGH (ref 11.5–15.5)
RDW: 15.9 % — ABNORMAL HIGH (ref 11.5–15.5)
WBC: 5.5 10*3/uL (ref 4.0–10.5)
WBC: 5.6 10*3/uL (ref 4.0–10.5)
nRBC: 0 % (ref 0.0–0.2)
nRBC: 0 % (ref 0.0–0.2)

## 2023-03-13 LAB — GLUCOSE, CAPILLARY
Glucose-Capillary: 80 mg/dL (ref 70–99)
Glucose-Capillary: 102 mg/dL — ABNORMAL HIGH (ref 70–99)

## 2023-03-13 LAB — HEMOGLOBIN A1C
Hgb A1c MFr Bld: 5.5 % (ref 4.8–5.6)
Mean Plasma Glucose: 111.15 mg/dL

## 2023-03-13 LAB — CBG MONITORING, ED
Glucose-Capillary: 100 mg/dL — ABNORMAL HIGH (ref 70–99)
Glucose-Capillary: 85 mg/dL (ref 70–99)
Glucose-Capillary: 92 mg/dL (ref 70–99)

## 2023-03-13 LAB — APTT: aPTT: 35 seconds (ref 24–36)

## 2023-03-13 MED ORDER — SODIUM CHLORIDE 0.9% FLUSH
3.0000 mL | Freq: Two times a day (BID) | INTRAVENOUS | Status: DC
  Administered 2023-03-13 – 2023-03-14 (×4): 3 mL via INTRAVENOUS

## 2023-03-13 MED ORDER — INSULIN ASPART 100 UNIT/ML IJ SOLN: 0.0000 [IU] | Freq: Every day | INTRAMUSCULAR | Status: DC

## 2023-03-13 MED ORDER — SODIUM CHLORIDE 0.9% FLUSH: 3.0000 mL | INTRAVENOUS | Status: DC | PRN

## 2023-03-13 MED ORDER — ENOXAPARIN SODIUM 80 MG/0.8ML IJ SOSY
0.5000 mg/kg | PREFILLED_SYRINGE | INTRAMUSCULAR | Status: DC
  Administered 2023-03-13 – 2023-03-14 (×2): 65 mg via SUBCUTANEOUS
  Filled 2023-03-13 (×2): qty 0.65

## 2023-03-13 MED ORDER — INSULIN ASPART 100 UNIT/ML IJ SOLN: 0.0000 [IU] | Freq: Three times a day (TID) | INTRAMUSCULAR | Status: DC

## 2023-03-13 MED ORDER — ALBUTEROL SULFATE HFA 108 (90 BASE) MCG/ACT IN AERS: 2.0000 | INHALATION_SPRAY | RESPIRATORY_TRACT | Status: DC | PRN

## 2023-03-13 MED ORDER — RIFAXIMIN 550 MG PO TABS
550.0000 mg | ORAL_TABLET | Freq: Two times a day (BID) | ORAL | Status: DC
  Administered 2023-03-13 – 2023-03-14 (×3): 550 mg via ORAL
  Filled 2023-03-13 (×3): qty 1

## 2023-03-13 MED ORDER — PANTOPRAZOLE SODIUM 40 MG IV SOLR
40.0000 mg | INTRAVENOUS | Status: DC
  Administered 2023-03-13 – 2023-03-14 (×2): 40 mg via INTRAVENOUS
  Filled 2023-03-13 (×2): qty 10

## 2023-03-13 MED ORDER — ATORVASTATIN CALCIUM 20 MG PO TABS
40.0000 mg | ORAL_TABLET | Freq: Every day | ORAL | Status: DC
  Administered 2023-03-13 – 2023-03-14 (×2): 40 mg via ORAL
  Filled 2023-03-13 (×2): qty 2

## 2023-03-13 MED ORDER — ALBUTEROL SULFATE (2.5 MG/3ML) 0.083% IN NEBU: 2.5000 mg | INHALATION_SOLUTION | RESPIRATORY_TRACT | Status: DC | PRN

## 2023-03-13 MED ORDER — LACTULOSE ENEMA
300.0000 mL | Freq: Two times a day (BID) | ORAL | Status: DC
  Administered 2023-03-13: 300 mL via RECTAL
  Filled 2023-03-13 (×2): qty 300

## 2023-03-13 MED ORDER — SODIUM CHLORIDE 0.9 % IV SOLN: 250.0000 mL | INTRAVENOUS | Status: DC | PRN

## 2023-03-13 MED ORDER — LACTULOSE 10 GM/15ML PO SOLN
30.0000 g | Freq: Three times a day (TID) | ORAL | Status: DC
  Administered 2023-03-13 – 2023-03-14 (×3): 30 g via ORAL
  Filled 2023-03-13 (×3): qty 60

## 2023-03-13 NOTE — ED Notes (Signed)
Pericare performed, patient brief changed. Re-positioned in bed. Denies needs.

## 2023-03-13 NOTE — ED Notes (Signed)
Patient's daughter given update with patient's permission.

## 2023-03-13 NOTE — ED Notes (Signed)
Rounded on patient. Patient awake and alert in bed, arouses to voice. Patient able to participate in neuro exam. Patient in NAD, denies needs at this time.

## 2023-03-13 NOTE — ED Notes (Signed)
Rounded on patient. Patient supine on stretcher with respirations even and non labored, patient without signs of distress.

## 2023-03-13 NOTE — ED Notes (Signed)
Enema complete at this time. Linens and brief changed, pericare provided.

## 2023-03-13 NOTE — Progress Notes (Signed)
   03/13/23 1500  Spiritual Encounters  Type of Visit Initial  Care provided to: Pt and family  Conversation partners present during encounter Nurse  Referral source Family  Reason for visit Advance directives  OnCall Visit Yes  Spiritual Framework  Patient Stress Factors Major life changes  Family Stress Factors Major life changes  Interventions  Spiritual Care Interventions Made Established relationship of care and support;Compassionate presence;Reflective listening   Family wanted information on advance directive provided info to family and patient.

## 2023-03-13 NOTE — ED Notes (Signed)
Rounded on patient. Linens changed, patient moved to hospital bed for comfort. Denies needs.

## 2023-03-13 NOTE — Discharge Instructions (Signed)

## 2023-03-13 NOTE — ED Notes (Signed)
RN reached out to provider for VS order revision. ICU vital sign checks no longer applicable as pt is med surg.

## 2023-03-13 NOTE — Progress Notes (Signed)
PHARMACIST - PHYSICIAN COMMUNICATION  CONCERNING:  Enoxaparin (Lovenox) for DVT Prophylaxis    RECOMMENDATION: Patient was prescribed enoxaprin 40mg  q24 hours for VTE prophylaxis.   Filed Weights   03/12/23 1558  Weight: 130.7 kg (288 lb 2.3 oz)    Body mass index is 47.95 kg/m.  Estimated Creatinine Clearance: 54.6 mL/min (A) (by C-G formula based on SCr of 1.31 mg/dL (H)).   Based on [facility] policy patient is candidate for enoxaparin 0.5mg /kg TBW SQ every 24 hours based on BMI being >30.  DESCRIPTION: Pharmacy has adjusted enoxaparin dose per [facility] policy.  Patient is now receiving enoxaparin 0.5 mg/kg every 24 hours   Otelia Sergeant, PharmD, [facility] 03/13/2023 12:41 AM

## 2023-03-13 NOTE — Hospital Course (Signed)
Carla Huerta is a 73 y.o. female with medical history significant of liver cirrhosis, prior episodes of hepatic encephalopathy, chronic diastolic congestive heart failure, COPD, essential hypertension, obstructive sleep apnea and morbid obesity who present to the hospital with altered mental status. She is diagnosed with hepatic cephalopathy, was started on rectal lactulose.

## 2023-03-13 NOTE — ED Notes (Signed)
Rounded on patient. Brief and linens changed, peri care preformed. Patient continues to arouse to voice, oriented x2 as previous. Denies current needs.

## 2023-03-13 NOTE — Progress Notes (Signed)
  Progress Note   Patient: Carla Huerta ZOX:[id number] DOB: May 31, 1951 DOA: 03/12/2023     1 DOS: the patient was seen and examined on 03/13/2023   Brief hospital course: AALIYANA FREDERICKS is a 71 y.o. female with medical history significant of liver cirrhosis, prior episodes of hepatic encephalopathy, chronic diastolic congestive heart failure, COPD, essential hypertension, obstructive sleep apnea and morbid obesity who present to the hospital with altered mental status. She is diagnosed with hepatic cephalopathy, was started on rectal lactulose.   Principal Problem:   AMS (altered mental status) Active Problems:   Hepatic encephalopathy (HCC)   Liver cirrhosis secondary to NASH (nonalcoholic steatohepatitis) (HCC)   Morbid obesity with BMI of 45.0-49.9, adult (HCC)   Obstructive sleep apnea   Thrombocytopenia, acquired (HCC)   Chronic kidney disease, stage 3b (HCC)   Chronic diastolic CHF (congestive heart failure) (HCC)   COPD (chronic obstructive pulmonary disease) (HCC)   Hyponatremia   Assessment and Plan: Hepatic encephalopathy Liver cirrhosis secondary to nonalcoholic steatohepatitis. Patient has significant altered mental status, consistent with hepatic cephalopathy. Patient is treated with lactulose per rectum, rifaximin.  Her mental status much improved today, will change lactulose to oral.  Hyponatremia. 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 Fluid restriction.  Morbid obesity.  BMI 47.95. Obstructive sleep apnea. Diet and exercise.  CPAP while asleep.  Chronic kidney disease stage IIIb. Renal function still stable.  COPD. No bronchospasm.  Chronic diastolic congestive heart failure. Stable, no exacerbation.    Subjective:  Patient woke up this morning, no additional confusion.  Physical Exam: Vitals:   03/13/23 1030 03/13/23 1100 03/13/23 1130 03/13/23 1200  BP: 105/65 (!) 84/57 (!) 95/56 109/68  Pulse: 91 91 90 91  Resp: 12 13 13 16   Temp:       TempSrc:      SpO2: 100% 98% 98% 100%  Weight:      Height:       General exam: Appears calm and comfortable, morbidly obese. Respiratory system: Decreased breath sounds. Respiratory effort normal. Cardiovascular system: S1 & S2 heard, RRR. No JVD, murmurs, rubs, gallops or clicks. No pedal edema. Gastrointestinal system: Abdomen is nondistended, soft and nontender. No organomegaly or masses felt. Normal bowel sounds heard. Central nervous system: Alert and oriented x2. No focal neurological deficits. Extremities: Symmetric 5 x 5 power. Skin: No rashes, lesions or ulcers Psychiatry: Judgement and insight appear normal. Mood & affect appropriate.    Data Reviewed:  Lab results reviewed.  Family Communication: daughter updated.   Disposition: Status is: Inpatient Remains inpatient appropriate because: severity of disease     Time spent: 35 minutes  Author: Marrion Coy, MD 03/13/2023 12:20 PM  For on call review [email or web address].

## 2023-03-13 NOTE — ED Notes (Signed)
Pts family at bedside. Pts husband passed away Tues and family are trying to make arrangements for his funeral. Pt is asking if she will be able to attend. Pt is able to answer orientation questions about what happened and where she is but is off on time. Pt is very tearful at this time.

## 2023-03-14 DIAGNOSIS — I5032 Chronic diastolic (congestive) heart failure: Secondary | ICD-10-CM | POA: Diagnosis not present

## 2023-03-14 DIAGNOSIS — Z6841 Body Mass Index (BMI) 40.0 and over, adult: Secondary | ICD-10-CM

## 2023-03-14 DIAGNOSIS — K7682 Hepatic encephalopathy: Secondary | ICD-10-CM | POA: Diagnosis not present

## 2023-03-14 DIAGNOSIS — K7581 Nonalcoholic steatohepatitis (NASH): Secondary | ICD-10-CM | POA: Diagnosis not present

## 2023-03-14 LAB — BASIC METABOLIC PANEL
Anion gap: 7 (ref 5–15)
BUN: 39 mg/dL — ABNORMAL HIGH (ref 8–23)
CO2: 26 mmol/L (ref 22–32)
Calcium: 9.2 mg/dL (ref 8.9–10.3)
Chloride: 101 mmol/L (ref 98–111)
Creatinine, Ser: 1.54 mg/dL — ABNORMAL HIGH (ref 0.44–1.00)
GFR, Estimated: 36 mL/min — ABNORMAL LOW (ref >60)
Glucose, Bld: 90 mg/dL (ref 70–99)
Potassium: 4.3 mmol/L (ref 3.5–5.1)
Sodium: 134 mmol/L — ABNORMAL LOW (ref 135–145)

## 2023-03-14 LAB — CULTURE, BLOOD (ROUTINE X 2): Culture: NO GROWTH

## 2023-03-14 LAB — CBC
HCT: 29 % — ABNORMAL LOW (ref 36.0–46.0)
Hemoglobin: 9.5 g/dL — ABNORMAL LOW (ref 12.0–15.0)
MCH: 32 pg (ref 26.0–34.0)
MCHC: 32.8 g/dL (ref 30.0–36.0)
MCV: 97.6 fL (ref 80.0–100.0)
Platelets: 129 10*3/uL — ABNORMAL LOW (ref 150–400)
RBC: 2.97 MIL/uL — ABNORMAL LOW (ref 3.87–5.11)
RDW: 15.9 % — ABNORMAL HIGH (ref 11.5–15.5)
WBC: 5 10*3/uL (ref 4.0–10.5)
nRBC: 0 % (ref 0.0–0.2)

## 2023-03-14 LAB — GLUCOSE, CAPILLARY: Glucose-Capillary: 82 mg/dL (ref 70–99)

## 2023-03-14 LAB — MAGNESIUM: Magnesium: 2.1 mg/dL (ref 1.7–2.4)

## 2023-03-14 MED ORDER — RIFAXIMIN 550 MG PO TABS: 550.0000 mg | ORAL_TABLET | Freq: Two times a day (BID) | ORAL | 0 refills | Status: AC

## 2023-03-14 NOTE — TOC CM/SW Note (Signed)
    Durable Medical Equipment  (From admission, onward)           Start     Ordered   03/14/23 1037  For home use only DME lightweight manual wheelchair with seat cushion  Once       Comments: Patient suffers from Liver cirrhosis, weakness which impairs their ability to perform daily activities like bathing, dressing, and toileting in the home.  A walker will not resolve  issue with performing activities of daily living. A wheelchair will allow patient to safely perform daily activities. Patient is not able to propel themselves in the home using a standard weight wheelchair due to general weakness. Patient can self propel in the lightweight wheelchair. Length of need Lifetime. Accessories: elevating leg rests (ELRs), wheel locks, extensions and anti-tippers.   03/14/23 1037

## 2023-03-14 NOTE — TOC Transition Note (Signed)
Transition of Care [facility]) - CM/SW Discharge Note   Patient Details  Name: Carla Huerta MRN: [id number] Date of Birth: 01-30-1951  Transition of [facility]) CM/SW Contact:  Margarito Liner, LCSW Phone Number: 03/14/2023, 11:13 AM   Clinical Narrative:  Patient has orders to discharge home today. Readmission prevention screen started but then son and granddaughter needed her to complete paperwork in regards to her husband's funeral. PCP is Hillery Aldo, MD at [facility]. She is active with [facility] for PT. MD added OT, RN, aide to order which liaison confirmed they can provide. Per MD, family requesting wheelchair. Ordered through [employer] and asked them to see if she qualifies for bariatric which family prefers. Patient's granddaughter will transport her home today. Son will call her and ask her to bring patient's oxygen for the ride home. No further concerns. CSW signing off.   Final next level of care: Home w Home Health Services Barriers to Discharge: No Barriers Identified   Patient Goals and CMS Choice   Choice offered to / list presented to : NA  Discharge Placement                  Patient to be transferred to facility by: Granddaughter Name of family member notified: Carla Huerta Patient and family notified of of transfer: 03/14/23  Discharge Plan and Services Additional resources added to the After Visit Summary for                  DME Arranged: Lightweight manual wheelchair with seat cushion DME Agency: [employer] Date DME Agency Contacted: 03/14/23   Representative spoke with at DME Agency: Tommy[facility] HH Arranged: RN, PT, OT, Nurse's Aide [facility] Agency: [facility] Date [facility] Agency Contacted: 03/14/23   Representative spoke with at [facility] Agency: Gearldine Bienenstock  Social Determinants of Health (SDOH) Interventions SDOH Screenings   Food Insecurity: No Food Insecurity (03/13/2023)  Housing: Low Risk  (03/13/2023)  Transportation Needs: No Transportation  Needs (03/13/2023)  Utilities: Not At Risk (03/13/2023)  Tobacco Use: High Risk (01/24/2023)     Readmission Risk Interventions    03/14/2023   11:12 AM  Readmission Risk Prevention Plan  PCP or Specialist appointment within 3-5 days of discharge Complete  HRI or Home Care Consult Complete  SW Recovery Care/Counseling Consult Complete  Palliative Care Screening Not Applicable  Skilled Nursing Facility Not Applicable

## 2023-03-14 NOTE — Discharge Summary (Signed)
Physician Discharge Summary   Patient: Carla Huerta MRN: [id number] DOB: Aug 10, 1951  Admit date:     03/12/2023  Discharge date: 03/14/23  Discharge Physician: Marrion Coy   PCP: Hillery Aldo, MD   Recommendations at discharge:   Follow-up with PCP in 1 week. Check a BMP at next office visit. May restart diuretics at the next office visit.  Discharge Diagnoses: Principal Problem:   AMS (altered mental status) Active Problems:   Hepatic encephalopathy (HCC)   Liver cirrhosis secondary to NASH (nonalcoholic steatohepatitis) (HCC)   Morbid obesity with BMI of 45.0-49.9, adult (HCC)   Obstructive sleep apnea   Thrombocytopenia, acquired (HCC)   Chronic kidney disease, stage 3b (HCC)   Chronic diastolic CHF (congestive heart failure) (HCC)   COPD (chronic obstructive pulmonary disease) (HCC)   Hyponatremia  Resolved Problems:   * No resolved hospital problems. *  Hospital Course: Carla Huerta is a 72 y.o. female with medical history significant of liver cirrhosis, prior episodes of hepatic encephalopathy, chronic diastolic congestive heart failure, COPD, essential hypertension, obstructive sleep apnea and morbid obesity who present to the hospital with altered mental status. She is diagnosed with hepatic cephalopathy, was started on rectal lactulose. Patient condition quickly improved, she is medically stable to be discharged. Assessment and Plan: Hepatic encephalopathy Liver cirrhosis secondary to nonalcoholic steatohepatitis. Patient has significant altered mental status, consistent with hepatic cephalopathy. Patient is treated with lactulose per rectum, rifaximin.  Her mental status much improved today, medically stable to be discharged.  Her lactulose I will change to oral, will continue, she is advised to take lactulose daily to prevent recurrence of hepatic encephalopathy. Initially there is EEG ordered, but based on patient history, patient had multiple admission due  to hepatic encephalopathy, there is no concern for seizure.  Canceled EEG.   Hyponatremia. 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 Sodium level stable.   Morbid obesity.  BMI 47.95. Obstructive sleep apnea. Diet and exercise.  CPAP while asleep.   Chronic kidney disease stage IIIb. Renal function still stable.   COPD. No bronchospasm.   Chronic diastolic congestive heart failure. Stable, no exacerbation. Due to acute hepatic cephalopathy, I will have her diuretics, may consider restart them at next office visit.          Consultants: None Procedures performed: None  Disposition: Home health Diet recommendation:  Discharge Diet Orders (From admission, onward)     Start     Ordered   03/14/23 0000  Diet general       Comments: Dysphagia 3 diet (mechanical soft)   03/14/23 1041           Dysphagia type 3 thin Liquid DISCHARGE MEDICATION: Allergies as of 03/14/2023       Reactions   Latex Rash   Local reaction where it touches her body        Medication List     STOP taking these medications    bisacodyl 10 MG suppository Commonly known as: Dulcolax   bisacodyl 5 MG EC tablet Commonly known as: DULCOLAX   furosemide 40 MG tablet Commonly known as: Lasix   lisinopril 10 MG tablet Commonly known as: ZESTRIL   spironolactone 50 MG tablet Commonly known as: ALDACTONE       TAKE these medications    albuterol 108 (90 Base) MCG/ACT inhaler Commonly known as: VENTOLIN HFA Inhale 2 puffs into the lungs every 4 (four) hours as needed for wheezing or shortness of breath.   alendronate 70  MG tablet Commonly known as: FOSAMAX Take 70 mg by mouth every Wednesday.   allopurinol 100 MG tablet Commonly known as: ZYLOPRIM Take 100 mg by mouth daily.   aspirin 81 MG chewable tablet Chew 81 mg by mouth at bedtime.   atorvastatin 40 MG tablet Commonly known as: LIPITOR Take 40 mg by mouth daily.   ferrous sulfate 325 (65 FE) MG EC tablet Take  325 mg by mouth daily with breakfast.   gabapentin 300 MG capsule Commonly known as: NEURONTIN Take 2 capsules (600 mg total) by mouth at bedtime.   imipramine 50 MG tablet Commonly known as: TOFRANIL Take 100 mg by mouth at bedtime.   lactulose 10 GM/15ML solution Commonly known as: CHRONULAC Take 20 g (30 mL) 1-3 times a day for a goal of 1-2 bowel movements a day.   LORazepam 0.5 MG tablet Commonly known as: ATIVAN Take 0.5 mg by mouth 2 (two) times daily as needed.   omeprazole 40 MG capsule Commonly known as: PRILOSEC Take 40 mg by mouth daily.   rifaximin 550 MG Tabs tablet Commonly known as: XIFAXAN Take 1 tablet (550 mg total) by mouth 2 (two) times daily.   tolterodine 4 MG 24 hr capsule Commonly known as: DETROL LA Take 4 mg by mouth at bedtime.               Durable Medical Equipment  (From admission, onward)           Start     Ordered   03/14/23 1037  For home use only DME lightweight manual wheelchair with seat cushion  Once       Comments: Patient suffers from Liver cirrhosis, weakness which impairs their ability to perform daily activities like bathing, dressing, and toileting in the home.  A walker will not resolve  issue with performing activities of daily living. A wheelchair will allow patient to safely perform daily activities. Patient is not able to propel themselves in the home using a standard weight wheelchair due to general weakness. Patient can self propel in the lightweight wheelchair. Length of need Lifetime. Accessories: elevating leg rests (ELRs), wheel locks, extensions and anti-tippers.   03/14/23 1037            Follow-up Information     Hillery Aldo, MD Follow up in 1 week(s).   Specialty: Family Medicine Contact information: [address] [place] [address] [phone]                Discharge Exam: Ceasar Mons Weights   03/12/23 1558  Weight: 130.7 kg   General exam: Appears calm and  comfortable, morbidly obese. Respiratory system: Clear to auscultation. Respiratory effort normal. Cardiovascular system: S1 & S2 heard, RRR. No JVD, murmurs, rubs, gallops or clicks. No pedal edema. Gastrointestinal system: Abdomen is nondistended, soft and nontender. No organomegaly or masses felt. Normal bowel sounds heard. Central nervous system: Alert and oriented x3. No focal neurological deficits. Extremities: Symmetric 5 x 5 power. Skin: No rashes, lesions or ulcers Psychiatry: Judgement and insight appear normal. Mood & affect appropriate.    Condition at discharge: fair  The results of significant diagnostics from this hospitalization (including imaging, microbiology, ancillary and laboratory) are listed below for reference.   Imaging Studies: MR BRAIN W WO CONTRAST  Result Date: 03/12/2023 CLINICAL DATA:  Altered mental status EXAM: MRI HEAD WITHOUT AND WITH CONTRAST TECHNIQUE: Multiplanar, multiecho pulse sequences of the brain and surrounding structures were obtained without and with intravenous  contrast. CONTRAST:  10mL GADAVIST GADOBUTROL 1 MMOL/ML IV SOLN COMPARISON:  No prior MRI available, correlation is made with CT head 03/12/2023 FINDINGS: Evaluation is limited by motion. Brain: The hippocampi are symmetric in size and signal, although dedicated seizure sequences are particularly motion limited. No heterotopia or evidence of cortical dysgenesis. No restricted diffusion to suggest acute or subacute infarct. No abnormal parenchymal or meningeal enhancement. No acute hemorrhage, mass, mass effect, or midline shift. No hydrocephalus or extra-axial collection. Normal pituitary and craniocervical junction. No hemosiderin deposition to suggest remote hemorrhage. T2 hyperintense signal in the periventricular white matter, likely the sequela of mild-to-moderate chronic small vessel ischemic disease. Vascular: Normal arterial flow voids. Normal arterial and venous enhancement. Skull and  upper cervical spine: Normal marrow signal. Sinuses/Orbits: Clear paranasal sinuses. No acute finding in the orbits. Other: Fluid in the bilateral mastoid air cells. IMPRESSION: Evaluation is limited by motion. Within this limitation, no acute intracranial process. No etiology is seen for the patient's altered mental status. Electronically Signed   By: Wiliam Ke M.D.   On: 03/12/2023 21:28   DG Spine Portable 1 View  Result Date: 03/12/2023 CLINICAL DATA:  Fall at home.  With pain. EXAM: PORTABLE SPINE - 1 VIEW COMPARISON:  Abdominal CT 12/04/2022 FINDINGS: Grossly aligned and intact lumbar spine in the frontal projection, very limited however. Spondylitic spurring especially at the thoracolumbar junction and degenerative facet spurring at the lumbosacral junction. Moderate stool and gas in the colon. IMPRESSION: Very limited study due to single projection only image of the spine. No acute finding. Electronically Signed   By: Tiburcio Pea M.D.   On: 03/12/2023 20:52   DG Spine Portable 1 View  Result Date: 03/12/2023 CLINICAL DATA:  Vomiting. EXAM: PORTABLE SPINE - 1 VIEW COMPARISON:  Chest radiograph dated 03/12/2023. FINDINGS: No acute fracture thoracic spine on the single AP view. Degenerative changes and mild scoliosis. IMPRESSION: No acute fracture on the single AP view. Electronically Signed   By: Elgie Collard M.D.   On: 03/12/2023 20:52   DG Abd 1 View  Result Date: 03/12/2023 CLINICAL DATA:  Vomiting EXAM: ABDOMEN - 1 VIEW COMPARISON:  None Available. FINDINGS: The bowel gas pattern is normal. No radio-opaque calculi or other significant radiographic abnormality are seen. IMPRESSION: Negative. Electronically Signed   By: Darliss Cheney M.D.   On: 03/12/2023 20:50   CT Head Wo Contrast  Result Date: 03/12/2023 CLINICAL DATA:  Altered mental status EXAM: CT HEAD WITHOUT CONTRAST TECHNIQUE: Contiguous axial images were obtained from the base of the skull through the vertex without  intravenous contrast. RADIATION DOSE REDUCTION: This exam was performed according to the departmental dose-optimization program which includes automated exposure control, adjustment of the mA and/or kV according to patient size and/or use of iterative reconstruction technique. COMPARISON:  12/04/2022 FINDINGS: Brain: No acute intracranial findings are seen. There are no signs of bleeding within the cranium. There is no focal edema or mass effect. Cortical sulci are prominent. There is decreased density in periventricular white matter. Small old lacunar infarct is noted in right basal ganglia with no significant change. Vascular: Unremarkable. Skull: No acute findings are seen. Sinuses/Orbits: Unremarkable. Other: No significant interval changes are noted. IMPRESSION: No acute intracranial findings are seen in noncontrast CT brain. Atrophy. Electronically Signed   By: Ernie Avena M.D.   On: 03/12/2023 17:55   DG Chest Port 1 View  Result Date: 03/12/2023 CLINICAL DATA:  Shortness of breath. EXAM: PORTABLE CHEST 1 VIEW COMPARISON:  December 01, 2022. FINDINGS: Stable cardiomegaly. Mild central pulmonary vascular congestion is noted. Probable mild bilateral pulmonary edema is noted. Bony thorax is unremarkable. IMPRESSION: Stable cardiomegaly with mild central pulmonary vascular congestion and probable bilateral pulmonary edema. Electronically Signed   By: Lupita Raider M.D.   On: 03/12/2023 16:41    Microbiology: Results for orders placed or performed during the hospital encounter of 03/12/23  Culture, blood (routine x 2)     Status: None (Preliminary result)   Collection Time: 03/12/23  4:05 PM   Specimen: BLOOD  Result Value Ref Range Status   Specimen Description BLOOD BLOOD LEFT FOREARM  Final   Special Requests   Final    BOTTLES DRAWN AEROBIC AND ANAEROBIC Blood Culture results may not be optimal due to an excessive volume of blood received in culture bottles   Culture   Final    NO  GROWTH 2 DAYS Performed at [facility], [address] [place], [place] [address]    Report Status PENDING  Incomplete  Culture, blood (routine x 2)     Status: None (Preliminary result)   Collection Time: 03/12/23  4:05 PM   Specimen: BLOOD  Result Value Ref Range Status   Specimen Description BLOOD RIGHT ANTECUBITAL  Final   Special Requests   Final    BOTTLES DRAWN AEROBIC AND ANAEROBIC Blood Culture results may not be optimal due to an excessive volume of blood received in culture bottles   Culture   Final    NO GROWTH 2 DAYS Performed at [facility], [address], [place], [place] [address]    Report Status PENDING  Incomplete  SARS Coronavirus 2 by RT PCR (hospital order, performed in [facility] hospital lab) *cepheid single result test* Anterior Nasal Swab     Status: None   Collection Time: 03/12/23  4:47 PM   Specimen: Anterior Nasal Swab  Result Value Ref Range Status   SARS Coronavirus 2 by RT PCR NEGATIVE NEGATIVE Final    Comment: (NOTE) SARS-CoV-2 target nucleic acids are NOT DETECTED.  The SARS-CoV-2 RNA is generally detectable in upper and lower respiratory specimens during the acute phase of infection. The lowest concentration of SARS-CoV-2 viral copies this assay can detect is 250 copies / mL. A negative result does not preclude SARS-CoV-2 infection and should not be used as the sole basis for treatment or other patient management decisions.  A negative result may occur with improper specimen collection / handling, submission of specimen other than nasopharyngeal swab, presence of viral mutation(s) within the areas targeted by this assay, and inadequate number of viral copies (<250 copies / mL). A negative result must be combined with clinical observations, patient history, and epidemiological information.  Fact Sheet for Patients:   RoadLapTop.co.za  Fact Sheet for Healthcare  Providers: http://kim-miller.com/  This test is not yet approved or  cleared by the [place] FDA and has been authorized for detection and/or diagnosis of SARS-CoV-2 by FDA under an Emergency Use Authorization (EUA).  This EUA will remain in effect (meaning this test can be used) for the duration of the COVID-19 declaration under Section 564(b)(1) of the Act, 21 U.S.C. section 360bbb-3(b)(1), unless the authorization is terminated or revoked sooner.  Performed at [facility], [address] [place], [place] [address]     Labs: CBC: Recent Labs  Lab 03/12/23 1605 03/13/23 0040 03/13/23 0437 03/14/23 0604  WBC 5.7 5.5 5.6 5.0  NEUTROABS 3.9  --   --   --  HGB 10.6* 10.5* 10.3* 9.5*  HCT 31.7* 32.1* 31.3* 29.0*  MCV 94.6 96.4 96.6 97.6  PLT 139* 151 144* 129*   Basic Metabolic Panel: Recent Labs  Lab 03/12/23 1605 03/13/23 0040 03/13/23 0437 03/14/23 0604  NA 129* 132* 134* 134*  K 3.7 3.9 3.9 4.3  CL 97* 96* 98 101  CO2 22 26 25 26   GLUCOSE 109* 107* 103* 90  BUN 33* 34* 35* 39*  CREATININE 1.31* 1.42* 1.49* 1.54*  CALCIUM 9.3 9.4 9.2 9.2  MG  --   --   --  2.1   Liver Function Tests: Recent Labs  Lab 03/12/23 1605  AST 49*  ALT 28  ALKPHOS 81  BILITOT 1.2  PROT 6.7  ALBUMIN 3.1*   CBG: Recent Labs  Lab 03/13/23 0816 03/13/23 1114 03/13/23 1531 03/13/23 2135 03/14/23 0804  GLUCAP 85 92 80 102* 82    Discharge time spent: greater than 30 minutes.  Signed: Marrion Coy, MD [facility] 03/14/2023

## 2023-03-17 ENCOUNTER — Ambulatory Visit: Payer: Medicare HMO | Admitting: Gastroenterology

## 2023-03-17 LAB — CULTURE, BLOOD (ROUTINE X 2)

## 2023-03-17 NOTE — Progress Notes (Deleted)
Primary Care Physician: Hillery Aldo, MD  Primary Gastroenterologist:  Dr. Midge Minium  No chief complaint on file.   HPI: Carla Huerta is a 72 y.o. 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  Past Medical History:  Diagnosis Date   Anemia    CHF (congestive heart failure) (HCC)    COPD (chronic obstructive pulmonary disease) (HCC)    Cough    Depression    Diaphragm paralysis    Gout    Hypertension    Hyponatremia    Insomnia    Liver disease    Nonalcoholic hepatosteatosis    PONV (postoperative nausea and vomiting)    Renal disorder    Sleep apnea    Splenic laceration    Vertigo     Current Outpatient Medications  Medication Sig Dispense Refill   albuterol (PROVENTIL HFA;VENTOLIN HFA) 108 (90 Base) MCG/ACT inhaler Inhale 2 puffs into the lungs every 4 (four) hours as needed for wheezing or shortness of breath.     alendronate (FOSAMAX) 70 MG tablet Take 70 mg by mouth every Wednesday.      allopurinol (ZYLOPRIM) 100 MG tablet Take 100 mg by mouth daily.     aspirin 81 MG chewable tablet Chew 81 mg by mouth at bedtime.     atorvastatin (LIPITOR) 40 MG tablet Take 40 mg by mouth daily.      ferrous sulfate 325 (65 FE) MG EC tablet Take 325 mg by mouth daily with breakfast.     gabapentin (NEURONTIN) 300 MG capsule Take 2 capsules (600 mg total) by mouth at bedtime. 60 capsule 0   imipramine (TOFRANIL) 50 MG tablet Take 100 mg by mouth at bedtime.      lactulose (CHRONULAC) 10 GM/15ML solution Take 20 g (30 mL) 1-3 times a day for a goal of 1-2 bowel movements a day. 473 mL 2   LORazepam (ATIVAN) 0.5 MG tablet Take 0.5 mg by mouth 2 (two) times daily as needed.     omeprazole (PRILOSEC) 40 MG capsule Take 40 mg by mouth daily.      rifaximin (XIFAXAN) 550 MG TABS tablet Take 1 tablet (550 mg total) by mouth 2 (two) times  daily. 60 tablet 0   tolterodine (DETROL LA) 4 MG 24 hr capsule Take 4 mg by mouth at bedtime.     No current facility-administered medications for this visit.    Allergies as of 03/17/2023 - Review Complete 03/14/2023  Allergen Reaction Noted   Latex Rash 12/04/2022    ROS:  General: Negative for anorexia, weight loss, fever, chills, fatigue, weakness. ENT: Negative for hoarseness, difficulty swallowing , nasal congestion. CV: Negative for chest pain, angina, palpitations, dyspnea on exertion, peripheral edema.  Respiratory: Negative for dyspnea at rest, dyspnea on exertion, cough, sputum, wheezing.  GI: See history of present illness. GU:  Negative for dysuria, hematuria, urinary incontinence, urinary frequency, nocturnal urination.  Endo: Negative for unusual weight change.    Physical Examination:   There were no vitals taken for this visit.  General: Well-nourished, well-developed in no acute distress.  Eyes: No icterus. Conjunctivae pink. Lungs: Clear to auscultation bilaterally. Non-labored. Heart: Regular rate and rhythm, no murmurs rubs or gallops.  Abdomen: Bowel sounds are normal, nontender, nondistended, no hepatosplenomegaly or masses, no abdominal bruits or hernia , no rebound or guarding.   Extremities: No  lower extremity edema. No clubbing or deformities. Neuro: Alert and oriented x 3.  Grossly intact. Skin: Warm and dry, no jaundice.   Psych: Alert and cooperative, normal mood and affect.  Labs:    Imaging Studies: MR BRAIN W WO CONTRAST  Result Date: 03/12/2023 CLINICAL DATA:  Altered mental status EXAM: MRI HEAD WITHOUT AND WITH CONTRAST TECHNIQUE: Multiplanar, multiecho pulse sequences of the brain and surrounding structures were obtained without and with intravenous contrast. CONTRAST:  10mL GADAVIST GADOBUTROL 1 MMOL/ML IV SOLN COMPARISON:  No prior MRI available, correlation is made with CT head 03/12/2023 FINDINGS: Evaluation is limited by motion. Brain:  The hippocampi are symmetric in size and signal, although dedicated seizure sequences are particularly motion limited. No heterotopia or evidence of cortical dysgenesis. No restricted diffusion to suggest acute or subacute infarct. No abnormal parenchymal or meningeal enhancement. No acute hemorrhage, mass, mass effect, or midline shift. No hydrocephalus or extra-axial collection. Normal pituitary and craniocervical junction. No hemosiderin deposition to suggest remote hemorrhage. T2 hyperintense signal in the periventricular white matter, likely the sequela of mild-to-moderate chronic small vessel ischemic disease. Vascular: Normal arterial flow voids. Normal arterial and venous enhancement. Skull and upper cervical spine: Normal marrow signal. Sinuses/Orbits: Clear paranasal sinuses. No acute finding in the orbits. Other: Fluid in the bilateral mastoid air cells. IMPRESSION: Evaluation is limited by motion. Within this limitation, no acute intracranial process. No etiology is seen for the patient's altered mental status. Electronically Signed   By: Wiliam Ke M.D.   On: 03/12/2023 21:28   DG Spine Portable 1 View  Result Date: 03/12/2023 CLINICAL DATA:  Fall at home.  With pain. EXAM: PORTABLE SPINE - 1 VIEW COMPARISON:  Abdominal CT 12/04/2022 FINDINGS: Grossly aligned and intact lumbar spine in the frontal projection, very limited however. Spondylitic spurring especially at the thoracolumbar junction and degenerative facet spurring at the lumbosacral junction. Moderate stool and gas in the colon. IMPRESSION: Very limited study due to single projection only image of the spine. No acute finding. Electronically Signed   By: Tiburcio Pea M.D.   On: 03/12/2023 20:52   DG Spine Portable 1 View  Result Date: 03/12/2023 CLINICAL DATA:  Vomiting. EXAM: PORTABLE SPINE - 1 VIEW COMPARISON:  Chest radiograph dated 03/12/2023. FINDINGS: No acute fracture thoracic spine on the single AP view. Degenerative changes  and mild scoliosis. IMPRESSION: No acute fracture on the single AP view. Electronically Signed   By: Elgie Collard M.D.   On: 03/12/2023 20:52   DG Abd 1 View  Result Date: 03/12/2023 CLINICAL DATA:  Vomiting EXAM: ABDOMEN - 1 VIEW COMPARISON:  None Available. FINDINGS: The bowel gas pattern is normal. No radio-opaque calculi or other significant radiographic abnormality are seen. IMPRESSION: Negative. Electronically Signed   By: Darliss Cheney M.D.   On: 03/12/2023 20:50   CT Head Wo Contrast  Result Date: 03/12/2023 CLINICAL DATA:  Altered mental status EXAM: CT HEAD WITHOUT CONTRAST TECHNIQUE: Contiguous axial images were obtained from the base of the skull through the vertex without intravenous contrast. RADIATION DOSE REDUCTION: This exam was performed according to the departmental dose-optimization program which includes automated exposure control, adjustment of the mA and/or kV according to patient size and/or use of iterative reconstruction technique. COMPARISON:  12/04/2022 FINDINGS: Brain: No acute intracranial findings are seen. There are no signs of bleeding within the cranium. There is no focal edema or mass effect. Cortical sulci are prominent. There is decreased density in periventricular white matter. Small old lacunar  infarct is noted in right basal ganglia with no significant change. Vascular: Unremarkable. Skull: No acute findings are seen. Sinuses/Orbits: Unremarkable. Other: No significant interval changes are noted. IMPRESSION: No acute intracranial findings are seen in noncontrast CT brain. Atrophy. Electronically Signed   By: Ernie Avena M.D.   On: 03/12/2023 17:55   DG Chest Port 1 View  Result Date: 03/12/2023 CLINICAL DATA:  Shortness of breath. EXAM: PORTABLE CHEST 1 VIEW COMPARISON:  December 01, 2022. FINDINGS: Stable cardiomegaly. Mild central pulmonary vascular congestion is noted. Probable mild bilateral pulmonary edema is noted. Bony thorax is unremarkable.  IMPRESSION: Stable cardiomegaly with mild central pulmonary vascular congestion and probable bilateral pulmonary edema. Electronically Signed   By: Lupita Raider M.D.   On: 03/12/2023 16:41    Assessment and Plan:   Carla Huerta is a 72 y.o. y/o female ***     Midge Minium, MD. Clementeen Graham    Note: This dictation was prepared with Dragon dictation along with smaller phrase technology. Any transcriptional errors that result from this process are unintentional.

## 2023-04-01 ENCOUNTER — Encounter: Payer: Medicare HMO | Admitting: Family

## 2023-04-01 ENCOUNTER — Telehealth: Payer: Self-pay | Admitting: Family

## 2023-04-01 NOTE — Telephone Encounter (Signed)
Patient did not show for her [facility] appointment on 04/01/23.

## 2023-08-30 ENCOUNTER — Other Ambulatory Visit: Payer: Self-pay

## 2023-08-30 ENCOUNTER — Emergency Department: Payer: Medicare HMO

## 2023-08-30 ENCOUNTER — Inpatient Hospital Stay
Admission: EM | Admit: 2023-08-30 | Discharge: 2023-09-02 | DRG: 871 | Disposition: A | Discharge status: Home Health | Payer: Medicare HMO | Attending: Internal Medicine | Admitting: Internal Medicine

## 2023-08-30 DIAGNOSIS — Z87891 Personal history of nicotine dependence: Secondary | ICD-10-CM

## 2023-08-30 DIAGNOSIS — A4151 Sepsis due to Escherichia coli [E. coli]: Principal | ICD-10-CM | POA: Diagnosis present

## 2023-08-30 DIAGNOSIS — N390 Urinary tract infection, site not specified: Secondary | ICD-10-CM | POA: Diagnosis present

## 2023-08-30 DIAGNOSIS — Z7951 Long term (current) use of inhaled steroids: Secondary | ICD-10-CM

## 2023-08-30 DIAGNOSIS — K7581 Nonalcoholic steatohepatitis (NASH): Secondary | ICD-10-CM | POA: Diagnosis present

## 2023-08-30 DIAGNOSIS — R6520 Severe sepsis without septic shock: Secondary | ICD-10-CM | POA: Diagnosis present

## 2023-08-30 DIAGNOSIS — R1310 Dysphagia, unspecified: Secondary | ICD-10-CM | POA: Diagnosis present

## 2023-08-30 DIAGNOSIS — M109 Gout, unspecified: Secondary | ICD-10-CM | POA: Diagnosis present

## 2023-08-30 DIAGNOSIS — F32A Depression, unspecified: Secondary | ICD-10-CM | POA: Diagnosis present

## 2023-08-30 DIAGNOSIS — K7460 Unspecified cirrhosis of liver: Secondary | ICD-10-CM | POA: Diagnosis present

## 2023-08-30 DIAGNOSIS — I5032 Chronic diastolic (congestive) heart failure: Secondary | ICD-10-CM | POA: Diagnosis present

## 2023-08-30 DIAGNOSIS — E871 Hypo-osmolality and hyponatremia: Secondary | ICD-10-CM | POA: Diagnosis present

## 2023-08-30 DIAGNOSIS — F419 Anxiety disorder, unspecified: Secondary | ICD-10-CM | POA: Diagnosis present

## 2023-08-30 DIAGNOSIS — Z515 Encounter for palliative care: Secondary | ICD-10-CM

## 2023-08-30 DIAGNOSIS — G928 Other toxic encephalopathy: Secondary | ICD-10-CM | POA: Diagnosis present

## 2023-08-30 DIAGNOSIS — E66813 Obesity, class 3: Secondary | ICD-10-CM | POA: Diagnosis present

## 2023-08-30 DIAGNOSIS — Z1152 Encounter for screening for COVID-19: Secondary | ICD-10-CM | POA: Diagnosis not present

## 2023-08-30 DIAGNOSIS — I130 Hypertensive heart and chronic kidney disease with heart failure and stage 1 through stage 4 chronic kidney disease, or unspecified chronic kidney disease: Secondary | ICD-10-CM | POA: Diagnosis present

## 2023-08-30 DIAGNOSIS — Z6841 Body Mass Index (BMI) 40.0 and over, adult: Secondary | ICD-10-CM

## 2023-08-30 DIAGNOSIS — E869 Volume depletion, unspecified: Secondary | ICD-10-CM | POA: Diagnosis present

## 2023-08-30 DIAGNOSIS — N1832 Chronic kidney disease, stage 3b: Secondary | ICD-10-CM | POA: Diagnosis present

## 2023-08-30 DIAGNOSIS — E876 Hypokalemia: Secondary | ICD-10-CM | POA: Diagnosis present

## 2023-08-30 DIAGNOSIS — E872 Acidosis, unspecified: Secondary | ICD-10-CM | POA: Diagnosis present

## 2023-08-30 DIAGNOSIS — D6959 Other secondary thrombocytopenia: Secondary | ICD-10-CM | POA: Diagnosis present

## 2023-08-30 DIAGNOSIS — R402422 Glasgow coma scale score 9-12, at arrival to emergency department: Secondary | ICD-10-CM

## 2023-08-30 DIAGNOSIS — Z79899 Other long term (current) drug therapy: Secondary | ICD-10-CM

## 2023-08-30 DIAGNOSIS — Z96651 Presence of right artificial knee joint: Secondary | ICD-10-CM | POA: Diagnosis present

## 2023-08-30 DIAGNOSIS — Z803 Family history of malignant neoplasm of breast: Secondary | ICD-10-CM

## 2023-08-30 DIAGNOSIS — J441 Chronic obstructive pulmonary disease with (acute) exacerbation: Secondary | ICD-10-CM | POA: Diagnosis present

## 2023-08-30 DIAGNOSIS — Z66 Do not resuscitate: Secondary | ICD-10-CM | POA: Diagnosis present

## 2023-08-30 DIAGNOSIS — A419 Sepsis, unspecified organism: Secondary | ICD-10-CM | POA: Diagnosis present

## 2023-08-30 DIAGNOSIS — J9601 Acute respiratory failure with hypoxia: Secondary | ICD-10-CM | POA: Diagnosis present

## 2023-08-30 DIAGNOSIS — E785 Hyperlipidemia, unspecified: Secondary | ICD-10-CM | POA: Diagnosis present

## 2023-08-30 DIAGNOSIS — R4182 Altered mental status, unspecified: Secondary | ICD-10-CM | POA: Diagnosis not present

## 2023-08-30 DIAGNOSIS — T434X5A Adverse effect of butyrophenone and thiothixene neuroleptics, initial encounter: Secondary | ICD-10-CM | POA: Diagnosis present

## 2023-08-30 DIAGNOSIS — Z91040 Latex allergy status: Secondary | ICD-10-CM

## 2023-08-30 DIAGNOSIS — R400 Somnolence: Secondary | ICD-10-CM | POA: Diagnosis not present

## 2023-08-30 DIAGNOSIS — G4733 Obstructive sleep apnea (adult) (pediatric): Secondary | ICD-10-CM | POA: Diagnosis present

## 2023-08-30 DIAGNOSIS — I959 Hypotension, unspecified: Secondary | ICD-10-CM | POA: Diagnosis present

## 2023-08-30 DIAGNOSIS — Z7982 Long term (current) use of aspirin: Secondary | ICD-10-CM

## 2023-08-30 DIAGNOSIS — B9620 Unspecified Escherichia coli [E. coli] as the cause of diseases classified elsewhere: Secondary | ICD-10-CM | POA: Diagnosis not present

## 2023-08-30 DIAGNOSIS — G9341 Metabolic encephalopathy: Secondary | ICD-10-CM | POA: Diagnosis not present

## 2023-08-30 DIAGNOSIS — R3129 Other microscopic hematuria: Secondary | ICD-10-CM | POA: Diagnosis present

## 2023-08-30 LAB — BLOOD CULTURE ID PANEL (REFLEXED) - BCID2

## 2023-08-30 LAB — URINALYSIS, W/ REFLEX TO CULTURE (INFECTION SUSPECTED)
Bilirubin Urine: NEGATIVE
Glucose, UA: NEGATIVE mg/dL
Ketones, ur: NEGATIVE mg/dL
Nitrite: NEGATIVE
Protein, ur: 100 mg/dL — AB
Specific Gravity, Urine: 1.017 (ref 1.005–1.030)
Squamous Epithelial / HPF: 0 /[HPF] (ref 0–5)
WBC, UA: >50 WBC/hpf (ref 0–5)
pH: 5 (ref 5.0–8.0)

## 2023-08-30 LAB — BLOOD GAS, ARTERIAL
Acid-base deficit: 4.2 mmol/L — ABNORMAL HIGH (ref 0.0–2.0)
Bicarbonate: 20.1 mmol/L (ref 20.0–28.0)
Delivery systems: POSITIVE
Expiratory PAP: 5 cm[H2O]
FIO2: 35 %
Inspiratory PAP: 10 cm[H2O]
O2 Saturation: 98.6 %
Patient temperature: 37
pCO2 arterial: 34 mm[Hg] (ref 32–48)
pH, Arterial: 7.38 (ref 7.35–7.45)
pO2, Arterial: 88 mm[Hg] (ref 83–108)

## 2023-08-30 LAB — CBC WITH DIFFERENTIAL/PLATELET
Abs Immature Granulocytes: 0.08 10*3/uL — ABNORMAL HIGH (ref 0.00–0.07)
Basophils Absolute: 0 10*3/uL (ref 0.0–0.1)
Basophils Relative: 0 %
Eosinophils Absolute: 0 10*3/uL (ref 0.0–0.5)
Eosinophils Relative: 0 %
HCT: 42.5 % (ref 36.0–46.0)
Hemoglobin: 14.2 g/dL (ref 12.0–15.0)
Immature Granulocytes: 1 %
Lymphocytes Relative: 4 %
Lymphs Abs: 0.4 10*3/uL — ABNORMAL LOW (ref 0.7–4.0)
MCH: 31.2 pg (ref 26.0–34.0)
MCHC: 33.4 g/dL (ref 30.0–36.0)
MCV: 93.4 fL (ref 80.0–100.0)
Monocytes Absolute: 1 10*3/uL (ref 0.1–1.0)
Monocytes Relative: 8 %
Neutro Abs: 10.4 10*3/uL — ABNORMAL HIGH (ref 1.7–7.7)
Neutrophils Relative %: 87 %
Platelets: 105 10*3/uL — ABNORMAL LOW (ref 150–400)
RBC: 4.55 MIL/uL (ref 3.87–5.11)
RDW: 15.2 % (ref 11.5–15.5)
WBC: 11.9 10*3/uL — ABNORMAL HIGH (ref 4.0–10.5)
nRBC: 0 % (ref 0.0–0.2)

## 2023-08-30 LAB — COMPREHENSIVE METABOLIC PANEL
ALT: 25 U/L (ref 0–44)
AST: 69 U/L — ABNORMAL HIGH (ref 15–41)
Albumin: 3.6 g/dL (ref 3.5–5.0)
Alkaline Phosphatase: 82 U/L (ref 38–126)
Anion gap: 11 (ref 5–15)
BUN: 24 mg/dL — ABNORMAL HIGH (ref 8–23)
CO2: 21 mmol/L — ABNORMAL LOW (ref 22–32)
Calcium: 9.5 mg/dL (ref 8.9–10.3)
Chloride: 100 mmol/L (ref 98–111)
Creatinine, Ser: 1.37 mg/dL — ABNORMAL HIGH (ref 0.44–1.00)
GFR, Estimated: 42 mL/min — ABNORMAL LOW (ref >60)
Glucose, Bld: 167 mg/dL — ABNORMAL HIGH (ref 70–99)
Potassium: 3.2 mmol/L — ABNORMAL LOW (ref 3.5–5.1)
Sodium: 132 mmol/L — ABNORMAL LOW (ref 135–145)
Total Bilirubin: 1.9 mg/dL — ABNORMAL HIGH (ref 0.3–1.2)
Total Protein: 7.8 g/dL (ref 6.5–8.1)

## 2023-08-30 LAB — RESP PANEL BY RT-PCR (RSV, FLU A&B, COVID)  RVPGX2
Influenza A by PCR: NEGATIVE
Influenza B by PCR: NEGATIVE
Resp Syncytial Virus by PCR: NEGATIVE
SARS Coronavirus 2 by RT PCR: NEGATIVE

## 2023-08-30 LAB — TSH: TSH: 0.631 u[IU]/mL (ref 0.350–4.500)

## 2023-08-30 LAB — HIV ANTIBODY (ROUTINE TESTING W REFLEX): HIV Screen 4th Generation wRfx: NONREACTIVE

## 2023-08-30 LAB — PROCALCITONIN: Procalcitonin: 0.65 ng/mL

## 2023-08-30 LAB — MRSA NEXT GEN BY PCR, NASAL: MRSA by PCR Next Gen: DETECTED — AB

## 2023-08-30 LAB — PROTIME-INR
INR: 2.1 — ABNORMAL HIGH (ref 0.8–1.2)
Prothrombin Time: 23.6 s — ABNORMAL HIGH (ref 11.4–15.2)

## 2023-08-30 LAB — GLUCOSE, CAPILLARY: Glucose-Capillary: 138 mg/dL — ABNORMAL HIGH (ref 70–99)

## 2023-08-30 LAB — AMMONIA: Ammonia: 34 umol/L (ref 9–35)

## 2023-08-30 LAB — LACTIC ACID, PLASMA
Lactic Acid, Venous: 3.9 mmol/L (ref 0.5–1.9)
Lactic Acid, Venous: 2.8 mmol/L (ref 0.5–1.9)

## 2023-08-30 LAB — BRAIN NATRIURETIC PEPTIDE: B Natriuretic Peptide: 117.4 pg/mL — ABNORMAL HIGH (ref 0.0–100.0)

## 2023-08-30 MED ORDER — ASPIRIN 81 MG PO CHEW
81.0000 mg | CHEWABLE_TABLET | Freq: Every day | ORAL | Status: DC
  Administered 2023-08-31 – 2023-09-01 (×2): 81 mg via ORAL
  Filled 2023-08-30 (×2): qty 1

## 2023-08-30 MED ORDER — METHYLPREDNISOLONE SODIUM SUCC 125 MG IJ SOLR
125.0000 mg | Freq: Once | INTRAMUSCULAR | Status: AC
  Administered 2023-08-30: 125 mg via INTRAVENOUS
  Filled 2023-08-30: qty 2

## 2023-08-30 MED ORDER — SODIUM CHLORIDE 0.9 % IV BOLUS
500.0000 mL | Freq: Once | INTRAVENOUS | Status: AC
  Administered 2023-08-30: 500 mL via INTRAVENOUS

## 2023-08-30 MED ORDER — SODIUM CHLORIDE 0.9 % IV BOLUS
1000.0000 mL | Freq: Once | INTRAVENOUS | Status: AC
  Administered 2023-08-30: 1000 mL via INTRAVENOUS

## 2023-08-30 MED ORDER — ATORVASTATIN CALCIUM 20 MG PO TABS: 40.0000 mg | ORAL_TABLET | Freq: Every day | ORAL | Status: DC

## 2023-08-30 MED ORDER — LORAZEPAM 2 MG/ML IJ SOLN
1.0000 mg | Freq: Once | INTRAMUSCULAR | Status: AC
  Administered 2023-08-30: 1 mg via INTRAVENOUS
  Filled 2023-08-30: qty 1

## 2023-08-30 MED ORDER — IMIPRAMINE HCL 50 MG PO TABS
100.0000 mg | ORAL_TABLET | Freq: Every day | ORAL | Status: DC
  Administered 2023-08-31 – 2023-09-01 (×2): 100 mg via ORAL
  Filled 2023-08-30 (×5): qty 2

## 2023-08-30 MED ORDER — POTASSIUM CHLORIDE 10 MEQ/100ML IV SOLN
10.0000 meq | INTRAVENOUS | Status: AC
  Administered 2023-08-30 (×3): 10 meq via INTRAVENOUS
  Filled 2023-08-30 (×3): qty 100

## 2023-08-30 MED ORDER — OLANZAPINE 10 MG IM SOLR
2.5000 mg | Freq: Once | INTRAMUSCULAR | Status: AC
  Administered 2023-08-30: 2.5 mg via INTRAMUSCULAR
  Filled 2023-08-30: qty 10

## 2023-08-30 MED ORDER — SODIUM CHLORIDE 0.9 % IV SOLN
2.0000 g | INTRAVENOUS | Status: DC
  Administered 2023-08-31 – 2023-09-02 (×3): 2 g via INTRAVENOUS
  Filled 2023-08-30 (×3): qty 20

## 2023-08-30 MED ORDER — VITAMIN K1 10 MG/ML IJ SOLN
5.0000 mg | Freq: Once | INTRAMUSCULAR | Status: AC
  Administered 2023-08-30: 5 mg via SUBCUTANEOUS
  Filled 2023-08-30: qty 1

## 2023-08-30 MED ORDER — ORAL CARE MOUTH RINSE: 15.0000 mL | OROMUCOSAL | Status: DC | PRN

## 2023-08-30 MED ORDER — ACETAMINOPHEN 325 MG PO TABS: 650.0000 mg | ORAL_TABLET | Freq: Four times a day (QID) | ORAL | Status: DC | PRN

## 2023-08-30 MED ORDER — HALOPERIDOL LACTATE 5 MG/ML IJ SOLN
2.0000 mg | Freq: Four times a day (QID) | INTRAMUSCULAR | Status: DC | PRN
  Administered 2023-08-30: 2 mg via INTRAVENOUS
  Filled 2023-08-30: qty 1

## 2023-08-30 MED ORDER — ZIPRASIDONE MESYLATE 20 MG IM SOLR
10.0000 mg | Freq: Once | INTRAMUSCULAR | Status: AC
  Administered 2023-08-30: 10 mg via INTRAMUSCULAR
  Filled 2023-08-30: qty 20

## 2023-08-30 MED ORDER — IPRATROPIUM-ALBUTEROL 0.5-2.5 (3) MG/3ML IN SOLN: 3.0000 mL | Freq: Four times a day (QID) | RESPIRATORY_TRACT | Status: DC | PRN

## 2023-08-30 MED ORDER — PANTOPRAZOLE SODIUM 40 MG PO TBEC: 40.0000 mg | DELAYED_RELEASE_TABLET | Freq: Every day | ORAL | Status: DC

## 2023-08-30 MED ORDER — ONDANSETRON HCL 4 MG PO TABS: 4.0000 mg | ORAL_TABLET | Freq: Four times a day (QID) | ORAL | Status: DC | PRN

## 2023-08-30 MED ORDER — METHYLPREDNISOLONE SODIUM SUCC 40 MG IJ SOLR
40.0000 mg | Freq: Two times a day (BID) | INTRAMUSCULAR | Status: DC
  Administered 2023-08-30 – 2023-09-02 (×6): 40 mg via INTRAVENOUS
  Filled 2023-08-30 (×6): qty 1

## 2023-08-30 MED ORDER — FESOTERODINE FUMARATE ER 4 MG PO TB24
4.0000 mg | ORAL_TABLET | Freq: Every day | ORAL | Status: DC
  Filled 2023-08-30: qty 1

## 2023-08-30 MED ORDER — ORAL CARE MOUTH RINSE
15.0000 mL | OROMUCOSAL | Status: DC
  Administered 2023-08-30 – 2023-09-02 (×11): 15 mL via OROMUCOSAL

## 2023-08-30 MED ORDER — ENOXAPARIN SODIUM 80 MG/0.8ML IJ SOSY
65.0000 mg | PREFILLED_SYRINGE | INTRAMUSCULAR | Status: DC
  Administered 2023-08-30 – 2023-09-01 (×3): 65 mg via SUBCUTANEOUS
  Filled 2023-08-30 (×2): qty 0.65
  Filled 2023-08-30: qty 0.8
  Filled 2023-08-30 (×2): qty 0.65

## 2023-08-30 MED ORDER — CHLORHEXIDINE GLUCONATE CLOTH 2 % EX PADS: 6.0000 | MEDICATED_PAD | Freq: Every day | CUTANEOUS | Status: DC

## 2023-08-30 MED ORDER — ACETAMINOPHEN 325 MG RE SUPP
650.0000 mg | Freq: Once | RECTAL | Status: AC
  Administered 2023-08-30: 650 mg via RECTAL
  Filled 2023-08-30: qty 2

## 2023-08-30 MED ORDER — PANTOPRAZOLE SODIUM 40 MG PO TBEC
40.0000 mg | DELAYED_RELEASE_TABLET | Freq: Every day | ORAL | Status: DC
  Administered 2023-08-31 – 2023-09-02 (×3): 40 mg via ORAL
  Filled 2023-08-30 (×3): qty 1

## 2023-08-30 MED ORDER — SODIUM CHLORIDE 0.9 % IV SOLN
2.0000 g | Freq: Once | INTRAVENOUS | Status: AC
  Administered 2023-08-30: 2 g via INTRAVENOUS
  Filled 2023-08-30: qty 12.5

## 2023-08-30 MED ORDER — SODIUM CHLORIDE 0.9 % IV SOLN
500.0000 mg | INTRAVENOUS | Status: DC
  Administered 2023-08-30 – 2023-09-02 (×4): 500 mg via INTRAVENOUS
  Filled 2023-08-30 (×4): qty 5

## 2023-08-30 MED ORDER — IPRATROPIUM BROMIDE 0.02 % IN SOLN: 0.5000 mg | Freq: Four times a day (QID) | RESPIRATORY_TRACT | Status: DC | PRN

## 2023-08-30 MED ORDER — SODIUM CHLORIDE 0.9 % IV BOLUS: 1000.0000 mL | Freq: Once | INTRAVENOUS | Status: DC

## 2023-08-30 MED ORDER — BUDESONIDE 0.25 MG/2ML IN SUSP
0.2500 mg | Freq: Two times a day (BID) | RESPIRATORY_TRACT | Status: DC
  Administered 2023-08-30 – 2023-09-02 (×7): 0.25 mg via RESPIRATORY_TRACT
  Filled 2023-08-30 (×7): qty 2

## 2023-08-30 MED ORDER — ACETAMINOPHEN 650 MG RE SUPP
650.0000 mg | Freq: Four times a day (QID) | RECTAL | Status: DC | PRN
  Administered 2023-08-30: 650 mg via RECTAL
  Filled 2023-08-30: qty 2

## 2023-08-30 MED ORDER — CHLORHEXIDINE GLUCONATE CLOTH 2 % EX PADS
6.0000 | MEDICATED_PAD | Freq: Every day | CUTANEOUS | Status: DC
  Administered 2023-08-30 – 2023-09-02 (×4): 6 via TOPICAL

## 2023-08-30 MED ORDER — ALBUTEROL SULFATE HFA 108 (90 BASE) MCG/ACT IN AERS: 2.0000 | INHALATION_SPRAY | Freq: Four times a day (QID) | RESPIRATORY_TRACT | 2 refills | Status: AC | PRN

## 2023-08-30 MED ORDER — ALBUTEROL SULFATE (2.5 MG/3ML) 0.083% IN NEBU: 2.5000 mg | INHALATION_SOLUTION | RESPIRATORY_TRACT | 2 refills | Status: AC | PRN

## 2023-08-30 MED ORDER — LACTATED RINGERS IV BOLUS
1000.0000 mL | Freq: Once | INTRAVENOUS | Status: AC
  Administered 2023-08-30: 1000 mL via INTRAVENOUS

## 2023-08-30 MED ORDER — MUPIROCIN 2 % EX OINT
1.0000 | TOPICAL_OINTMENT | Freq: Two times a day (BID) | CUTANEOUS | Status: DC
  Filled 2023-08-30: qty 22

## 2023-08-30 MED ORDER — ATORVASTATIN CALCIUM 20 MG PO TABS
40.0000 mg | ORAL_TABLET | Freq: Every day | ORAL | Status: DC
  Administered 2023-08-31 – 2023-09-02 (×3): 40 mg via ORAL
  Filled 2023-08-30 (×3): qty 2

## 2023-08-30 MED ORDER — VANCOMYCIN HCL IN DEXTROSE 1-5 GM/200ML-% IV SOLN
1000.0000 mg | Freq: Once | INTRAVENOUS | Status: AC
  Administered 2023-08-30: 1000 mg via INTRAVENOUS
  Filled 2023-08-30: qty 200

## 2023-08-30 MED ORDER — BUDESONIDE 0.25 MG/2ML IN SUSP: 0.2500 mg | Freq: Two times a day (BID) | RESPIRATORY_TRACT | 2 refills | Status: AC

## 2023-08-30 MED ORDER — ONDANSETRON HCL 4 MG/2ML IJ SOLN: 4.0000 mg | Freq: Four times a day (QID) | INTRAMUSCULAR | Status: DC | PRN

## 2023-08-30 MED ORDER — FESOTERODINE FUMARATE ER 4 MG PO TB24
4.0000 mg | ORAL_TABLET | Freq: Every day | ORAL | Status: DC
  Administered 2023-08-31 – 2023-09-02 (×3): 4 mg via ORAL
  Filled 2023-08-30 (×3): qty 1

## 2023-08-30 MED ORDER — IPRATROPIUM-ALBUTEROL 0.5-2.5 (3) MG/3ML IN SOLN
3.0000 mL | Freq: Four times a day (QID) | RESPIRATORY_TRACT | Status: DC
  Administered 2023-08-30 – 2023-08-31 (×5): 3 mL via RESPIRATORY_TRACT
  Filled 2023-08-30 (×5): qty 3

## 2023-08-30 NOTE — ED Notes (Signed)
Placed on 2 L [place] per Dr Derrill Kay

## 2023-08-30 NOTE — ED Provider Notes (Signed)
[facility] Provider Note    Event Date/Time   First MD Initiated Contact with Patient 08/30/23 [phone]     (approximate)   History   Altered Mental Status   HPI  Carla Huerta is a 72 y.o. female   who presents to the emergency department today because of concerns for altered mental status.  Unfortunate the patient is unable to give any history.  Per EMS report patient was last seen normal last night by family.      Physical Exam   Triage Vital Signs: ED Triage Vitals  Encounter Vitals Group     BP 08/30/23 0908 (!) 142/77     Systolic BP Percentile --      Diastolic BP Percentile --      Pulse Rate 08/30/23 0908 (!) 126     Resp --      Temp 08/30/23 0908 (!) 101.8 F (38.8 C)     Temp src --      SpO2 08/30/23 0908 94 %     Weight 08/30/23 0904 293 lb 10.4 oz (133.2 kg)     Height --      Head Circumference --      Peak Flow --      Pain Score --      Pain Loc --      Pain Education --      Exclude from Growth Chart --     Most recent vital signs: Vitals:   08/30/23 0908  BP: (!) 142/77  Pulse: (!) 126  Temp: (!) 101.8 F (38.8 C)  SpO2: 94%   General: Awake, alert, oriented to name only CV:  Good peripheral perfusion. Tachycardia. Resp:  Normal effort. Lungs clear Abd:  No distention. Non tender.  ED Results / Procedures / Treatments   Labs (all labs ordered are listed, but only abnormal results are displayed) Labs Reviewed  LACTIC ACID, PLASMA - Abnormal; Notable for the following components:      Result Value   Lactic Acid, Venous 3.9 (*)    All other components within normal limits  COMPREHENSIVE METABOLIC PANEL - Abnormal; Notable for the following components:   Sodium 132 (*)    Potassium 3.2 (*)    CO2 21 (*)    Glucose, Bld 167 (*)    BUN 24 (*)    Creatinine, Ser 1.37 (*)    AST 69 (*)    Total Bilirubin 1.9 (*)    GFR, Estimated 42 (*)    All other components within normal limits  CBC WITH  DIFFERENTIAL/PLATELET - Abnormal; Notable for the following components:   WBC 11.9 (*)    Platelets 105 (*)    Neutro Abs 10.4 (*)    Lymphs Abs 0.4 (*)    Abs Immature Granulocytes 0.08 (*)    All other components within normal limits  URINALYSIS, W/ REFLEX TO CULTURE (INFECTION SUSPECTED) - Abnormal; Notable for the following components:   Color, Urine AMBER (*)    APPearance HAZY (*)    Hgb urine dipstick LARGE (*)    Protein, ur 100 (*)    Leukocytes,Ua SMALL (*)    Bacteria, UA MANY (*)    All other components within normal limits  RESP PANEL BY RT-PCR (RSV, FLU A&B, COVID)  [id number]  CULTURE, BLOOD (ROUTINE X 2)  CULTURE, BLOOD (ROUTINE X 2)  URINE CULTURE  LACTIC ACID, PLASMA     EKG  I, Phineas Semen, attending physician, personally viewed  and interpreted this EKG  EKG Time: 0910 Rate: 124 Rhythm: sinus tachycardia Axis: left axis deviation Intervals: qtc 459 QRS: narrow ST changes: no st elevation Impression: abnormal ekg  RADIOLOGY I independently interpreted and visualized the CXR. My interpretation: Opacities in left lung Radiology interpretation:  IMPRESSION:  1. Low lung volume.  2. Diffuse increased interstitial markings with central and  bibasilar prominence, most in keeping with pulmonary edema.     PROCEDURES:  Critical Care performed: Yes  CRITICAL CARE Performed by: Phineas Semen   Total critical care time: 30 minutes  Critical care time was exclusive of separately billable procedures and treating other patients.  Critical care was necessary to treat or prevent imminent or life-threatening deterioration.  Critical care was time spent personally by me on the following activities: development of treatment plan with patient and/or surrogate as well as nursing, discussions with consultants, evaluation of patient's response to treatment, examination of patient, obtaining history from patient or surrogate, ordering and performing treatments  and interventions, ordering and review of laboratory studies, ordering and review of radiographic studies, pulse oximetry and re-evaluation of patient's condition.   Procedures    MEDICATIONS ORDERED IN ED: Medications  vancomycin (VANCOCIN) IVPB 1000 mg/200 mL premix (has no administration in time range)  ceFEPIme (MAXIPIME) 2 g in sodium chloride 0.9 % 100 mL IVPB (2 g Intravenous New Bag/Given 08/30/23 0955)  acetaminophen (TYLENOL) suppository 650 mg (650 mg Rectal Given 08/30/23 0947)  lactated ringers bolus 1,000 mL (1,000 mLs Intravenous New Bag/Given 08/30/23 1006)     IMPRESSION / MDM / ASSESSMENT AND PLAN / ED COURSE  I reviewed the triage vital signs and the nursing notes.                              Differential diagnosis includes, but is not limited to, UTI, pneumonia, COVID  Patient's presentation is most consistent with acute presentation with potential threat to life or bodily function.   The patient is on the cardiac monitor to evaluate for evidence of arrhythmia and/or significant heart rate changes.  Patient presented to the emergency department today because of concerns for altered mental status.  Initial vital signs were concerning for tachycardic and fever.  At this time I would have concerns for possible infection.  Patient was ordered IV antibiotics.  Will check blood work, UA, chest x-ray.  UA is consistent with infection. Lactic acid elevated. Do think patient's ams is likely coming from UTI. Discussed with Dr. Chipper Herb with the hospitalist service who will plan on admission.      FINAL CLINICAL IMPRESSION(S) / ED DIAGNOSES   Final diagnoses:  Lower urinary tract infectious disease  Altered mental status, unspecified altered mental status type     Note:  This document was prepared using Dragon voice recognition software and may include unintentional dictation errors.    Phineas Semen, MD 08/31/23 1005

## 2023-08-30 NOTE — ED Notes (Signed)
RT to bedside place pt BIPAP

## 2023-08-30 NOTE — ED Notes (Signed)
Attending made aware of pt increased RR/ Work of breathing. Pt reamins restless. RT consulted

## 2023-08-30 NOTE — ED Notes (Addendum)
Attending Zhang at bedside

## 2023-08-30 NOTE — ED Notes (Signed)
Pt sister at bedside °

## 2023-08-30 NOTE — Progress Notes (Signed)
Patient alert to self, on bipap, febrile, with soft pressures. Patient able to follow commands. Family at bedside, updated. Contact information updated, password placed, [place].  Son at bedside, per son, advance directive placed in chart May 2024. NP notified, Code Status updated.

## 2023-08-30 NOTE — Progress Notes (Signed)
eLink Physician-Brief Progress Note Patient Name: Carla Huerta DOB: Nov 03, 1951 MRN: [id number]   Date of Service  08/30/2023  HPI/Events of Note  72 year old female with a history of heart failure with preserved EF, cirrhosis 2/2 NASH, CKD and chronic hyponatremia who is brought to the emergency department for changes in mentation.  Found to have sepsis thought to be secondary to urinary tract infection.  Initially febrile to 104, persistently tachypneic and tachycardic.  Initially hypotensive now improved.  Saturating 96% on BiPAP with 30% FiO2  All results show normal oxygenation and ventilation on BiPAP.  Last BMP with acute electrolyte abnormalities, elevated creatinine, downtrending lactic acid.  Mild leukocytosis and thrombocytopenia.  INR of 2.1.  Urinalysis grossly positive.  Blood cultures have been obtained.  Chest radiograph unremarkable.  ECG with sinus tachycardia  eICU Interventions  Maintain antibiotics ceftriaxone and azithromycin currently ordered  Responding appropriately to fluids, could consider norepinephrine if unable to maintain MAP greater than 65  Answered family questions at bedside.  GI prophylaxis with home PPI SCDs for DVT prophylaxis, consider adding chemoprophylaxis     Intervention Category Evaluation Type: New Patient Evaluation  Carla Huerta 08/30/2023, 7:37 PM

## 2023-08-30 NOTE — ED Notes (Signed)
Dr Derrill Kay notified of lactic 3.9. orders to be placed as needed

## 2023-08-30 NOTE — Consult Note (Incomplete)
NAME:  Carla Huerta, MRN:  [id number], DOB:  Jun 18, 1951, LOS: 0 ADMISSION DATE:  08/30/2023, CONSULTATION DATE:  08/30/23 REFERRING MD: Mikey College, REASON FOR CONSULT: Hypotension   HPI  72 y.o with significant PMH of chronic HFpEF, cirrhosis/NASH, COPD, OSA on CPAP, anxiety and depression, splenic laceration, diaphragm paralysis, HTN, CKD stage II, chronic hyponatremia who presented to the ED with chief complaints of altered mental status since this morning.  Per family LKW yesterday morning.   ED Course: Initial vital signs showed HR of 126 beats/minute, BP 142/77 mm Hg, the RR 24 breaths/minute, and the oxygen saturation 93% on 2 L and a temperature of 101.8 F (38.8 C). Pertinent Labs/Diagnostics Findings: Na+/ K+: 132/3.2.  Glucose: 167.  BUN/Cr.:  24/1.37  AST/ALT: 69/25 WBC 11.9 Plts: 105 PT/INR:23.6/2.1 UA positive UTI PCT: negative <0.10  Lactic acid: 3.9 COVID PCR: Negative,  VBG: pO2 <31; pCO2 43; pH 7.39;  HCO3 26, %O2 Sat 36.5.  CXR> pulmonary edema  Patient given 30 cc/kg of fluids and started on broad-spectrum antibiotics Vanco cefepime and for sepsis secondary to  suspected UTI.  Patient admitted to [facility] service.  Past Medical History  chronic HFpEF, cirrhosis/NASH, COPD, OSA on CPAP, anxiety and depression, splenic laceration, diaphragm paralysis, HTN, CKD stage II, chronic hyponatremia  Significant Hospital Events   10/19: Admitted to [facility] with AMS found to be septic secondary to UTI.  PCCM consulted for possible pressor requirement.  Consults:  PCCM  Procedures:  None  Significant Diagnostic Tests:  08/30/2023: Chest Xray> IMPRESSION: 1. Low lung volume. 2. Diffuse increased interstitial markings with central and bibasilar prominence, most in keeping with pulmonary edema.  Interim History / Subjective:      Micro Data:  10/19: SARS-CoV-2 PCR> negative 10/19: Influenza PCR> negative 10/19: Blood culture x2> 10/19: Urine Culture> 10/19: MRSA  PCR>>   Antimicrobials:  Vancomycin 10/19 x 1 Cefepime 10/19 x 1 Azithromycin 10/19>> Ceftriaxone 10/19>>  OBJECTIVE  Blood pressure 104/65, pulse (!) 101, temperature 99.6 F (37.6 C), temperature source Axillary, resp. rate (!) 31, weight 132.7 kg, SpO2 99%.    FiO2 (%):  [35 %-40 %] 35 %  No intake or output data in the 24 hours ending 08/30/23 1911 Filed Weights   08/30/23 0904 08/30/23 1854  Weight: 133.2 kg 132.7 kg   Physical Examination  GENERAL:71  year-old critically ill patient lying in the bed  EYES: PEERLA. No scleral icterus. Extraocular muscles intact.  HEENT: Head atraumatic, normocephalic. Oropharynx and nasopharynx clear.  NECK:  No JVD, supple  LUNGS: Normal breath sounds bilaterally.  No use of accessory muscles of respiration.  CARDIOVASCULAR: S1, S2 normal. No murmurs, rubs, or gallops.  ABDOMEN: Soft, NTND EXTREMITIES: No swelling or erythema.  Capillary refill < 3 seconds in all extremities. Pulses palpable distally. NEUROLOGIC: The patient is . No focal neurological deficit appreciated. Cranial nerves are intact.  SKIN: No obvious rash, lesion, or ulcer. Warm to touch Labs/imaging that I havepersonally reviewed  (right click and "Reselect all SmartList Selections" daily)     Labs   CBC: Recent Labs  Lab 08/30/23 0920  WBC 11.9*  NEUTROABS 10.4*  HGB 14.2  HCT 42.5  MCV 93.4  PLT 105*    Basic Metabolic Panel: Recent Labs  Lab 08/30/23 0920  NA 132*  K 3.2*  CL 100  CO2 21*  GLUCOSE 167*  BUN 24*  CREATININE 1.37*  CALCIUM 9.5   GFR: Estimated Creatinine Clearance: 52.7 mL/min (A) (by C-G  formula based on SCr of 1.37 mg/dL (H)). Recent Labs  Lab 08/30/23 0920 08/30/23 1118  WBC 11.9*  --   LATICACIDVEN 3.9* 2.8*    Liver Function Tests: Recent Labs  Lab 08/30/23 0920  AST 69*  ALT 25  ALKPHOS 82  BILITOT 1.9*  PROT 7.8  ALBUMIN 3.6   No results for input(s): "LIPASE", "AMYLASE" in the last 168 hours. Recent Labs   Lab 08/30/23 1507  AMMONIA 34    ABG    Component Value Date/Time   HCO3 26.0 08/30/2023 1342   O2SAT 36.5 08/30/2023 1342     Coagulation Profile: Recent Labs  Lab 08/30/23 1257  INR 2.1*    Cardiac Enzymes: No results for input(s): "CKTOTAL", "CKMB", "CKMBINDEX", "TROPONINI" in the last 168 hours.  HbA1C: Hgb A1c MFr Bld  Date/Time Value Ref Range Status  03/13/2023 12:40 AM 5.5 4.8 - 5.6 % Final    Comment:    (NOTE) Pre diabetes:          5.7%-6.4%  Diabetes:              >6.4%  Glycemic control for   <7.0% adults with diabetes     CBG: Recent Labs  Lab 08/30/23 1850  GLUCAP 138*    Review of Systems:   Unable to be obtained secondary to the patient's altered mental status.   Past Medical History  She,  has a past medical history of Anemia, CHF (congestive heart failure) (HCC), COPD (chronic obstructive pulmonary disease) (HCC), Cough, Depression, Diaphragm paralysis, Gout, Hypertension, Hyponatremia, Insomnia, Liver disease, Nonalcoholic hepatosteatosis, PONV (postoperative nausea and vomiting), Renal disorder, Sleep apnea, Splenic laceration, and Vertigo.   Surgical History    Past Surgical History:  Procedure Laterality Date  . APPENDECTOMY     when she was 19 yearsa old  . BREAST BIOPSY Left 06/17/2019   affirm bx distortion with calcs, coil clip, ADH  . BREAST EXCISIONAL BIOPSY Left 09/23/2019   neg  . BREAST LUMPECTOMY Left 09/23/2019   ADH  . BREAST LUMPECTOMY WITH NEEDLE LOCALIZATION Left 09/23/2019   Procedure: LEFT BREAST LUMPECTOMY WITH NEEDLE LOCALIZATION;  Surgeon: Griselda Miner, MD;  Location: [facility] ORS;  Service: General;  Laterality: Left;  . COLONOSCOPY WITH PROPOFOL N/A 10/01/2016   Procedure: COLONOSCOPY WITH PROPOFOL;  Surgeon: Midge Minium, MD;  Location: [facility] ENDOSCOPY;  Service: Endoscopy;  Laterality: N/A;  . ESOPHAGOGASTRODUODENOSCOPY (EGD) WITH PROPOFOL N/A 10/01/2016   Procedure: ESOPHAGOGASTRODUODENOSCOPY (EGD) WITH  PROPOFOL;  Surgeon: Midge Minium, MD;  Location: [facility] ENDOSCOPY;  Service: Endoscopy;  Laterality: N/A;  . LIVER BIOPSY     2015  . REPLACEMENT TOTAL KNEE Right 2002  . TONSILLECTOMY AND ADENOIDECTOMY     when pt was 72 years old     Social History   reports that she has quit smoking. Her smoking use included cigarettes. She uses smokeless tobacco. She reports that she does not drink alcohol and does not use drugs.   Family History   Her family history includes Breast cancer in her paternal grandmother, sister, and another family member.   Allergies Allergies  Allergen Reactions  . Latex Rash    Local reaction where it touches her body     Home Medications  Prior to Admission medications   Medication Sig Start Date End Date Taking? Authorizing Provider  albuterol (PROVENTIL) (2.5 MG/3ML) 0.083% nebulizer solution Take 3 mLs (2.5 mg total) by nebulization every 4 (four) hours as needed for wheezing or shortness of breath.  08/30/23 08/29/24 Yes Zhang, Renae Fickle, MD  albuterol (VENTOLIN HFA) 108 (90 Base) MCG/ACT inhaler Inhale 2 puffs into the lungs every 6 (six) hours as needed for wheezing or shortness of breath. 08/30/23  Yes Mikey College T, MD  atorvastatin (LIPITOR) 40 MG tablet Take 40 mg by mouth daily.    Yes [provider]  budesonide (PULMICORT) 0.25 MG/2ML nebulizer solution Take 2 mLs (0.25 mg total) by nebulization 2 (two) times daily. 08/30/23 08/29/24 Yes Emeline General, MD  Colchicine (MITIGARE) 0.6 MG CAPS Take 0.6-1.2 mg by mouth daily as needed (acute gout flare).   Yes [provider]  imipramine (TOFRANIL) 50 MG tablet Take 100 mg by mouth at bedtime.    Yes [provider]  omeprazole (PRILOSEC) 40 MG capsule Take 40 mg by mouth daily.    Yes [provider]  spironolactone (ALDACTONE) 50 MG tablet Take 50 mg by mouth daily.   Yes [provider]  tolterodine (DETROL LA) 4 MG 24 hr capsule Take 4 mg by mouth at bedtime.   Yes  [provider]  aspirin 81 MG chewable tablet Chew 81 mg by mouth at bedtime.    [provider]  lactulose (CHRONULAC) 10 GM/15ML solution Take 20 g (30 mL) 1-3 times a day for a goal of 1-2 bowel movements a day. Patient not taking: Reported on 08/30/2023 11/26/22   Almon Hercules, MD   Scheduled Meds: . aspirin  81 mg Oral QHS  . [START ON 08/31/2023] atorvastatin  40 mg Oral Daily  . budesonide (PULMICORT) nebulizer solution  0.25 mg Nebulization BID  . [START ON 08/31/2023] Chlorhexidine Gluconate Cloth  6 each Topical Q0600  . Chlorhexidine Gluconate Cloth  6 each Topical Daily  . enoxaparin (LOVENOX) injection  65 mg Subcutaneous Q24H  . [START ON 08/31/2023] fesoterodine  4 mg Oral Daily  . imipramine  100 mg Oral QHS  . ipratropium-albuterol  3 mL Nebulization Q6H  . methylPREDNISolone (SOLU-MEDROL) injection  40 mg Intravenous Q12H  . mupirocin ointment  1 Application Nasal BID  . [START ON 08/31/2023] pantoprazole  40 mg Oral Daily   Continuous Infusions: . azithromycin 500 mg (08/30/23 1732)  . [START ON 08/31/2023] cefTRIAXone (ROCEPHIN)  IV     PRN Meds:.acetaminophen **OR** acetaminophen, haloperidol lactate, ipratropium, ondansetron **OR** ondansetron (ZOFRAN) IV  Active Hospital Problem list   See systems below  Assessment & Plan:  #Acute Hypoxic Respiratory Failure in the setting of: #Pulmonary Edema/CHF, COPD exacerbation Hx: OSA on CPAP, diaphragm paralysis, COPD -Supplemental O2 as needed to maintain O2 saturations 88 to 92% -BiPAP/CPAP, wean as tolerated -Follow intermittent Chest X-ray & ABG as needed -Bronchodilators and Pulmicort nebs -IV Solu-Medrol 40 mg daily -Antibiotics as below   #Sepsis due to E. Coli Bacteremia Source urinary #Possible Pneumonia Initial interventions/workup included: 2 L of NS/LR & Cefepime/ Vancomycin/ Azithromycin -F/u cultures, trend lactic/ PCT -Monitor WBC/ fever curve -IV antibiotics Ceftriaxone AND  Azithromycin -Gentle IVF hydration as needed -Pressors PRN for MAP goal >65 -Strict I/O's  CKD stage III #Hypokalemia #Hyponatremia Cr appears to be at baseline -Strict I/O's -Daily BMP, replace electrolytes PRN -Avoid nephrotoxic agents as able, -ensure adequate renal perfusion   #Chronic HFpEF PMHx: HTN, HLD Echo 06/2022: LVEF > 55%, mild pulmonary HTN, unable to assess diastolic fxn, mod TR -BNP>4500 -trend troponin -Echocardiogram ordered -hold outpatient lopressor due to hypotension, consider restarting as patient stabilizes -Diurese as renal function and hemodynamics tolerate  Best practice:  Diet:  NPO Pain/Anxiety/Delirium protocol (if indicated): No VAP protocol (if indicated): Not indicated DVT prophylaxis: LMWH GI prophylaxis: PPI Glucose control:  SSI Yes Central venous access:  N/A Arterial line:  N/A Foley:  Yes, and it is still needed Mobility:  bed rest  PT consulted: N/A Last date of multidisciplinary goals of care discussion [see IPAL note] Code Status:  DNR Disposition: Stepdown   = Goals of Care = Code Status Order: DNR  Primary Emergency Contact: Therriault,Jimmy Wishes to pursue full aggressive treatment and intervention options, including CPR and intubation, but goals of care will be addressed on going with family if that should become necessary.   Critical care time: 45 minutes        Webb Silversmith DNP, CCRN, FNP-C, AGACNP-BC Acute Care & Family Nurse Practitioner [facility] PCCM on call pager [phone]

## 2023-08-30 NOTE — ED Triage Notes (Signed)
Pt to ED [facility] from home for possible stroke, AMS. LKW yesterday morning per family. Pt not able to answer simple questions. Currently unable to follow commands CBG 207

## 2023-08-30 NOTE — ED Notes (Addendum)
EMS reports concerns for pt's safety and wellbeing at home; EMS to contact APS

## 2023-08-30 NOTE — H&P (Signed)
History and Physical    Carla Huerta ZDG:[id number] DOB: 11/14/1950 DOA: 08/30/2023  PCP: Hillery Aldo, MD (Confirm with patient/family/NH records and if not entered, this has to be entered at [facility] point of entry) Patient coming from: Home  I have personally briefly reviewed patient's old medical records in [facility]  Chief Complaint: Patient is confused.  HPI: Carla Huerta is a 72 y.o. female with medical history significant of chronic HFpEF, cirrhosis/NASH, COPD, HTN, CKD stage II, chronic hyponatremia, sent by family member for evaluation of worsening of mentations changes.  Symptoms started this morning, patient started to become confused in the morning and found patient talking nonsense.  Soon getting worse, unable to engage in any conversation and family decided sent her to ED. Son reported the patient was coherent yesterday and had no complaints, and family did not elicit any cough urinary complaints or diarrhea.  No fever yesterday  ED Course: Febrile 101.8, tachycardia 126, blood pressure 140/70, O2 saturation 97% on 2 L.  Tachypneic breathing rate 24.  Chest x-ray showed lung congested.  UA compatible with UTI with microscopic hematuria, WBC 11.9, creatinine 1.3, hemoglobin 14.2.  Patient was given IV bolus 1000 x 1 and vancomycin cefepime  Review of Systems: Unable to perform, patient is confused  Past Medical History:  Diagnosis Date   Anemia    CHF (congestive heart failure) (HCC)    COPD (chronic obstructive pulmonary disease) (HCC)    Cough    Depression    Diaphragm paralysis    Gout    Hypertension    Hyponatremia    Insomnia    Liver disease    Nonalcoholic hepatosteatosis    PONV (postoperative nausea and vomiting)    Renal disorder    Sleep apnea    Splenic laceration    Vertigo     Past Surgical History:  Procedure Laterality Date   APPENDECTOMY     when she was 23 yearsa old   BREAST BIOPSY Left 06/17/2019   affirm bx distortion with  calcs, coil clip, ADH   BREAST EXCISIONAL BIOPSY Left 09/23/2019   neg   BREAST LUMPECTOMY Left 09/23/2019   ADH   BREAST LUMPECTOMY WITH NEEDLE LOCALIZATION Left 09/23/2019   Procedure: LEFT BREAST LUMPECTOMY WITH NEEDLE LOCALIZATION;  Surgeon: Griselda Miner, MD;  Location: [facility] ORS;  Service: General;  Laterality: Left;   COLONOSCOPY WITH PROPOFOL N/A 10/01/2016   Procedure: COLONOSCOPY WITH PROPOFOL;  Surgeon: Midge Minium, MD;  Location: [facility] ENDOSCOPY;  Service: Endoscopy;  Laterality: N/A;   ESOPHAGOGASTRODUODENOSCOPY (EGD) WITH PROPOFOL N/A 10/01/2016   Procedure: ESOPHAGOGASTRODUODENOSCOPY (EGD) WITH PROPOFOL;  Surgeon: Midge Minium, MD;  Location: [facility] ENDOSCOPY;  Service: Endoscopy;  Laterality: N/A;   LIVER BIOPSY     2015   REPLACEMENT TOTAL KNEE Right 2002   TONSILLECTOMY AND ADENOIDECTOMY     when pt was 72 years old     reports that she has quit smoking. Her smoking use included cigarettes. She uses smokeless tobacco. She reports that she does not drink alcohol and does not use drugs.  Allergies  Allergen Reactions   Latex Rash    Local reaction where it touches her body    Family History  Problem Relation Age of Onset   Breast cancer Sister    Breast cancer Paternal Grandmother    Breast cancer Other      Prior to Admission medications   Medication Sig Start Date End Date Taking? Authorizing Provider  atorvastatin (LIPITOR)  40 MG tablet Take 40 mg by mouth daily.    Yes [provider]  Colchicine (MITIGARE) 0.6 MG CAPS Take 0.6-1.2 mg by mouth daily as needed (acute gout flare).   Yes [provider]  imipramine (TOFRANIL) 50 MG tablet Take 100 mg by mouth at bedtime.    Yes [provider]  omeprazole (PRILOSEC) 40 MG capsule Take 40 mg by mouth daily.    Yes [provider]  spironolactone (ALDACTONE) 50 MG tablet Take 50 mg by mouth daily.   Yes [provider]  tolterodine (DETROL LA) 4 MG 24 hr capsule Take 4  mg by mouth at bedtime.   Yes [provider]  aspirin 81 MG chewable tablet Chew 81 mg by mouth at bedtime.    [provider]  lactulose (CHRONULAC) 10 GM/15ML solution Take 20 g (30 mL) 1-3 times a day for a goal of 1-2 bowel movements a day. Patient not taking: Reported on 08/30/2023 11/26/22   Almon Hercules, MD    Physical Exam: Vitals:   08/30/23 0908 08/30/23 1000 08/30/23 1027 08/30/23 1115  BP: (!) 142/77 129/78  (!) 126/96  Pulse: (!) 126 (!) 114  (!) 116  Resp:  (!) 24  (!) 23  Temp: (!) 101.8 F (38.8 C)  (!) 101 F (38.3 C)   TempSrc:   Oral   SpO2: 94% 93%  97%  Weight:        Constitutional: NAD, calm, comfortable Vitals:   08/30/23 0908 08/30/23 1000 08/30/23 1027 08/30/23 1115  BP: (!) 142/77 129/78  (!) 126/96  Pulse: (!) 126 (!) 114  (!) 116  Resp:  (!) 24  (!) 23  Temp: (!) 101.8 F (38.8 C)  (!) 101 F (38.3 C)   TempSrc:   Oral   SpO2: 94% 93%  97%  Weight:       Eyes: PERRL, lids and conjunctivae normal ENMT: Mucous membranes are moist. Posterior pharynx clear of any exudate or lesions.Normal dentition.  Neck: normal, supple, no masses, no thyromegaly Respiratory: clear to auscultation bilaterally, scattered wheezing, no crackles.  Increasing respiratory effort. No accessory muscle use.  Cardiovascular: Regular rate and rhythm, no murmurs / rubs / gallops. No extremity edema. 2+ pedal pulses. No carotid bruits.  Abdomen: no tenderness, no masses palpated. No hepatosplenomegaly. Bowel sounds positive.  Musculoskeletal: no clubbing / cyanosis. No joint deformity upper and lower extremities. Good ROM, no contractures. Normal muscle tone.  Skin: no rashes, lesions, ulcers. No induration Neurologic: No facial droops, moving all limbs, following simple commands Psychiatric: Awake, mumbling, confused    Labs on Admission: I have personally reviewed following labs and imaging studies  CBC: Recent Labs  Lab 08/30/23 0920  WBC 11.9*   NEUTROABS 10.4*  HGB 14.2  HCT 42.5  MCV 93.4  PLT 105*   Basic Metabolic Panel: Recent Labs  Lab 08/30/23 0920  NA 132*  K 3.2*  CL 100  CO2 21*  GLUCOSE 167*  BUN 24*  CREATININE 1.37*  CALCIUM 9.5   GFR: Estimated Creatinine Clearance: 52.8 mL/min (A) (by C-G formula based on SCr of 1.37 mg/dL (H)). Liver Function Tests: Recent Labs  Lab 08/30/23 0920  AST 69*  ALT 25  ALKPHOS 82  BILITOT 1.9*  PROT 7.8  ALBUMIN 3.6   No results for input(s): "LIPASE", "AMYLASE" in the last 168 hours. No results for input(s): "AMMONIA" in the last 168 hours. Coagulation Profile: No results for input(s): "INR", "PROTIME" in  the last 168 hours. Cardiac Enzymes: No results for input(s): "CKTOTAL", "CKMB", "CKMBINDEX", "TROPONINI" in the last 168 hours. BNP (last 3 results) No results for input(s): "PROBNP" in the last 8760 hours. HbA1C: No results for input(s): "HGBA1C" in the last 72 hours. CBG: No results for input(s): "GLUCAP" in the last 168 hours. Lipid Profile: No results for input(s): "CHOL", "HDL", "LDLCALC", "TRIG", "CHOLHDL", "LDLDIRECT" in the last 72 hours. Thyroid Function Tests: No results for input(s): "TSH", "T4TOTAL", "FREET4", "T3FREE", "THYROIDAB" in the last 72 hours. Anemia Panel: No results for input(s): "VITAMINB12", "FOLATE", "FERRITIN", "TIBC", "IRON", "RETICCTPCT" in the last 72 hours. Urine analysis:    Component Value Date/Time   COLORURINE AMBER (A) 08/30/2023 0948   APPEARANCEUR HAZY (A) 08/30/2023 0948   APPEARANCEUR Clear 04/24/2012 2129   LABSPEC 1.017 08/30/2023 0948   LABSPEC 1.012 04/24/2012 2129   PHURINE 5.0 08/30/2023 0948   GLUCOSEU NEGATIVE 08/30/2023 0948   GLUCOSEU Negative 04/24/2012 2129   HGBUR LARGE (A) 08/30/2023 0948   BILIRUBINUR NEGATIVE 08/30/2023 0948   BILIRUBINUR Negative 04/24/2012 2129   KETONESUR NEGATIVE 08/30/2023 0948   PROTEINUR 100 (A) 08/30/2023 0948   NITRITE NEGATIVE 08/30/2023 0948   LEUKOCYTESUR  SMALL (A) 08/30/2023 0948   LEUKOCYTESUR Negative 04/24/2012 2129    Radiological Exams on Admission: DG Chest Port 1 View  Result Date: 08/30/2023 CLINICAL DATA:  Questionable sepsis - evaluate for abnormality. Altered mental status. Possible stroke. EXAM: PORTABLE CHEST 1 VIEW COMPARISON:  03/12/2023. FINDINGS: Low lung volume. There are diffuse increased interstitial markings with central and bibasilar prominence. There are probable atelectatic changes at the left lung base. No dense consolidation. No pneumothorax or large pleural effusions. Moderately enlarged cardio-mediastinal silhouette, which may be accentuated by AP technique and low lung volume. No acute osseous abnormalities. The soft tissues are within normal limits. IMPRESSION: 1. Low lung volume. 2. Diffuse increased interstitial markings with central and bibasilar prominence, most in keeping with pulmonary edema. Electronically Signed   By: Jules Schick M.D.   On: 08/30/2023 09:53    EKG: Independently reviewed.  Sinus tachycardia, no acute ST changes.  Assessment/Plan Principal Problem:   Sepsis (HCC) Active Problems:   Sepsis secondary to UTI (HCC)   AMS (altered mental status)  (please populate well all problems here in Problem List. (For example, if patient is on BP meds at home and you resume or decide to hold them, it is a problem that needs to be her. Same for CAD, COPD, HLD and so on)  Severe sepsis -With symptoms of endorgan damage of acute encephalopathy, sepsis evidenced by new onset of fever tachycardia, source of infection is UTI -Narrow down antibiotics to ceftriaxone, urine culture pending -Elevated lactic acid 2.8, will give 500 mL IV bolus, otherwise patient appears to be only mild volume depletion.  Acute metabolic encephalopathy -Treat sepsis then reevaluate. -Given there is a history of cirrhosis, will check ammonium level -Check VBG to rule out concurrent CO2 retention  Acute hypoxic respiratory  failure -Clinical impression is COPD exacerbation, will check VBG. -Abdominal x-ray today, however compared to a previous x-ray done in May 2024 which showed Bramer to the same increasing interstitial marking, implying a chronic changes.  Similar changes also on the CTA chest done last year.  Discussed the finding with son over the phone, likely patient has on diagnosed/unstaged COPD, recommend she follows up with pulmonary for formal lung function test.  Family agreed. -VBG  Acute COPD exacerbation -Breathing treatment -Pulmicort -Incentive spirometry  Hypokalemia -  IV replacement  Hyponatremia, chronic -Probably secondary to cirrhosis -Sodium level stable, no indication for intervention  Chronic HFpEF -Mild volume depletion, management as above -No indication of acute CHF decompensation, hold off Lasix  NASH/cirrhosis -Euvolemic not on diuresis -Ammonium level -Recommend family take her to hepatology to follow-up, son agreed.  CKD stage II -Volume depleted, IV bolus x 1 then reevaluate  DVT prophylaxis: Lovenox Code Status: Full code Family Communication: Son over the phone Disposition Plan: Patient is sick with urosepsis requiring IV antibiotics, expect more than 2 midnight hospital stay Consults called: None Admission status: Telemetry admission   Emeline General MD [facility] Pager [phone]  08/30/2023, 12:12 PM

## 2023-08-30 NOTE — ED Notes (Signed)
In and out performed with Faith [employer] and [employer]

## 2023-08-30 NOTE — ED Notes (Signed)
Attending Zhang MD made aware of pt BP

## 2023-08-30 NOTE — ED Notes (Signed)
Pt temp 101, pt very restless, pulling self off monitoring equipment, AMS. This RN notified Derrill Kay MD of above, awaiting orders.

## 2023-08-30 NOTE — ED Notes (Signed)
Attending notified of 101 temp.

## 2023-08-30 NOTE — ED Notes (Signed)
Pt remains extremely restless. Pulling at medical equipment and IV. This RN notified attending Chipper Herb MD

## 2023-08-30 NOTE — ED Notes (Signed)
Pt remains restless/agitated, pulling at monitoring equipment. Attending Zhang MD made aware. Temp 104, attending made aware.

## 2023-08-30 NOTE — Progress Notes (Addendum)
1835 report received from ER RN, family at bedside, patient alert to self on BIPAP IV bolus going will need an additional 500 ml to get the total 1.5 liters that were ordered, waiting on patient arrival at this time to ICU room 20.  1850 patient arrived admission not completed will pass on to night RN

## 2023-08-30 NOTE — Consult Note (Incomplete)
NAME:  Carla Huerta, MRN:  [id number], DOB:  Dec 27, 1950, LOS: 0 ADMISSION DATE:  08/30/2023, CONSULTATION DATE:  08/30/23 REFERRING MD: Mikey College, REASON FOR CONSULT: Hypotension   HPI  72 y.o with significant PMH of chronic HFpEF, cirrhosis/NASH, COPD, OSA on CPAP, anxiety and depression, splenic laceration, diaphragm paralysis, HTN, CKD stage II, chronic hyponatremia who presented to the ED with chief complaints of altered mental status since this morning.  Per family LKW yesterday morning.   ED Course: Initial vital signs showed HR of 126 beats/minute, BP 142/77 mm Hg, the RR 24 breaths/minute, and the oxygen saturation 93% on 2 L and a temperature of 101.8 F (38.8 C). Pertinent Labs/Diagnostics Findings: Na+/ K+: 132/3.2.  Glucose: 167.  BUN/Cr.:  24/1.37  AST/ALT: 69/25 WBC 11.9 Plts: 105 PT/INR:23.6/2.1 UA positive UTI PCT: negative <0.10  Lactic acid: 3.9 COVID PCR: Negative,  VBG: pO2 <31; pCO2 43; pH 7.39;  HCO3 26, %O2 Sat 36.5.  CXR> pulmonary edema  Patient given 30 cc/kg of fluids and started on broad-spectrum antibiotics Vanco cefepime and for sepsis secondary to  suspected UTI.  Patient admitted to [facility] service.  Past Medical History  chronic HFpEF, cirrhosis/NASH, COPD, OSA on CPAP, anxiety and depression, splenic laceration, diaphragm paralysis, HTN, CKD stage II, chronic hyponatremia  Significant Hospital Events   10/19: Admitted to [facility] with AMS found to be septic secondary to UTI.  PCCM consulted for possible pressor requirement.  Consults:  PCCM  Procedures:  None  Significant Diagnostic Tests:  08/30/2023: Chest Xray> IMPRESSION: 1. Low lung volume. 2. Diffuse increased interstitial markings with central and bibasilar prominence, most in keeping with pulmonary edema.  Interim History / Subjective:      Micro Data:  10/19: SARS-CoV-2 PCR> negative 10/19: Influenza PCR> negative 10/19: Blood culture x2> 10/19: Urine Culture> 10/19: MRSA  PCR>>   Antimicrobials:  Vancomycin 10/19 x 1 Cefepime 10/19 x 1 Azithromycin 10/19>> Ceftriaxone 10/19>>  OBJECTIVE  Blood pressure 104/65, pulse (!) 101, temperature 99.6 F (37.6 C), temperature source Axillary, resp. rate (!) 31, weight 132.7 kg, SpO2 99%.    FiO2 (%):  [35 %-40 %] 35 %  No intake or output data in the 24 hours ending 08/30/23 1911 Filed Weights   08/30/23 0904 08/30/23 1854  Weight: 133.2 kg 132.7 kg   Physical Examination  GENERAL:72  year-old critically ill patient lying in the bed on BiPAP EYES: PEERLA. No scleral icterus. Extraocular muscles intact.  HEENT: Head atraumatic, normocephalic. Oropharynx and nasopharynx clear.  NECK:  No JVD, supple  LUNGS: Decreased breath sounds bilaterally.  No use of accessory muscles of respiration.  CARDIOVASCULAR: S1, S2 normal. No murmurs, rubs, or gallops.  ABDOMEN: Soft, NTND EXTREMITIES: No swelling or erythema.  Capillary refill > 3 seconds in all extremities. 2+ Pulses palpable distally. NEUROLOGIC: The patient is confuse on BiPAP. No focal neurological deficit appreciated. Cranial nerves are intact.  SKIN: No obvious rash, lesion, or ulcer. Warm to touch Labs/imaging that I havepersonally reviewed  (right click and "Reselect all SmartList Selections" daily)     Labs   CBC: Recent Labs  Lab 08/30/23 0920  WBC 11.9*  NEUTROABS 10.4*  HGB 14.2  HCT 42.5  MCV 93.4  PLT 105*    Basic Metabolic Panel: Recent Labs  Lab 08/30/23 0920  NA 132*  K 3.2*  CL 100  CO2 21*  GLUCOSE 167*  BUN 24*  CREATININE 1.37*  CALCIUM 9.5   GFR: Estimated Creatinine Clearance: 52.7  mL/min (A) (by C-G formula based on SCr of 1.37 mg/dL (H)). Recent Labs  Lab 08/30/23 0920 08/30/23 1118  WBC 11.9*  --   LATICACIDVEN 3.9* 2.8*    Liver Function Tests: Recent Labs  Lab 08/30/23 0920  AST 69*  ALT 25  ALKPHOS 82  BILITOT 1.9*  PROT 7.8  ALBUMIN 3.6   No results for input(s): "LIPASE", "AMYLASE" in the  last 168 hours. Recent Labs  Lab 08/30/23 1507  AMMONIA 34    ABG    Component Value Date/Time   HCO3 26.0 08/30/2023 1342   O2SAT 36.5 08/30/2023 1342     Coagulation Profile: Recent Labs  Lab 08/30/23 1257  INR 2.1*    Cardiac Enzymes: No results for input(s): "CKTOTAL", "CKMB", "CKMBINDEX", "TROPONINI" in the last 168 hours.  HbA1C: Hgb A1c MFr Bld  Date/Time Value Ref Range Status  03/13/2023 12:40 AM 5.5 4.8 - 5.6 % Final    Comment:    (NOTE) Pre diabetes:          5.7%-6.4%  Diabetes:              >6.4%  Glycemic control for   <7.0% adults with diabetes     CBG: Recent Labs  Lab 08/30/23 1850  GLUCAP 138*    Review of Systems:   Unable to be obtained secondary to the patient's altered mental status.   Past Medical History  She,  has a past medical history of Anemia, CHF (congestive heart failure) (HCC), COPD (chronic obstructive pulmonary disease) (HCC), Cough, Depression, Diaphragm paralysis, Gout, Hypertension, Hyponatremia, Insomnia, Liver disease, Nonalcoholic hepatosteatosis, PONV (postoperative nausea and vomiting), Renal disorder, Sleep apnea, Splenic laceration, and Vertigo.   Surgical History    Past Surgical History:  Procedure Laterality Date   APPENDECTOMY     when she was 81 yearsa old   BREAST BIOPSY Left 06/17/2019   affirm bx distortion with calcs, coil clip, ADH   BREAST EXCISIONAL BIOPSY Left 09/23/2019   neg   BREAST LUMPECTOMY Left 09/23/2019   ADH   BREAST LUMPECTOMY WITH NEEDLE LOCALIZATION Left 09/23/2019   Procedure: LEFT BREAST LUMPECTOMY WITH NEEDLE LOCALIZATION;  Surgeon: Griselda Miner, MD;  Location: [facility] ORS;  Service: General;  Laterality: Left;   COLONOSCOPY WITH PROPOFOL N/A 10/01/2016   Procedure: COLONOSCOPY WITH PROPOFOL;  Surgeon: Midge Minium, MD;  Location: [facility] ENDOSCOPY;  Service: Endoscopy;  Laterality: N/A;   ESOPHAGOGASTRODUODENOSCOPY (EGD) WITH PROPOFOL N/A 10/01/2016   Procedure:  ESOPHAGOGASTRODUODENOSCOPY (EGD) WITH PROPOFOL;  Surgeon: Midge Minium, MD;  Location: [facility] ENDOSCOPY;  Service: Endoscopy;  Laterality: N/A;   LIVER BIOPSY     2015   REPLACEMENT TOTAL KNEE Right 2002   TONSILLECTOMY AND ADENOIDECTOMY     when pt was 72 years old     Social History   reports that she has quit smoking. Her smoking use included cigarettes. She uses smokeless tobacco. She reports that she does not drink alcohol and does not use drugs.   Family History   Her family history includes Breast cancer in her paternal grandmother, sister, and another family member.   Allergies Allergies  Allergen Reactions   Latex Rash    Local reaction where it touches her body     Home Medications  Prior to Admission medications   Medication Sig Start Date End Date Taking? Authorizing Provider  albuterol (PROVENTIL) (2.5 MG/3ML) 0.083% nebulizer solution Take 3 mLs (2.5 mg total) by nebulization every 4 (four) hours as needed for wheezing  or shortness of breath. 08/30/23 08/29/24 Yes Zhang, Renae Fickle, MD  albuterol (VENTOLIN HFA) 108 (90 Base) MCG/ACT inhaler Inhale 2 puffs into the lungs every 6 (six) hours as needed for wheezing or shortness of breath. 08/30/23  Yes Mikey College T, MD  atorvastatin (LIPITOR) 40 MG tablet Take 40 mg by mouth daily.    Yes [provider]  budesonide (PULMICORT) 0.25 MG/2ML nebulizer solution Take 2 mLs (0.25 mg total) by nebulization 2 (two) times daily. 08/30/23 08/29/24 Yes Emeline General, MD  Colchicine (MITIGARE) 0.6 MG CAPS Take 0.6-1.2 mg by mouth daily as needed (acute gout flare).   Yes [provider]  imipramine (TOFRANIL) 50 MG tablet Take 100 mg by mouth at bedtime.    Yes [provider]  omeprazole (PRILOSEC) 40 MG capsule Take 40 mg by mouth daily.    Yes [provider]  spironolactone (ALDACTONE) 50 MG tablet Take 50 mg by mouth daily.   Yes [provider]  tolterodine (DETROL LA) 4 MG 24 hr capsule  Take 4 mg by mouth at bedtime.   Yes [provider]  aspirin 81 MG chewable tablet Chew 81 mg by mouth at bedtime.    [provider]  lactulose (CHRONULAC) 10 GM/15ML solution Take 20 g (30 mL) 1-3 times a day for a goal of 1-2 bowel movements a day. Patient not taking: Reported on 08/30/2023 11/26/22   Almon Hercules, MD   Scheduled Meds:  aspirin  81 mg Oral QHS   [START ON 08/31/2023] atorvastatin  40 mg Oral Daily   budesonide (PULMICORT) nebulizer solution  0.25 mg Nebulization BID   [START ON 08/31/2023] Chlorhexidine Gluconate Cloth  6 each Topical Q0600   Chlorhexidine Gluconate Cloth  6 each Topical Daily   enoxaparin (LOVENOX) injection  65 mg Subcutaneous Q24H   [START ON 08/31/2023] fesoterodine  4 mg Oral Daily   imipramine  100 mg Oral QHS   ipratropium-albuterol  3 mL Nebulization Q6H   methylPREDNISolone (SOLU-MEDROL) injection  40 mg Intravenous Q12H   mupirocin ointment  1 Application Nasal BID   [START ON 08/31/2023] pantoprazole  40 mg Oral Daily   Continuous Infusions:  azithromycin 500 mg (08/30/23 1732)   [START ON 08/31/2023] cefTRIAXone (ROCEPHIN)  IV     PRN Meds:.acetaminophen **OR** acetaminophen, haloperidol lactate, ipratropium, ondansetron **OR** ondansetron (ZOFRAN) IV  Active Hospital Problem list   See systems below  Assessment & Plan:  #Acute Hypoxic Respiratory Failure in the setting of: #Pulmonary Edema/CHF, COPD exacerbation Hx: OSA on CPAP, diaphragm paralysis, COPD -Supplemental O2 as needed to maintain O2 saturations 88 to 92% -BiPAP/CPAP, wean as tolerated -Follow intermittent Chest X-ray & ABG as needed -Bronchodilators and Pulmicort nebs -IV Solu-Medrol 40 mg daily -Antibiotics as below   #Sepsis due to E. Coli Bacteremia Source urinary #Possible Pneumonia Initial interventions/workup included: 2 L of NS/LR & Cefepime/ Vancomycin/ Azithromycin -F/u cultures, trend lactic/ PCT -Monitor WBC/ fever curve -IV  antibiotics Ceftriaxone AND Azithromycin -Gentle IVF hydration as needed -Pressors PRN for MAP goal >65 -Strict I/O's  CKD stage III #Hypokalemia #Hyponatremia Cr appears to be at baseline -Strict I/O's -Daily BMP, replace electrolytes PRN -Avoid nephrotoxic agents as able, -ensure adequate renal perfusion   #Chronic HFpEF PMHx: HTN, HLD Echo 10/2022: LVEF >60-65% grade 1 diastolic dysfunction -BNP only 117 -Appears volume depleted -Echocardiogram ordered -Continue atorvastatin 40 mg and aspirin 81 mg -Hold diuretics in the setting of hypotension  #Thrombocytopenia PMHx: NASH Cirrhosis  -No  evidence of ascites -Ammonia levels 34, continue lactulose -Protonix 40 mg IV BID -Monitor for S/Sx of bleeding -Trend CBC (H&H q6h)  #Acute toxic metabolic encephalopathy Now with agitation received Haldol -No indication for Precedex -Treat underlying sepsis -Continue BiPAP as above  Best practice:  Diet:  NPO Pain/Anxiety/Delirium protocol (if indicated): No VAP protocol (if indicated): Not indicated DVT prophylaxis: LMWH GI prophylaxis: PPI Glucose control:  SSI Yes Central venous access:  N/A Arterial line:  N/A Foley:  Yes, and it is still needed Mobility:  bed rest  PT consulted: N/A Last date of multidisciplinary goals of care discussion [see IPAL note] Code Status:  DNR Disposition: Stepdown   = Goals of Care = Code Status Order: DNR  Primary Emergency Contact: Atallah,Jimmy Patient wishes to pursue ongoing treatment, but concurred that if deteriorated to pulselessness, patient would prefer a natural death as opposed to invasive measures such as CPR and intubation.     Critical care time: 45 minutes        Webb Silversmith DNP, CCRN, FNP-C, AGACNP-BC Acute Care & Family Nurse Practitioner [facility] PCCM on call pager [phone]

## 2023-08-30 NOTE — Progress Notes (Signed)
PHARMACY - PHYSICIAN COMMUNICATION CRITICAL VALUE ALERT - BLOOD CULTURE IDENTIFICATION (BCID)  Carla Huerta is an 72 y.o. female who presented to [facility] on 08/30/2023 with a chief complaint of mentation changes. Patient diagnosed with severe sepsis given acute encephalopathy, fever, and tachycardia with the suspected source being urinary. Patient was initiated on antibiotics for UTI.  Assessment:  2/4 bottles (both anaerobic) with GNR; BCID E. coli without resistance.  Suspected source: urinary.   Name of physician (or Provider) Contacted: Webb Silversmith, NP  Current antibiotics: Ceftriaxone 2 g IV q24h, Azithromycin 500 mg IV q24h  Changes to prescribed antibiotics recommended:  Patient is on recommended antibiotics - No changes needed  Results for orders placed or performed during the hospital encounter of 08/30/23  Blood Culture ID Panel (Reflexed) (Collected: 08/30/2023  9:20 AM)  Result Value Ref Range   Enterococcus faecalis NOT DETECTED NOT DETECTED   Enterococcus Faecium NOT DETECTED NOT DETECTED   Listeria monocytogenes NOT DETECTED NOT DETECTED   Staphylococcus species NOT DETECTED NOT DETECTED   Staphylococcus aureus (BCID) NOT DETECTED NOT DETECTED   Staphylococcus epidermidis NOT DETECTED NOT DETECTED   Staphylococcus lugdunensis NOT DETECTED NOT DETECTED   Streptococcus species NOT DETECTED NOT DETECTED   Streptococcus agalactiae NOT DETECTED NOT DETECTED   Streptococcus pneumoniae NOT DETECTED NOT DETECTED   Streptococcus pyogenes NOT DETECTED NOT DETECTED   A.calcoaceticus-baumannii NOT DETECTED NOT DETECTED   Bacteroides fragilis NOT DETECTED NOT DETECTED   Enterobacterales DETECTED (A) NOT DETECTED   Enterobacter cloacae complex NOT DETECTED NOT DETECTED   Escherichia coli DETECTED (A) NOT DETECTED   Klebsiella aerogenes NOT DETECTED NOT DETECTED   Klebsiella oxytoca NOT DETECTED NOT DETECTED   Klebsiella pneumoniae NOT DETECTED NOT DETECTED   Proteus  species NOT DETECTED NOT DETECTED   Salmonella species NOT DETECTED NOT DETECTED   Serratia marcescens NOT DETECTED NOT DETECTED   Haemophilus influenzae NOT DETECTED NOT DETECTED   Neisseria meningitidis NOT DETECTED NOT DETECTED   Pseudomonas aeruginosa NOT DETECTED NOT DETECTED   Stenotrophomonas maltophilia NOT DETECTED NOT DETECTED   Candida albicans NOT DETECTED NOT DETECTED   Candida auris NOT DETECTED NOT DETECTED   Candida glabrata NOT DETECTED NOT DETECTED   Candida krusei NOT DETECTED NOT DETECTED   Candida parapsilosis NOT DETECTED NOT DETECTED   Candida tropicalis NOT DETECTED NOT DETECTED   Cryptococcus neoformans/gattii NOT DETECTED NOT DETECTED   CTX-M ESBL NOT DETECTED NOT DETECTED   Carbapenem resistance IMP NOT DETECTED NOT DETECTED   Carbapenem resistance KPC NOT DETECTED NOT DETECTED   Carbapenem resistance NDM NOT DETECTED NOT DETECTED   Carbapenem resist OXA 48 LIKE NOT DETECTED NOT DETECTED   Carbapenem resistance VIM NOT DETECTED NOT DETECTED    Merryl Hacker, PharmD Clinical Pharmacist 08/30/2023  9:18 PM

## 2023-08-30 NOTE — ED Notes (Signed)
This RN and NT to change pt

## 2023-08-30 NOTE — Progress Notes (Signed)
BP low, and remains tachycardic and tachypnic, will transfer patient to stepdown unit. D/C ICU attending Dr. Belia Heman.

## 2023-08-30 NOTE — ED Notes (Signed)
Pt with soiled brief on. Changed, peri care provided. Pt had BM, changed, peri care provided. Still unable to follow commands.

## 2023-08-31 DIAGNOSIS — R6520 Severe sepsis without septic shock: Secondary | ICD-10-CM | POA: Diagnosis not present

## 2023-08-31 DIAGNOSIS — N390 Urinary tract infection, site not specified: Secondary | ICD-10-CM | POA: Diagnosis not present

## 2023-08-31 DIAGNOSIS — G9341 Metabolic encephalopathy: Secondary | ICD-10-CM | POA: Diagnosis not present

## 2023-08-31 DIAGNOSIS — R400 Somnolence: Secondary | ICD-10-CM | POA: Diagnosis not present

## 2023-08-31 DIAGNOSIS — B9620 Unspecified Escherichia coli [E. coli] as the cause of diseases classified elsewhere: Secondary | ICD-10-CM | POA: Diagnosis not present

## 2023-08-31 DIAGNOSIS — A419 Sepsis, unspecified organism: Secondary | ICD-10-CM

## 2023-08-31 LAB — CBC
HCT: 37.9 % (ref 36.0–46.0)
Hemoglobin: 12.6 g/dL (ref 12.0–15.0)
MCH: 31.3 pg (ref 26.0–34.0)
MCHC: 33.2 g/dL (ref 30.0–36.0)
MCV: 94 fL (ref 80.0–100.0)
Platelets: 89 10*3/uL — ABNORMAL LOW (ref 150–400)
RBC: 4.03 MIL/uL (ref 3.87–5.11)
RDW: 15.2 % (ref 11.5–15.5)
WBC: 9.7 10*3/uL (ref 4.0–10.5)
nRBC: 0 % (ref 0.0–0.2)

## 2023-08-31 LAB — BASIC METABOLIC PANEL
Anion gap: 10 (ref 5–15)
BUN: 34 mg/dL — ABNORMAL HIGH (ref 8–23)
CO2: 20 mmol/L — ABNORMAL LOW (ref 22–32)
Calcium: 8.3 mg/dL — ABNORMAL LOW (ref 8.9–10.3)
Chloride: 104 mmol/L (ref 98–111)
Creatinine, Ser: 1.32 mg/dL — ABNORMAL HIGH (ref 0.44–1.00)
GFR, Estimated: 43 mL/min — ABNORMAL LOW (ref >60)
Glucose, Bld: 164 mg/dL — ABNORMAL HIGH (ref 70–99)
Potassium: 3.3 mmol/L — ABNORMAL LOW (ref 3.5–5.1)
Sodium: 134 mmol/L — ABNORMAL LOW (ref 135–145)

## 2023-08-31 LAB — CORTISOL: Cortisol, Plasma: 27.3 ug/dL

## 2023-08-31 LAB — GLUCOSE, CAPILLARY: Glucose-Capillary: 119 mg/dL — ABNORMAL HIGH (ref 70–99)

## 2023-08-31 MED ORDER — SODIUM CHLORIDE 0.9 % IV SOLN: INTRAVENOUS | Status: DC

## 2023-08-31 MED ORDER — POTASSIUM CHLORIDE CRYS ER 20 MEQ PO TBCR
20.0000 meq | EXTENDED_RELEASE_TABLET | Freq: Once | ORAL | Status: AC
  Administered 2023-08-31: 20 meq via ORAL
  Filled 2023-08-31: qty 1

## 2023-08-31 MED ORDER — LACTULOSE 10 GM/15ML PO SOLN: 20.0000 g | Freq: Two times a day (BID) | ORAL | Status: DC | PRN

## 2023-08-31 MED ORDER — IPRATROPIUM-ALBUTEROL 0.5-2.5 (3) MG/3ML IN SOLN
3.0000 mL | Freq: Three times a day (TID) | RESPIRATORY_TRACT | Status: DC
  Administered 2023-08-31 – 2023-09-01 (×2): 3 mL via RESPIRATORY_TRACT
  Filled 2023-08-31 (×2): qty 3

## 2023-08-31 NOTE — Progress Notes (Signed)
NAME:  Carla Huerta, MRN:  [id number], DOB:  05/10/1951, LOS: 1 ADMISSION DATE:  08/30/2023, CONSULTATION DATE:  08/30/23 REFERRING MD: Mikey College, REASON FOR CONSULT: Hypotension   HPI  72 y.o with significant PMH of chronic HFpEF, cirrhosis/NASH, COPD, OSA on CPAP, anxiety and depression, splenic laceration, diaphragm paralysis, HTN, CKD stage II, chronic hyponatremia who presented to the ED with chief complaints of altered mental status since this morning.  Per family LKW yesterday morning.   ED Course: Initial vital signs showed HR of 126 beats/minute, BP 142/77 mm Hg, the RR 24 breaths/minute, and the oxygen saturation 93% on 2 L and a temperature of 101.8 F (38.8 C). Pertinent Labs/Diagnostics Findings: Na+/ K+: 132/3.2.  Glucose: 167.  BUN/Cr.:  24/1.37  AST/ALT: 69/25 WBC 11.9 Plts: 105 PT/INR:23.6/2.1 UA positive UTI PCT: negative <0.10  Lactic acid: 3.9 COVID PCR: Negative,  VBG: pO2 <31; pCO2 43; pH 7.39;  HCO3 26, %O2 Sat 36.5.  CXR> pulmonary edema  Patient given 30 cc/kg of fluids and started on broad-spectrum antibiotics Vanco cefepime and for sepsis secondary to  suspected UTI.  Patient admitted to [facility] service.  Past Medical History  chronic HFpEF, cirrhosis/NASH, COPD, OSA on CPAP, anxiety and depression, splenic laceration, diaphragm paralysis, HTN, CKD stage II, chronic hyponatremia  Significant Hospital Events   10/19: Admitted to [facility] with AMS found to be septic secondary to UTI.  PCCM consulted for possible pressor requirement. 10/20: Remains on BiPAP, not on pressors  Consults:  PCCM  Procedures:  None  Significant Diagnostic Tests:  08/30/2023: Chest Xray> IMPRESSION: 1. Low lung volume. 2. Diffuse increased interstitial markings with central and bibasilar prominence, most in keeping with pulmonary edema.  Interim History / Subjective:      Micro Data:  10/19: SARS-CoV-2 PCR> negative 10/19: Influenza PCR> negative 10/19: Blood culture  x2> 10/19: Urine Culture> 10/19: MRSA PCR>>   Antimicrobials:  Vancomycin 10/19 x 1 Cefepime 10/19 x 1 Azithromycin 10/19>> Ceftriaxone 10/19>>  OBJECTIVE  Blood pressure (!) 101/54, pulse 87, temperature 97.6 F (36.4 C), temperature source Axillary, resp. rate 14, weight 132.7 kg, SpO2 96%.    FiO2 (%):  [35 %-40 %] 35 %   Intake/Output Summary (Last 24 hours) at 08/31/2023 0542 Last data filed at 08/31/2023 0354 Gross per 24 hour  Intake 872.78 ml  Output 300 ml  Net 572.78 ml   Filed Weights   08/30/23 0904 08/30/23 1854  Weight: 133.2 kg 132.7 kg   Physical Examination  GENERAL:72  year-old critically ill patient lying in the bed on BiPAP EYES: PEERLA. No scleral icterus. Extraocular muscles intact.  HEENT: Head atraumatic, normocephalic. Oropharynx and nasopharynx clear.  NECK:  No JVD, supple  LUNGS: Decreased breath sounds bilaterally.  No use of accessory muscles of respiration.  CARDIOVASCULAR: S1, S2 normal. No murmurs, rubs, or gallops.  ABDOMEN: Soft, NTND EXTREMITIES: No swelling or erythema.  Capillary refill > 3 seconds in all extremities. 2+ Pulses palpable distally. NEUROLOGIC: The patient is confuse on BiPAP. No focal neurological deficit appreciated. Cranial nerves are intact.  SKIN: No obvious rash, lesion, or ulcer. Warm to touch Labs/imaging that I havepersonally reviewed  (right click and "Reselect all SmartList Selections" daily)     Labs   CBC: Recent Labs  Lab 08/30/23 0920  WBC 11.9*  NEUTROABS 10.4*  HGB 14.2  HCT 42.5  MCV 93.4  PLT 105*    Basic Metabolic Panel: Recent Labs  Lab 08/30/23 0920  NA 132*  K 3.2*  CL 100  CO2 21*  GLUCOSE 167*  BUN 24*  CREATININE 1.37*  CALCIUM 9.5   GFR: Estimated Creatinine Clearance: 52.7 mL/min (A) (by C-G formula based on SCr of 1.37 mg/dL (H)). Recent Labs  Lab 08/30/23 0920 08/30/23 1118  PROCALCITON 0.65  --   WBC 11.9*  --   LATICACIDVEN 3.9* 2.8*    Liver Function  Tests: Recent Labs  Lab 08/30/23 0920  AST 69*  ALT 25  ALKPHOS 82  BILITOT 1.9*  PROT 7.8  ALBUMIN 3.6   No results for input(s): "LIPASE", "AMYLASE" in the last 168 hours. Recent Labs  Lab 08/30/23 1507  AMMONIA 34    ABG    Component Value Date/Time   PHART 7.38 08/30/2023 1932   PCO2ART 34 08/30/2023 1932   PO2ART 88 08/30/2023 1932   HCO3 20.1 08/30/2023 1932   ACIDBASEDEF 4.2 (H) 08/30/2023 1932   O2SAT 98.6 08/30/2023 1932     Coagulation Profile: Recent Labs  Lab 08/30/23 1257  INR 2.1*    Cardiac Enzymes: No results for input(s): "CKTOTAL", "CKMB", "CKMBINDEX", "TROPONINI" in the last 168 hours.  HbA1C: Hgb A1c MFr Bld  Date/Time Value Ref Range Status  03/13/2023 12:40 AM 5.5 4.8 - 5.6 % Final    Comment:    (NOTE) Pre diabetes:          5.7%-6.4%  Diabetes:              >6.4%  Glycemic control for   <7.0% adults with diabetes     CBG: Recent Labs  Lab 08/30/23 1850  GLUCAP 138*    Review of Systems:   Unable to be obtained secondary to the patient's altered mental status.   Past Medical History  She,  has a past medical history of Anemia, CHF (congestive heart failure) (HCC), COPD (chronic obstructive pulmonary disease) (HCC), Cough, Depression, Diaphragm paralysis, Gout, Hypertension, Hyponatremia, Insomnia, Liver disease, Nonalcoholic hepatosteatosis, PONV (postoperative nausea and vomiting), Renal disorder, Sleep apnea, Splenic laceration, and Vertigo.   Surgical History    Past Surgical History:  Procedure Laterality Date   APPENDECTOMY     when she was 33 yearsa old   BREAST BIOPSY Left 06/17/2019   affirm bx distortion with calcs, coil clip, ADH   BREAST EXCISIONAL BIOPSY Left 09/23/2019   neg   BREAST LUMPECTOMY Left 09/23/2019   ADH   BREAST LUMPECTOMY WITH NEEDLE LOCALIZATION Left 09/23/2019   Procedure: LEFT BREAST LUMPECTOMY WITH NEEDLE LOCALIZATION;  Surgeon: Griselda Miner, MD;  Location: [facility] ORS;  Service:  General;  Laterality: Left;   COLONOSCOPY WITH PROPOFOL N/A 10/01/2016   Procedure: COLONOSCOPY WITH PROPOFOL;  Surgeon: Midge Minium, MD;  Location: [facility] ENDOSCOPY;  Service: Endoscopy;  Laterality: N/A;   ESOPHAGOGASTRODUODENOSCOPY (EGD) WITH PROPOFOL N/A 10/01/2016   Procedure: ESOPHAGOGASTRODUODENOSCOPY (EGD) WITH PROPOFOL;  Surgeon: Midge Minium, MD;  Location: [facility] ENDOSCOPY;  Service: Endoscopy;  Laterality: N/A;   LIVER BIOPSY     2015   REPLACEMENT TOTAL KNEE Right 2002   TONSILLECTOMY AND ADENOIDECTOMY     when pt was 72 years old     Social History   reports that she has quit smoking. Her smoking use included cigarettes. She uses smokeless tobacco. She reports that she does not drink alcohol and does not use drugs.   Family History   Her family history includes Breast cancer in her paternal grandmother, sister, and another family member.   Allergies Allergies  Allergen Reactions  Latex Rash    Local reaction where it touches her body     Home Medications  Prior to Admission medications   Medication Sig Start Date End Date Taking? Authorizing Provider  albuterol (PROVENTIL) (2.5 MG/3ML) 0.083% nebulizer solution Take 3 mLs (2.5 mg total) by nebulization every 4 (four) hours as needed for wheezing or shortness of breath. 08/30/23 08/29/24 Yes Zhang, Renae Fickle, MD  albuterol (VENTOLIN HFA) 108 (90 Base) MCG/ACT inhaler Inhale 2 puffs into the lungs every 6 (six) hours as needed for wheezing or shortness of breath. 08/30/23  Yes Mikey College T, MD  atorvastatin (LIPITOR) 40 MG tablet Take 40 mg by mouth daily.    Yes [provider]  budesonide (PULMICORT) 0.25 MG/2ML nebulizer solution Take 2 mLs (0.25 mg total) by nebulization 2 (two) times daily. 08/30/23 08/29/24 Yes Emeline General, MD  Colchicine (MITIGARE) 0.6 MG CAPS Take 0.6-1.2 mg by mouth daily as needed (acute gout flare).   Yes [provider]  imipramine (TOFRANIL) 50 MG tablet Take 100 mg by mouth at  bedtime.    Yes [provider]  omeprazole (PRILOSEC) 40 MG capsule Take 40 mg by mouth daily.    Yes [provider]  spironolactone (ALDACTONE) 50 MG tablet Take 50 mg by mouth daily.   Yes [provider]  tolterodine (DETROL LA) 4 MG 24 hr capsule Take 4 mg by mouth at bedtime.   Yes [provider]  aspirin 81 MG chewable tablet Chew 81 mg by mouth at bedtime.    [provider]  lactulose (CHRONULAC) 10 GM/15ML solution Take 20 g (30 mL) 1-3 times a day for a goal of 1-2 bowel movements a day. Patient not taking: Reported on 08/30/2023 11/26/22   Almon Hercules, MD   Scheduled Meds:  aspirin  81 mg Oral QHS   atorvastatin  40 mg Oral Daily   budesonide (PULMICORT) nebulizer solution  0.25 mg Nebulization BID   Chlorhexidine Gluconate Cloth  6 each Topical Q0600   Chlorhexidine Gluconate Cloth  6 each Topical Daily   enoxaparin (LOVENOX) injection  65 mg Subcutaneous Q24H   fesoterodine  4 mg Oral Daily   imipramine  100 mg Oral QHS   ipratropium-albuterol  3 mL Nebulization Q6H   methylPREDNISolone (SOLU-MEDROL) injection  40 mg Intravenous Q12H   mouth rinse  15 mL Mouth Rinse 4 times per day   pantoprazole  40 mg Oral Daily   Continuous Infusions:  sodium chloride 100 mL/hr at 08/31/23 0352   azithromycin Stopped (08/30/23 1900)   cefTRIAXone (ROCEPHIN)  IV     PRN Meds:.acetaminophen **OR** acetaminophen, haloperidol lactate, ipratropium, ondansetron **OR** ondansetron (ZOFRAN) IV, mouth rinse  Active Hospital Problem list   See systems below  Assessment & Plan:  #Acute Hypoxic Respiratory Failure in the setting of: #Pulmonary Edema/CHF, COPD exacerbation Hx: OSA on CPAP, diaphragm paralysis, COPD -Supplemental O2 as needed to maintain O2 saturations 88 to 92% -BiPAP/CPAP, wean as tolerated -Follow intermittent Chest X-ray & ABG as needed -Bronchodilators and Pulmicort nebs -IV Solu-Medrol 40 mg daily -Antibiotics as below    #Sepsis due to E. Coli Bacteremia Source urinary #Possible Pneumonia Initial interventions/workup included: 2 L of NS/LR & Cefepime/ Vancomycin/ Azithromycin -F/u cultures, trend lactic/ PCT -Monitor WBC/ fever curve -IV antibiotics Ceftriaxone AND Azithromycin -Gentle IVF hydration as needed -Pressors PRN for MAP goal >65, has not required pressor. -Strict I/O's  CKD stage III #Hypokalemia~ #Hyponatremia~ Cr appears to be at baseline -  Strict I/O's -Daily BMP, replace electrolytes PRN -Avoid nephrotoxic agents as able, -ensure adequate renal perfusion   #Chronic HFpEF PMHx: HTN, HLD Echo 10/2022: LVEF >60-65% grade 1 diastolic dysfunction -BNP only 117 -Appears volume depleted -Echocardiogram ordered -Continue atorvastatin 40 mg and aspirin 81 mg -Hold diuretics in the setting of hypotension  #Thrombocytopenia PMHx: NASH Cirrhosis  -No evidence of ascites -Ammonia levels 34, continue lactulose -Protonix 40 mg IV BID -Monitor for S/Sx of bleeding -Trend CBC (H&H q6h)  #Acute toxic metabolic encephalopathy Now with agitation received Haldol -No indication for Precedex -Treat underlying sepsis -Continue BiPAP as above  Best practice:  Diet:  NPO Pain/Anxiety/Delirium protocol (if indicated): No VAP protocol (if indicated): Not indicated DVT prophylaxis: LMWH GI prophylaxis: PPI Glucose control:  SSI Yes Central venous access:  N/A Arterial line:  N/A Foley:  Yes, and it is still needed Mobility:  bed rest  PT consulted: N/A Last date of multidisciplinary goals of care discussion [see IPAL note] Code Status:  DNR Disposition: Stepdown   = Goals of Care = Code Status Order: DNR  Primary Emergency Contact: Rietz,Jimmy Patient wishes to pursue ongoing treatment, but concurred that if deteriorated to pulselessness, patient would prefer a natural death as opposed to invasive measures such as CPR and intubation.     Critical care time: 35 minutes         Webb Silversmith DNP, CCRN, FNP-C, AGACNP-BC Acute Care & Family Nurse Practitioner  Pulmonary & Critical Care Medicine PCCM on call pager [phone]

## 2023-08-31 NOTE — Progress Notes (Signed)
PROGRESS NOTE    Carla Huerta  PPI:[id number] DOB: 01-19-1951 DOA: 08/30/2023 PCP: Hillery Aldo, MD   Assessment & Plan:   Principal Problem:   Sepsis [facility]) Active Problems:   Sepsis secondary to UTI (HCC)   AMS (altered mental status)  Assessment and Plan:  Acute hypoxic respiratory failure: likely secondary to pulmonary edema, diastolic CHF & COPD exacerbation. 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 Continue on IV steroids, bronchodilators, IV rocephin, azithromycin. Weaned off of supplemental oxygen today   Sepsis: see Dr. Clovis Fredrickson notes on how pt met sepsis criteria. Likely secondary to e.coli bacteremia from urinary source & possibly pneumonia. Continue on IV rocephin & azithromycin.   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 Continue on lactulose prn. Continue on PPI. 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 Avoid nephrotoxic meds  Hyponatremia: almost WNL  Hypokalemia: potassium given  HTN: holding home dose of aldactone   HLD: continue on statin   Thrombocytopenia: likely secondary to NASH cirrhosis. Will continue to monitor    DVT prophylaxis: lovenox  Code Status: full  Family Communication: discussed pt's care w/ pt's son, Chanetta Marshall, and answered his questions  Disposition Plan: likely d/c back home w/ HH   Level of care: Stepdown Status is: Inpatient Remains inpatient appropriate because: severity of illness     Consultants:  ICU   Procedures:   Antimicrobials: azithromycin, rocephin   Subjective: Pt c/o malaise   Objective: Vitals:   08/31/23 0600 08/31/23 0741 08/31/23 0800 08/31/23 0819  BP: (!) 105/57  119/70   Pulse: 82 80 81 80  Resp: 18 13 14 18   Temp:      TempSrc:      SpO2: 95% 96% 94% 92%  Weight:        Intake/Output Summary (Last 24 hours) at 08/31/2023 0851 Last data filed at 08/31/2023 0817 Gross per 24 hour  Intake 1313.55 ml  Output 300 ml  Net 1013.55 ml   Filed Weights   08/30/23 0904 08/30/23 1854   Weight: 133.2 kg 132.7 kg    Examination:  General exam: Appears calm and comfortable  Respiratory system: decreased breath sounds b/l  Cardiovascular system: S1 & S2 +. No rubs, gallops or clicks.  Gastrointestinal system: Abdomen is obese, soft and nontender.  Normal bowel sounds heard. Central nervous system: Alert and awake. Moves all extremities Psychiatry: Judgement and insight appears not at baseline. Flat mood and affect    Data Reviewed: I have personally reviewed following labs and imaging studies  CBC: Recent Labs  Lab 08/30/23 0920 08/31/23 0630  WBC 11.9* 9.7  NEUTROABS 10.4*  --   HGB 14.2 12.6  HCT 42.5 37.9  MCV 93.4 94.0  PLT 105* 89*   Basic Metabolic Panel: Recent Labs  Lab 08/30/23 0920 08/31/23 0630  NA 132* 134*  K 3.2* 3.3*  CL 100 104  CO2 21* 20*  GLUCOSE 167* 164*  BUN 24* 34*  CREATININE 1.37* 1.32*  CALCIUM 9.5 8.3*   GFR: Estimated Creatinine Clearance: 54.7 mL/min (A) (by C-G formula based on SCr of 1.32 mg/dL (H)). Liver Function Tests: Recent Labs  Lab 08/30/23 0920  AST 69*  ALT 25  ALKPHOS 82  BILITOT 1.9*  PROT 7.8  ALBUMIN 3.6   No results for input(s): "LIPASE", "AMYLASE" in the last 168 hours. Recent Labs  Lab 08/30/23 1507  AMMONIA 34   Coagulation Profile: Recent Labs  Lab 08/30/23 1257  INR 2.1*   Cardiac Enzymes: No  results for input(s): "CKTOTAL", "CKMB", "CKMBINDEX", "TROPONINI" in the last 168 hours. BNP (last 3 results) No results for input(s): "PROBNP" in the last 8760 hours. HbA1C: No results for input(s): "HGBA1C" in the last 72 hours. CBG: Recent Labs  Lab 08/30/23 1850  GLUCAP 138*   Lipid Profile: No results for input(s): "CHOL", "HDL", "LDLCALC", "TRIG", "CHOLHDL", "LDLDIRECT" in the last 72 hours. Thyroid Function Tests: Recent Labs    08/30/23 1507  TSH 0.631   Anemia Panel: No results for input(s): "VITAMINB12", "FOLATE", "FERRITIN", "TIBC", "IRON", "RETICCTPCT" in the  last 72 hours. Sepsis Labs: Recent Labs  Lab 08/30/23 0920 08/30/23 1118  PROCALCITON 0.65  --   LATICACIDVEN 3.9* 2.8*    Recent Results (from the past 240 hour(s))  Resp panel by RT-PCR (RSV, Flu A&B, Covid) Anterior Nasal Swab     Status: None   Collection Time: 08/30/23  9:19 AM   Specimen: Anterior Nasal Swab  Result Value Ref Range Status   SARS Coronavirus 2 by RT PCR NEGATIVE NEGATIVE Final    Comment: (NOTE) SARS-CoV-2 target nucleic acids are NOT DETECTED.  The SARS-CoV-2 RNA is generally detectable in upper respiratory specimens during the acute phase of infection. The lowest concentration of SARS-CoV-2 viral copies this assay can detect is 138 copies/mL. A negative result does not preclude SARS-Cov-2 infection and should not be used as the sole basis for treatment or other patient management decisions. A negative result may occur with  improper specimen collection/handling, submission of specimen other than nasopharyngeal swab, presence of viral mutation(s) within the areas targeted by this assay, and inadequate number of viral copies(<138 copies/mL). A negative result must be combined with clinical observations, patient history, and epidemiological information. The expected result is Negative.  Fact Sheet for Patients:  BloggerCourse.com  Fact Sheet for Healthcare Providers:  SeriousBroker.it  This test is no t yet approved or cleared by the [place] FDA and  has been authorized for detection and/or diagnosis of SARS-CoV-2 by FDA under an Emergency Use Authorization (EUA). This EUA will remain  in effect (meaning this test can be used) for the duration of the COVID-19 declaration under Section 564(b)(1) of the Act, 21 U.S.C.section 360bbb-3(b)(1), unless the authorization is terminated  or revoked sooner.       Influenza A by PCR NEGATIVE NEGATIVE Final   Influenza B by PCR NEGATIVE NEGATIVE Final     Comment: (NOTE) The Xpert Xpress SARS-CoV-2/FLU/RSV plus assay is intended as an aid in the diagnosis of influenza from Nasopharyngeal swab specimens and should not be used as a sole basis for treatment. Nasal washings and aspirates are unacceptable for Xpert Xpress SARS-CoV-2/FLU/RSV testing.  Fact Sheet for Patients: BloggerCourse.com  Fact Sheet for Healthcare Providers: SeriousBroker.it  This test is not yet approved or cleared by the [place] FDA and has been authorized for detection and/or diagnosis of SARS-CoV-2 by FDA under an Emergency Use Authorization (EUA). This EUA will remain in effect (meaning this test can be used) for the duration of the COVID-19 declaration under Section 564(b)(1) of the Act, 21 U.S.C. section 360bbb-3(b)(1), unless the authorization is terminated or revoked.     Resp Syncytial Virus by PCR NEGATIVE NEGATIVE Final    Comment: (NOTE) Fact Sheet for Patients: BloggerCourse.com  Fact Sheet for Healthcare Providers: SeriousBroker.it  This test is not yet approved or cleared by the [place] FDA and has been authorized for detection and/or diagnosis of SARS-CoV-2 by FDA under an Emergency Use Authorization (EUA). This EUA  will remain in effect (meaning this test can be used) for the duration of the COVID-19 declaration under Section 564(b)(1) of the Act, 21 U.S.C. section 360bbb-3(b)(1), unless the authorization is terminated or revoked.  Performed at [facility], [address] [place], [place] [address]   Blood Culture (routine x 2)     Status: None (Preliminary result)   Collection Time: 08/30/23  9:20 AM   Specimen: BLOOD  Result Value Ref Range Status   Specimen Description BLOOD BLOOD RIGHT ARM  Final   Special Requests   Final    BOTTLES DRAWN AEROBIC AND ANAEROBIC Blood Culture adequate volume   Culture  Setup Time    Final    Organism ID to follow GRAM NEGATIVE RODS ANAEROBIC BOTTLE ONLY CRITICAL RESULT CALLED TO, READ BACK BY AND VERIFIED WITH:  MADISON HUNT 08/30/2023 2109 CP Performed at [facility], [address] [place], [place] [address]    Culture GRAM NEGATIVE RODS  Final   Report Status PENDING  Incomplete  Blood Culture (routine x 2)     Status: None (Preliminary result)   Collection Time: 08/30/23  9:20 AM   Specimen: BLOOD  Result Value Ref Range Status   Specimen Description BLOOD BLOOD LEFT ARM  Final   Special Requests   Final    BOTTLES DRAWN AEROBIC AND ANAEROBIC Blood Culture adequate volume   Culture  Setup Time   Final    GRAM NEGATIVE RODS IN BOTH AEROBIC AND ANAEROBIC BOTTLES CRITICAL RESULT CALLED TO, READ BACK BY AND VERIFIED WITH:  MADISON HUNT 08/30/2023 2109 CP Performed at [facility], [address] [place], [place] [address]    Culture GRAM NEGATIVE RODS  Final   Report Status PENDING  Incomplete  Blood Culture ID Panel (Reflexed)     Status: Abnormal   Collection Time: 08/30/23  9:20 AM  Result Value Ref Range Status   Enterococcus faecalis NOT DETECTED NOT DETECTED Final   Enterococcus Faecium NOT DETECTED NOT DETECTED Final   Listeria monocytogenes NOT DETECTED NOT DETECTED Final   Staphylococcus species NOT DETECTED NOT DETECTED Final   Staphylococcus aureus (BCID) NOT DETECTED NOT DETECTED Final   Staphylococcus epidermidis NOT DETECTED NOT DETECTED Final   Staphylococcus lugdunensis NOT DETECTED NOT DETECTED Final   Streptococcus species NOT DETECTED NOT DETECTED Final   Streptococcus agalactiae NOT DETECTED NOT DETECTED Final   Streptococcus pneumoniae NOT DETECTED NOT DETECTED Final   Streptococcus pyogenes NOT DETECTED NOT DETECTED Final   A.calcoaceticus-baumannii NOT DETECTED NOT DETECTED Final   Bacteroides fragilis NOT DETECTED NOT DETECTED Final   Enterobacterales DETECTED (A) NOT DETECTED Final    Comment:  Enterobacterales represent a large order of gram negative bacteria, not a single organism. CRITICAL RESULT CALLED TO, READ BACK BY AND VERIFIED WITH:  MADISON HUNT 08/30/2023 2109 CP    Enterobacter cloacae complex NOT DETECTED NOT DETECTED Final   Escherichia coli DETECTED (A) NOT DETECTED Final    Comment: CRITICAL RESULT CALLED TO, READ BACK BY AND VERIFIED WITH:  MADISON HUNT 08/30/2023 2109 CP    Klebsiella aerogenes NOT DETECTED NOT DETECTED Final   Klebsiella oxytoca NOT DETECTED NOT DETECTED Final   Klebsiella pneumoniae NOT DETECTED NOT DETECTED Final   Proteus species NOT DETECTED NOT DETECTED Final   Salmonella species NOT DETECTED NOT DETECTED Final   Serratia marcescens NOT DETECTED NOT DETECTED Final   Haemophilus influenzae NOT DETECTED NOT DETECTED Final   Neisseria meningitidis NOT DETECTED NOT DETECTED Final  Pseudomonas aeruginosa NOT DETECTED NOT DETECTED Final   Stenotrophomonas maltophilia NOT DETECTED NOT DETECTED Final   Candida albicans NOT DETECTED NOT DETECTED Final   Candida auris NOT DETECTED NOT DETECTED Final   Candida glabrata NOT DETECTED NOT DETECTED Final   Candida krusei NOT DETECTED NOT DETECTED Final   Candida parapsilosis NOT DETECTED NOT DETECTED Final   Candida tropicalis NOT DETECTED NOT DETECTED Final   Cryptococcus neoformans/gattii NOT DETECTED NOT DETECTED Final   CTX-M ESBL NOT DETECTED NOT DETECTED Final   Carbapenem resistance IMP NOT DETECTED NOT DETECTED Final   Carbapenem resistance KPC NOT DETECTED NOT DETECTED Final   Carbapenem resistance NDM NOT DETECTED NOT DETECTED Final   Carbapenem resist OXA 48 LIKE NOT DETECTED NOT DETECTED Final   Carbapenem resistance VIM NOT DETECTED NOT DETECTED Final    Comment: Performed at [facility], [address] [place], [place] [address]  Urine Culture     Status: Abnormal (Preliminary result)   Collection Time: 08/30/23  9:48 AM   Specimen: Urine, Random  Result Value Ref  Range Status   Specimen Description   Final    URINE, RANDOM Performed at [facility], [address], [place], [place] [address]    Special Requests   Final    NONE Reflexed from [id number] Performed at [facility], [address] [place], [place] [address]    Culture (A)  Final    >=100,000 COLONIES/mL Romie Minus NEGATIVE RODS SUSCEPTIBILITIES TO FOLLOW Performed at [facility], [address], [place], [place] [address]    Report Status PENDING  Incomplete  MRSA Next Gen by PCR, Nasal     Status: Abnormal   Collection Time: 08/30/23  6:58 PM   Specimen: Nasal Mucosa; Nasal Swab  Result Value Ref Range Status   MRSA by PCR Next Gen DETECTED (A) NOT DETECTED Final    Comment: CRITICAL RESULT CALLED TO, READ BACK BY AND VERIFIED WITH:  Encarnacion Slates [facility] AT 2127 08/30/23 JG (NOTE) The GeneXpert MRSA Assay (FDA approved for NASAL specimens only), is one component of a comprehensive MRSA colonization surveillance program. It is not intended to diagnose MRSA infection nor to guide or monitor treatment for MRSA infections. Test performance is not FDA approved in patients less than 35 years old. Performed at [facility], [address] [place], [place] [address]          Radiology Studies: [facility] Chest [place] 1 View  Result Date: 08/30/2023 CLINICAL DATA:  Questionable sepsis - evaluate for abnormality. Altered mental status. Possible stroke. EXAM: PORTABLE CHEST 1 VIEW COMPARISON:  03/12/2023. FINDINGS: Low lung volume. There are diffuse increased interstitial markings with central and bibasilar prominence. There are probable atelectatic changes at the left lung base. No dense consolidation. No pneumothorax or large pleural effusions. Moderately enlarged cardio-mediastinal silhouette, which may be accentuated by AP technique and low lung volume. No acute osseous abnormalities. The soft tissues are within normal limits. IMPRESSION: 1. Low lung volume. 2.  Diffuse increased interstitial markings with central and bibasilar prominence, most in keeping with pulmonary edema. Electronically Signed   By: Jules Schick M.D.   On: 08/30/2023 09:53        Scheduled Meds:  aspirin  81 mg Oral QHS   atorvastatin  40 mg Oral Daily   budesonide (PULMICORT) nebulizer solution  0.25 mg Nebulization BID   Chlorhexidine Gluconate Cloth  6 each Topical Q0600   Chlorhexidine Gluconate Cloth  6 each Topical Daily  enoxaparin (LOVENOX) injection  65 mg Subcutaneous Q24H   fesoterodine  4 mg Oral Daily   imipramine  100 mg Oral QHS   ipratropium-albuterol  3 mL Nebulization Q6H   methylPREDNISolone (SOLU-MEDROL) injection  40 mg Intravenous Q12H   mouth rinse  15 mL Mouth Rinse 4 times per day   pantoprazole  40 mg Oral Daily   Continuous Infusions:  sodium chloride 100 mL/hr at 08/31/23 0817   azithromycin Stopped (08/30/23 1900)   cefTRIAXone (ROCEPHIN)  IV       LOS: 1 day       Charise Killian, MD [facility] Pager [phone]  If 7PM-7AM, please contact night-coverage [email or web address] 08/31/2023, 8:51 AM

## 2023-08-31 NOTE — IPAL (Signed)
Interdisciplinary Goals of Care Family Meeting  Patient Name: Carla Huerta   MRN: [id number]   Date of Birth/ Sex: Apr 16, 1951 , female      Admission Date: 08/30/2023  Attending Provider: Emeline General, MD  Primary Diagnosis: Sepsis [facility])      Date carried out: 08/30/23 Location of the meeting: Bedside   Member's involved: NP and Family Member or next of kin   Durable Power of Attorney or acting medical decision maker: Son Ameiah, Shear   Discussion:  Advance Care Planning/Goals of Care discussion was performed during the course of treatment to decide on type of care right for this patient following admission to the ICU.   I met with patient's son Chanetta Marshall to discuss goals of care in details following  change in patient's current status.  Updated on current care plan and reason for transfer to the ICU.  Reviewed labs, vital signs previous images and progress notes with the patient's son.  Answered all his questions   Discussed prognosis, expected outcome with or without ongoing aggressive treatments and the options for de-escalation of care.   Diagnosis(es): Altered mental status secondary to UTI Prognosis: Guarded Code Status: DNR Disposition: ICU Next Steps:  Family understands the situation.  They indicate that patient has a DNR/DNR order in place since 01/16/2023.  Per patient's wishes indicated an advanced directive currently on file, Patient wishes to pursue ongoing treatment, but concurred that if deteriorated to pulselessness, patient would prefer a natural death as opposed to invasive measures such as CPR and intubation.    Family are satisfied with Plan of action and management. All questions answered     Total Time Spent Face to Face addressing advance care planning in the presence of the Patient: 35 minutes     Webb Silversmith, DNP, CCRN, FNP-C, AGACNP-BC Acute Care & Family Nurse Practitioner  [facility]  See Amion for personal pager PCCM on  call pager [phone] until 7 am

## 2023-08-31 NOTE — Plan of Care (Signed)

## 2023-08-31 NOTE — Evaluation (Signed)
Physical Therapy Evaluation Patient Details Name: Carla Huerta MRN: [id number] DOB: 07-09-1951 Today's Date: 08/31/2023  History of Present Illness  Carla Huerta is a 71yoF who [facility] via EMSf ro AMS, ?code stroke. EMS notes safety/wellbeing concerns pertaining to state of household. Pt arriving with fever, elevated LA, tachycardia, admitted under sepsis protocol (UTI). PMH: heart failure, cirrhosis, NASH, COPD, HTN, CKD2, hyponatremia. Pt admitted to SDU for pressure support, required biPAP for meetings saturations. Baseline function includes multiple falls, 4WW AMB.  Clinical Impression  Carla Huerta reports to feel back to baseline on my arrival, denies any frank pain, weakness, dyspnea- this in the context of not having been out of bed yet. Pt is trialed on room air at her suggestion, no SOB in session, remains ~92% SpO2. Pt able to demonstrate SLR and resisted hip extension bilat without pain, vigorous strength. Bed mobility modI. Transfers well, but did not have opportunity to assess lower surface heights. Bed is profoundly soiled which precludes additional mobility assessment, session wrapped up to prioritize getting pt cleaned up. Appears purewick tubing was kinked limiting suction. RN made aware, recommended pt OOB to chair this morning. Pt reports to feel at baseline, but does make clear that she does not walk well at baseline, uses a RW, mostly household distances, frequent falls history. Will continue to follow.       If plan is discharge home, recommend the following: A little help with walking and/or transfers;Assist for transportation;Assistance with cooking/housework;Help with stairs or ramp for entrance   Can travel by private vehicle        Equipment Recommendations None recommended by PT  Recommendations for Other Services       Functional Status Assessment Patient has had a recent decline in their functional status and demonstrates the ability to make  significant improvements in function in a reasonable and predictable amount of time.     Precautions / Restrictions Precautions Precautions: Fall Restrictions Weight Bearing Restrictions: No      Mobility  Bed Mobility Overal bed mobility: Independent                  Transfers Overall transfer level: Modified independent   Transfers: Sit to/from Stand Sit to Stand: Modified independent (Device/Increase time) (hold onto tray table for balance, ICU bed is elevated height for pt)                Ambulation/Gait                  Stairs            Wheelchair Mobility     Tilt Bed    Modified Rankin (Stroke Patients Only)       Balance Overall balance assessment: History of Falls, Modified Independent                                           Pertinent Vitals/Pain Pain Assessment Pain Assessment: No/denies pain    Home Living Family/patient expects to be discharged to:: Private residence Living Arrangements: Spouse/significant other;Children (DTR; son lives nearby as well) Available Help at Discharge: Family Type of Home: House Home Access: Ramped entrance       Home Layout: Multi-level;Able to live on main level with bedroom/bathroom Home Equipment: Rollator (4 wheels);Tub bench Additional Comments: oxygen    Prior Function Prior Level of Function : History  of Falls (last six months)                     Extremity/Trunk Assessment                Communication      Cognition                                                General Comments      Exercises     Assessment/Plan    PT Assessment Patient needs continued PT services  PT Problem List Decreased strength;Decreased range of motion;Decreased activity tolerance;Decreased balance;Decreased mobility;Decreased cognition       PT Treatment Interventions DME instruction;Functional mobility training;Therapeutic  activities;Therapeutic exercise;Neuromuscular re-education;Patient/family education    PT Goals (Current goals can be found in the Care Plan section)  Acute Rehab PT Goals Patient Stated Goal: return to home PT Goal Formulation: With patient Time For Goal Achievement: 09/14/23 Potential to Achieve Goals: Good    Frequency Min 1X/week     Co-evaluation               AM-PAC PT "6 Clicks" Mobility  Outcome Measure Help needed turning from your back to your side while in a flat bed without using bedrails?: None Help needed moving from lying on your back to sitting on the side of a flat bed without using bedrails?: None Help needed moving to and from a bed to a chair (including a wheelchair)?: None Help needed standing up from a chair using your arms (e.g., wheelchair or bedside chair)?: None Help needed to walk in hospital room?: A Little Help needed climbing 3-5 steps with a railing? : A Little 6 Click Score: 22    End of Session Equipment Utilized During Treatment: Oxygen Activity Tolerance: Patient tolerated treatment well;No increased pain Patient left: in bed;with call bell/phone within reach;with family/visitor present Nurse Communication: Mobility status (bed full of pee; pt needs a recliner) PT Visit Diagnosis: Unsteadiness on feet (R26.81);Other abnormalities of gait and mobility (R26.89);Muscle weakness (generalized) (M62.81)    Time: 5784-6962 PT Time Calculation (min) (ACUTE ONLY): 19 min   Charges:   PT Evaluation $PT Eval Moderate Complexity: 1 Mod   PT General Charges $$ ACUTE PT VISIT: 1 Visit        12:22 PM, 08/31/23 Carla Huerta, PT, DPT Physical Therapist - [facility]  [phone] (ASCOM)    Carla Huerta 08/31/2023, 12:17 PM

## 2023-09-01 DIAGNOSIS — K7581 Nonalcoholic steatohepatitis (NASH): Secondary | ICD-10-CM

## 2023-09-01 DIAGNOSIS — K7460 Unspecified cirrhosis of liver: Secondary | ICD-10-CM | POA: Diagnosis not present

## 2023-09-01 DIAGNOSIS — Z515 Encounter for palliative care: Secondary | ICD-10-CM

## 2023-09-01 DIAGNOSIS — N390 Urinary tract infection, site not specified: Secondary | ICD-10-CM | POA: Diagnosis not present

## 2023-09-01 DIAGNOSIS — R4182 Altered mental status, unspecified: Secondary | ICD-10-CM

## 2023-09-01 LAB — CBC
HCT: 36.7 % (ref 36.0–46.0)
Hemoglobin: 12.7 g/dL (ref 12.0–15.0)
MCH: 31.5 pg (ref 26.0–34.0)
MCHC: 34.6 g/dL (ref 30.0–36.0)
MCV: 91.1 fL (ref 80.0–100.0)
Platelets: 106 10*3/uL — ABNORMAL LOW (ref 150–400)
RBC: 4.03 MIL/uL (ref 3.87–5.11)
RDW: 15 % (ref 11.5–15.5)
WBC: 9.6 10*3/uL (ref 4.0–10.5)
nRBC: 0 % (ref 0.0–0.2)

## 2023-09-01 LAB — COMPREHENSIVE METABOLIC PANEL
ALT: 35 U/L (ref 0–44)
AST: 87 U/L — ABNORMAL HIGH (ref 15–41)
Albumin: 3.1 g/dL — ABNORMAL LOW (ref 3.5–5.0)
Alkaline Phosphatase: 64 U/L (ref 38–126)
Anion gap: 8 (ref 5–15)
BUN: 40 mg/dL — ABNORMAL HIGH (ref 8–23)
CO2: 21 mmol/L — ABNORMAL LOW (ref 22–32)
Calcium: 8.7 mg/dL — ABNORMAL LOW (ref 8.9–10.3)
Chloride: 106 mmol/L (ref 98–111)
Creatinine, Ser: 1.26 mg/dL — ABNORMAL HIGH (ref 0.44–1.00)
GFR, Estimated: 46 mL/min — ABNORMAL LOW (ref >60)
Glucose, Bld: 147 mg/dL — ABNORMAL HIGH (ref 70–99)
Potassium: 4.2 mmol/L (ref 3.5–5.1)
Sodium: 135 mmol/L (ref 135–145)
Total Bilirubin: 0.8 mg/dL (ref 0.3–1.2)
Total Protein: 6.4 g/dL — ABNORMAL LOW (ref 6.5–8.1)

## 2023-09-01 LAB — BLOOD GAS, VENOUS
Bicarbonate: 26 mmol/L (ref 20.0–28.0)
O2 Saturation: 36.5 mmol/L (ref 0.0–2.0)
Patient temperature: 37
Patient temperature: 37 %
pCO2, Ven: 43 mm[Hg] — ABNORMAL LOW (ref 44–60)
pH, Ven: 7.39 (ref 7.25–7.43)
pO2, Ven: <31 mmol/L — CL (ref 32–45)

## 2023-09-01 LAB — URINE CULTURE: Culture: >=100000 — AB

## 2023-09-01 LAB — AMMONIA: Ammonia: 41 umol/L — ABNORMAL HIGH (ref 9–35)

## 2023-09-01 MED ORDER — IPRATROPIUM-ALBUTEROL 0.5-2.5 (3) MG/3ML IN SOLN
3.0000 mL | Freq: Two times a day (BID) | RESPIRATORY_TRACT | Status: DC
  Administered 2023-09-01 – 2023-09-02 (×2): 3 mL via RESPIRATORY_TRACT
  Filled 2023-09-01 (×2): qty 3

## 2023-09-01 NOTE — Progress Notes (Signed)
PROGRESS NOTE    Carla Huerta  KVQ:[id number] DOB: 03/09/1951 DOA: 08/30/2023 PCP: Hillery Aldo, MD   Assessment & Plan:   Principal Problem:   Sepsis [facility]) Active Problems:   Sepsis secondary to UTI (HCC)   AMS (altered mental status)  Assessment and Plan:  Acute hypoxic respiratory failure: likely secondary to pulmonary edema, diastolic CHF & COPD exacerbation. 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 Continue on IV steroids, bronchodilators, IV rocephin, azithromycin. Weaned off of supplemental oxygen. Resolved   Sepsis: see Dr. Clovis Fredrickson notes on how pt met sepsis criteria. Likely secondary to e.coli bacteremia from urinary source & possibly pneumonia. Continue on IV rocephin & azithromycin.   Bacteremia: blood cxs growing e. coli. Continue on IV rocephin. Repeat blood cxs are pending. Secondary to UTI.   UTI: urine cx growing e. coli. 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 Continue on lactulose prn. Holding home dose of aldactone and can possibly restart tomorrow   CKDIIIb: Cr is trending down from day prior. Avoid nephrotoxic meds   Hyponatremia: WNL today   Hypokalemia: WNL today   HTN: holding home dose of aldactone   HLD: continue on statin   Thrombocytopenia: likely secondary to NASH cirrhosis. Labile    DVT prophylaxis: lovenox  Code Status: full  Family Communication:  Disposition Plan: likely d/c back home w/ HH   Level of care: Telemetry Medical Status is: Inpatient Remains inpatient appropriate because: severity of illness     Consultants:  ICU   Procedures:   Antimicrobials: azithromycin, rocephin   Subjective: Pt c/o malaise   Objective: Vitals:   08/31/23 2011 08/31/23 2053 09/01/23 0720 09/01/23 0800  BP: 128/67   (!) 104/54  Pulse: (!) 103   95  Resp:    17  Temp:    98.3 F (36.8 C)  TempSrc:    Oral  SpO2: 93% 92% 94% 98%  Weight:        Intake/Output Summary (Last 24 hours) at 09/01/2023 0814 Last data  filed at 09/01/2023 0622 Gross per 24 hour  Intake 1369.79 ml  Output 750 ml  Net 619.79 ml   Filed Weights   08/30/23 0904 08/30/23 1854  Weight: 133.2 kg 132.7 kg    Examination:  General exam: appears comfortable  Respiratory system: diminished breath sounds b/l  Cardiovascular system: S1/S2+. No rubs or clicks   Gastrointestinal system: Abd is soft, NT, obese & hypoactive bowel sounds  Central nervous system: alert & awake. Moves all extremities  Psychiatry: Judgement and insight appears improved. Appropriate mood and affect    Data Reviewed: I have personally reviewed following labs and imaging studies  CBC: Recent Labs  Lab 08/30/23 0920 08/31/23 0630 09/01/23 0455  WBC 11.9* 9.7 9.6  NEUTROABS 10.4*  --   --   HGB 14.2 12.6 12.7  HCT 42.5 37.9 36.7  MCV 93.4 94.0 91.1  PLT 105* 89* 106*   Basic Metabolic Panel: Recent Labs  Lab 08/30/23 0920 08/31/23 0630 09/01/23 0455  NA 132* 134* 135  K 3.2* 3.3* 4.2  CL 100 104 106  CO2 21* 20* 21*  GLUCOSE 167* 164* 147*  BUN 24* 34* 40*  CREATININE 1.37* 1.32* 1.26*  CALCIUM 9.5 8.3* 8.7*   GFR: Estimated Creatinine Clearance: 57.3 mL/min (A) (by C-G formula based on SCr of 1.26 mg/dL (H)). Liver Function Tests: Recent Labs  Lab 08/30/23 0920 09/01/23 0455  AST 69* 87*  ALT 25 35  ALKPHOS 82  64  BILITOT 1.9* 0.8  PROT 7.8 6.4*  ALBUMIN 3.6 3.1*   No results for input(s): "LIPASE", "AMYLASE" in the last 168 hours. Recent Labs  Lab 08/30/23 1507 09/01/23 0455  AMMONIA 34 41*   Coagulation Profile: Recent Labs  Lab 08/30/23 1257  INR 2.1*   Cardiac Enzymes: No results for input(s): "CKTOTAL", "CKMB", "CKMBINDEX", "TROPONINI" in the last 168 hours. BNP (last 3 results) No results for input(s): "PROBNP" in the last 8760 hours. HbA1C: No results for input(s): "HGBA1C" in the last 72 hours. CBG: Recent Labs  Lab 08/30/23 1850 08/31/23 2011  GLUCAP 138* 119*   Lipid Profile: No results  for input(s): "CHOL", "HDL", "LDLCALC", "TRIG", "CHOLHDL", "LDLDIRECT" in the last 72 hours. Thyroid Function Tests: Recent Labs    08/30/23 1507  TSH 0.631   Anemia Panel: No results for input(s): "VITAMINB12", "FOLATE", "FERRITIN", "TIBC", "IRON", "RETICCTPCT" in the last 72 hours. Sepsis Labs: Recent Labs  Lab 08/30/23 0920 08/30/23 1118  PROCALCITON 0.65  --   LATICACIDVEN 3.9* 2.8*    Recent Results (from the past 240 hour(s))  Resp panel by RT-PCR (RSV, Flu A&B, Covid) Anterior Nasal Swab     Status: None   Collection Time: 08/30/23  9:19 AM   Specimen: Anterior Nasal Swab  Result Value Ref Range Status   SARS Coronavirus 2 by RT PCR NEGATIVE NEGATIVE Final    Comment: (NOTE) SARS-CoV-2 target nucleic acids are NOT DETECTED.  The SARS-CoV-2 RNA is generally detectable in upper respiratory specimens during the acute phase of infection. The lowest concentration of SARS-CoV-2 viral copies this assay can detect is 138 copies/mL. A negative result does not preclude SARS-Cov-2 infection and should not be used as the sole basis for treatment or other patient management decisions. A negative result may occur with  improper specimen collection/handling, submission of specimen other than nasopharyngeal swab, presence of viral mutation(s) within the areas targeted by this assay, and inadequate number of viral copies(<138 copies/mL). A negative result must be combined with clinical observations, patient history, and epidemiological information. The expected result is Negative.  Fact Sheet for Patients:  BloggerCourse.com  Fact Sheet for Healthcare Providers:  SeriousBroker.it  This test is no t yet approved or cleared by the [place] FDA and  has been authorized for detection and/or diagnosis of SARS-CoV-2 by FDA under an Emergency Use Authorization (EUA). This EUA will remain  in effect (meaning this test can be used)  for the duration of the COVID-19 declaration under Section 564(b)(1) of the Act, 21 U.S.C.section 360bbb-3(b)(1), unless the authorization is terminated  or revoked sooner.       Influenza A by PCR NEGATIVE NEGATIVE Final   Influenza B by PCR NEGATIVE NEGATIVE Final    Comment: (NOTE) The Xpert Xpress SARS-CoV-2/FLU/RSV plus assay is intended as an aid in the diagnosis of influenza from Nasopharyngeal swab specimens and should not be used as a sole basis for treatment. Nasal washings and aspirates are unacceptable for Xpert Xpress SARS-CoV-2/FLU/RSV testing.  Fact Sheet for Patients: BloggerCourse.com  Fact Sheet for Healthcare Providers: SeriousBroker.it  This test is not yet approved or cleared by the [place] FDA and has been authorized for detection and/or diagnosis of SARS-CoV-2 by FDA under an Emergency Use Authorization (EUA). This EUA will remain in effect (meaning this test can be used) for the duration of the COVID-19 declaration under Section 564(b)(1) of the Act, 21 U.S.C. section 360bbb-3(b)(1), unless the authorization is terminated or revoked.  Resp Syncytial Virus by PCR NEGATIVE NEGATIVE Final    Comment: (NOTE) Fact Sheet for Patients: BloggerCourse.com  Fact Sheet for Healthcare Providers: SeriousBroker.it  This test is not yet approved or cleared by the [place] FDA and has been authorized for detection and/or diagnosis of SARS-CoV-2 by FDA under an Emergency Use Authorization (EUA). This EUA will remain in effect (meaning this test can be used) for the duration of the COVID-19 declaration under Section 564(b)(1) of the Act, 21 U.S.C. section 360bbb-3(b)(1), unless the authorization is terminated or revoked.  Performed at [facility], [address] [place], [place] [address]   Blood Culture (routine x 2)     Status: None  (Preliminary result)   Collection Time: 08/30/23  9:20 AM   Specimen: BLOOD  Result Value Ref Range Status   Specimen Description BLOOD BLOOD RIGHT ARM  Final   Special Requests   Final    BOTTLES DRAWN AEROBIC AND ANAEROBIC Blood Culture adequate volume   Culture  Setup Time   Final    Organism ID to follow GRAM NEGATIVE RODS ANAEROBIC BOTTLE ONLY CRITICAL RESULT CALLED TO, READ BACK BY AND VERIFIED WITH:  MADISON HUNT 08/30/2023 2109 CP Performed at [facility], [address] [place], [place] [address]    Culture GRAM NEGATIVE RODS  Final   Report Status PENDING  Incomplete  Blood Culture (routine x 2)     Status: None (Preliminary result)   Collection Time: 08/30/23  9:20 AM   Specimen: BLOOD  Result Value Ref Range Status   Specimen Description BLOOD BLOOD LEFT ARM  Final   Special Requests   Final    BOTTLES DRAWN AEROBIC AND ANAEROBIC Blood Culture adequate volume   Culture  Setup Time   Final    GRAM NEGATIVE RODS IN BOTH AEROBIC AND ANAEROBIC BOTTLES CRITICAL RESULT CALLED TO, READ BACK BY AND VERIFIED WITH:  MADISON HUNT 08/30/2023 2109 CP Performed at [facility], [address] [place], [place] [address]    Culture GRAM NEGATIVE RODS  Final   Report Status PENDING  Incomplete  Blood Culture ID Panel (Reflexed)     Status: Abnormal   Collection Time: 08/30/23  9:20 AM  Result Value Ref Range Status   Enterococcus faecalis NOT DETECTED NOT DETECTED Final   Enterococcus Faecium NOT DETECTED NOT DETECTED Final   Listeria monocytogenes NOT DETECTED NOT DETECTED Final   Staphylococcus species NOT DETECTED NOT DETECTED Final   Staphylococcus aureus (BCID) NOT DETECTED NOT DETECTED Final   Staphylococcus epidermidis NOT DETECTED NOT DETECTED Final   Staphylococcus lugdunensis NOT DETECTED NOT DETECTED Final   Streptococcus species NOT DETECTED NOT DETECTED Final   Streptococcus agalactiae NOT DETECTED NOT DETECTED Final   Streptococcus  pneumoniae NOT DETECTED NOT DETECTED Final   Streptococcus pyogenes NOT DETECTED NOT DETECTED Final   A.calcoaceticus-baumannii NOT DETECTED NOT DETECTED Final   Bacteroides fragilis NOT DETECTED NOT DETECTED Final   Enterobacterales DETECTED (A) NOT DETECTED Final    Comment: Enterobacterales represent a large order of gram negative bacteria, not a single organism. CRITICAL RESULT CALLED TO, READ BACK BY AND VERIFIED WITH:  MADISON HUNT 08/30/2023 2109 CP    Enterobacter cloacae complex NOT DETECTED NOT DETECTED Final   Escherichia coli DETECTED (A) NOT DETECTED Final    Comment: CRITICAL RESULT CALLED TO, READ BACK BY AND VERIFIED WITH:  MADISON HUNT 08/30/2023 2109 CP    Klebsiella aerogenes NOT DETECTED NOT DETECTED Final   Klebsiella oxytoca  NOT DETECTED NOT DETECTED Final   Klebsiella pneumoniae NOT DETECTED NOT DETECTED Final   Proteus species NOT DETECTED NOT DETECTED Final   Salmonella species NOT DETECTED NOT DETECTED Final   Serratia marcescens NOT DETECTED NOT DETECTED Final   Haemophilus influenzae NOT DETECTED NOT DETECTED Final   Neisseria meningitidis NOT DETECTED NOT DETECTED Final   Pseudomonas aeruginosa NOT DETECTED NOT DETECTED Final   Stenotrophomonas maltophilia NOT DETECTED NOT DETECTED Final   Candida albicans NOT DETECTED NOT DETECTED Final   Candida auris NOT DETECTED NOT DETECTED Final   Candida glabrata NOT DETECTED NOT DETECTED Final   Candida krusei NOT DETECTED NOT DETECTED Final   Candida parapsilosis NOT DETECTED NOT DETECTED Final   Candida tropicalis NOT DETECTED NOT DETECTED Final   Cryptococcus neoformans/gattii NOT DETECTED NOT DETECTED Final   CTX-M ESBL NOT DETECTED NOT DETECTED Final   Carbapenem resistance IMP NOT DETECTED NOT DETECTED Final   Carbapenem resistance KPC NOT DETECTED NOT DETECTED Final   Carbapenem resistance NDM NOT DETECTED NOT DETECTED Final   Carbapenem resist OXA 48 LIKE NOT DETECTED NOT DETECTED Final   Carbapenem  resistance VIM NOT DETECTED NOT DETECTED Final    Comment: Performed at [facility], [address], [place], [place] [address]  Urine Culture     Status: Abnormal (Preliminary result)   Collection Time: 08/30/23  9:48 AM   Specimen: Urine, Random  Result Value Ref Range Status   Specimen Description   Final    URINE, RANDOM Performed at [facility], [address], [place], [place] [address]    Special Requests   Final    NONE Reflexed from [id number] Performed at [facility], [address] [place], [place] [address]    Culture (A)  Final    >=100,000 COLONIES/mL ESCHERICHIA COLI SUSCEPTIBILITIES TO FOLLOW Performed at [facility], [address] [place], [place] [address]    Report Status PENDING  Incomplete  MRSA Next Gen by PCR, Nasal     Status: Abnormal   Collection Time: 08/30/23  6:58 PM   Specimen: Nasal Mucosa; Nasal Swab  Result Value Ref Range Status   MRSA by PCR Next Gen DETECTED (A) NOT DETECTED Final    Comment: CRITICAL RESULT CALLED TO, READ BACK BY AND VERIFIED WITH:  Encarnacion Slates [facility] AT 2127 08/30/23 JG (NOTE) The GeneXpert MRSA Assay (FDA approved for NASAL specimens only), is one component of a comprehensive MRSA colonization surveillance program. It is not intended to diagnose MRSA infection nor to guide or monitor treatment for MRSA infections. Test performance is not FDA approved in patients less than 60 years old. Performed at [facility], [address], [place], [place] [address]          Radiology Studies: [facility] Chest Solomon 1 View  Result Date: 08/30/2023 CLINICAL DATA:  Questionable sepsis - evaluate for abnormality. Altered mental status. Possible stroke. EXAM: PORTABLE CHEST 1 VIEW COMPARISON:  03/12/2023. FINDINGS: Low lung volume. There are diffuse increased interstitial markings with central and bibasilar prominence. There are probable atelectatic changes at the left lung base. No dense  consolidation. No pneumothorax or large pleural effusions. Moderately enlarged cardio-mediastinal silhouette, which may be accentuated by AP technique and low lung volume. No acute osseous abnormalities. The soft tissues are within normal limits. IMPRESSION: 1. Low lung volume. 2. Diffuse increased interstitial markings with central and bibasilar prominence, most in keeping with pulmonary edema. Electronically Signed   By: Timoteo Expose.D.  On: 08/30/2023 09:53        Scheduled Meds:  aspirin  81 mg Oral QHS   atorvastatin  40 mg Oral Daily   budesonide (PULMICORT) nebulizer solution  0.25 mg Nebulization BID   Chlorhexidine Gluconate Cloth  6 each Topical Daily   enoxaparin (LOVENOX) injection  65 mg Subcutaneous Q24H   fesoterodine  4 mg Oral Daily   imipramine  100 mg Oral QHS   ipratropium-albuterol  3 mL Nebulization TID   methylPREDNISolone (SOLU-MEDROL) injection  40 mg Intravenous Q12H   mouth rinse  15 mL Mouth Rinse 4 times per day   pantoprazole  40 mg Oral Daily   Continuous Infusions:  azithromycin 250 mL/hr at 08/31/23 1706   cefTRIAXone (ROCEPHIN)  IV Stopped (08/31/23 1031)     LOS: 2 days       Charise Killian, MD [facility] Pager [phone]  If 7PM-7AM, please contact night-coverage [email or web address] 09/01/2023, 8:14 AM

## 2023-09-01 NOTE — Plan of Care (Signed)
  Problem: Activity: Goal: Risk for activity intolerance will decrease Outcome: Progressing   Problem: Nutrition: Goal: Adequate nutrition will be maintained Outcome: Progressing   Problem: Elimination: Goal: Will not experience complications related to bowel motility Outcome: Progressing   Problem: Skin Integrity: Goal: Risk for impaired skin integrity will decrease Outcome: Progressing

## 2023-09-01 NOTE — Plan of Care (Signed)

## 2023-09-01 NOTE — Progress Notes (Signed)
   09/01/23 1600  Spiritual Encounters  Type of Visit Initial  Care provided to: Patient  Conversation partners present during encounter Nurse  Referral source Chaplain assessment  Reason for visit Routine spiritual support  OnCall Visit No  Interventions  Spiritual Care Interventions Made Established relationship of care and support;Compassionate presence;Reflective listening;Prayer;Encouragement  Intervention Outcomes  Outcomes Connection to spiritual care;Awareness of support  Spiritual Care Plan  Spiritual Care Issues Still Outstanding No further spiritual care needs at this time (see row info)   Chaplain spiritual support services remain available as the need arises.

## 2023-09-01 NOTE — Plan of Care (Signed)
  Problem: Clinical Measurements: Goal: Respiratory complications will improve Outcome: Progressing Goal: Cardiovascular complication will be avoided Outcome: Progressing   Problem: Nutrition: Goal: Adequate nutrition will be maintained Outcome: Progressing   Problem: Coping: Goal: Level of anxiety will decrease Outcome: Progressing   Problem: Pain Managment: Goal: General experience of comfort will improve Outcome: Progressing   Problem: Safety: Goal: Ability to remain free from injury will improve Outcome: Progressing

## 2023-09-01 NOTE — Progress Notes (Addendum)
Physical Therapy Treatment Patient Details Name: ZURIANA MCGURL MRN: [id number] DOB: March 01, 1951 Today's Date: 09/01/2023   History of Present Illness Jaquasha Gotts is a 71yoF who [facility] on 08/30/23 via EMSf ro AMS, ?code stroke. EMS notes safety/wellbeing concerns pertaining to state of household. Pt arriving with fever, elevated LA, tachycardia, admitted under sepsis protocol (UTI). PMH: heart failure, cirrhosis, NASH, COPD, HTN, CKD2, hyponatremia. Pt admitted to SDU for pressure support, required biPAP for meetings saturations. Baseline function includes multiple falls, 4WW AMB.    PT Comments  Pt alert and oriented upon entry and very pleasant to work with. Pt denies any pain t/o session and reports she is functioning at baseline. Mod I for STS transfers from both the recliner and BSC. Amb 10' with RW and CGA, but limited by need to urinate. Anticipate bringing a brief to each session for ambulation 2/2 pt reporting hx of urinary incontinence with mobility. Pt demonstrated good standing balance during application of brief for ambulation. Continued amb 30' with RW and CGA. Pt slightly unsteady with postural sway and SOB, but no apparent LOB. Pt reports she uses a rollator at baseline for breaks and that the furthest she walks is to her bathroom. Pt reports fatigue 9/10 RPE after ambulation. Pt would continue to benefit from skilled PT to maximize independence and safety at discharge.    If plan is discharge home, recommend the following: A little help with walking and/or transfers;Assist for transportation;Assistance with cooking/housework;Help with stairs or ramp for entrance   Can travel by private vehicle        Equipment Recommendations  None recommended by PT (pt has rollator at home)    Recommendations for Other Services       Precautions / Restrictions Precautions Precautions: Fall Restrictions Weight Bearing Restrictions: No     Mobility  Bed Mobility                     Transfers Overall transfer level: Modified independent Equipment used: Rolling walker (2 wheels) Transfers: Sit to/from Stand Sit to Stand: Modified independent (Device/Increase time)           General transfer comment: SpO2 92% in sitting, 91% in standing (room air).STS x 2 with mod I (one from recliner, one from [facility]).    Ambulation/Gait Ambulation/Gait assistance: Contact guard assist Gait Distance (Feet): 40 Feet (10 ft to BSC, 30 ft around room) Assistive device: Rolling walker (2 wheels)         General Gait Details: Slightly unsteady with postural sway and SOB, but no apparent LOB. CGA for safety. Pt reports she uses a rollator at baseline so she can take breaks and the furthest she walks is to her bathroom. Pt reports fatigue 9/10 RPE after amb   Stairs             Wheelchair Mobility     Tilt Bed    Modified Rankin (Stroke Patients Only)       Balance Overall balance assessment: History of Falls, Modified Independent                                          Cognition Arousal: Alert Behavior During Therapy: WFL for tasks assessed/performed Overall Cognitive Status: Within Functional Limits for tasks assessed  Exercises      General Comments        Pertinent Vitals/Pain Pain Assessment Pain Assessment: No/denies pain    Home Living                          Prior Function            PT Goals (current goals can now be found in the care plan section) Acute Rehab PT Goals Patient Stated Goal: return to home PT Goal Formulation: With patient Time For Goal Achievement: 09/14/23 Potential to Achieve Goals: Good Progress towards PT goals: Progressing toward goals    Frequency    Min 1X/week      PT Plan      Co-evaluation              AM-PAC PT "6 Clicks" Mobility   Outcome Measure  Help needed turning from your back to  your side while in a flat bed without using bedrails?: None Help needed moving from lying on your back to sitting on the side of a flat bed without using bedrails?: None Help needed moving to and from a bed to a chair (including a wheelchair)?: None Help needed standing up from a chair using your arms (e.g., wheelchair or bedside chair)?: None Help needed to walk in hospital room?: A Little Help needed climbing 3-5 steps with a railing? : A Lot 6 Click Score: 21    End of Session Equipment Utilized During Treatment: Gait belt;Other (comment) (brief) Activity Tolerance: Patient tolerated treatment well;No increased pain Patient left: in chair;with call bell/phone within reach;with chair alarm set;with nursing/sitter in room Nurse Communication: Mobility status PT Visit Diagnosis: Unsteadiness on feet (R26.81);Other abnormalities of gait and mobility (R26.89);Muscle weakness (generalized) (M62.81)     Time: 3810-1751 PT Time Calculation (min) (ACUTE ONLY): 21 min  Charges:    $Gait Training: 8-22 mins PT General Charges $$ ACUTE PT VISIT: 1 Visit                        Shauna Hugh, SPT 09/01/2023, 4:12 PM

## 2023-09-01 NOTE — Consult Note (Signed)
Consultation Note Date: 09/01/2023 at 1345  Patient Name: Carla Huerta  DOB: 21-May-1951  MRN: [id number]  Age / Sex: 72 y.o., female  PCP: Hillery Aldo, MD Referring Physician: Charise Killian, MD  HPI/Patient Profile: 72 y.o. female  with past medical history of chronic HFpEF, cirrhosis (NASH), COPD, HTN, CKD (stage II), chronic hyponatremia admitted on 08/30/2023 with AMS.   Patient is being treated for sepsis likely secondary to E. coli bacteremia from urinary source and possible pneumonia, acute hypoxic respiratory failure likely secondary to pulmonary edema, diastolic CHF and COPD exacerbation, UTI, bacteremia, hyponatremia, hypokalemia, and cirrhosis.  PMT was consulted to discuss goals of care.  Clinical Assessment and Goals of Care: Extensive chart review completed prior to meeting patient including labs, vital signs, imaging, progress notes, orders, and available advanced directive documents from current and previous encounters. I then met with patient at bedside to discuss diagnosis prognosis, GOC, EOL wishes, disposition and options.  I introduced Palliative Medicine as specialized medical care for people living with serious illness. It focuses on providing relief from the symptoms and stress of a serious illness. The goal is to improve quality of life for both the patient and the family.  We discussed a brief life review of the patient.  Patient is recently widowed (husband passed at [facility] in April of this year).  She has 2 children, a son and a daughter, and several grandchildren.  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  As far as functional and nutritional status patient endorses that she gets around just fine at home.  She denies trouble with p.o. intake.  She endorses unsteadiness but would prefer to be at home and be placed back in a nursing facility  for rehab/care.  We discussed patient's current illness and what it means in the larger context of patient's on-going co-morbidities.  Discussed chronic nature of CHF, COPD, and cirrhosis.  Patient endorses she does well at home and that every now and then has these "spells" where she gets "not like myself and confused".  Discussed labile nature of cirrhosis with elevated ammonia levels and altered mental status.  The patient is familiar as she shares this has been happening to her for quite some time.    I attempted to elicit values and goals of care important to the patient.  Patient is determined to go home and have home health/home PT/OT if available.  She is adamant that she does not want to be placed in nursing facility for rehab.  She lives at home with her daughter and several grandchildren who can help take care of her.  She is appropriately tearful in speaking of the recent deaths and morning of her husband.  He died unexpectedly.  Therapeutic silence, active listening, and emotional support provided.  Advance directives, concepts specific to code status, artificial feeding and hydration, and rehospitalization were considered and discussed.  As her HCPOA in epic reflects, patient's next of kin/surrogate decision maker is her son  Carla Highman (JimBob) and then next in line would be her daughter-in-law Carla Huerta.  Patient confirms DNR and DNI status.  She would never be accepting of CPR, ACLS, or intubation.  She would want to allow a natural passing if she became pulseless and not breathing.  Symptoms assessed.  Patient denies acute complaints such as chest pain, confusion, headache, N/V/D.  No adjustment to [facility] needed at this time.   Discussed with patient the importance of continued conversation with family and the medical providers regarding overall plan of care and treatment options, ensuring decisions are within the context of the patient's values and GOCs.    Goals are clear.  Symptom burden  is low.  PMT remains available to patient and family throughout her hospitalization we will monitor her peripherally.  Primary Decision Maker PATIENT  Physical Exam Vitals reviewed.  Constitutional:      General: She is not in acute distress.    Appearance: She is obese.  HENT:     Head: Normocephalic.     Mouth/Throat:     Mouth: Mucous membranes are moist.  Eyes:     Pupils: Pupils are equal, round, and reactive to light.  Pulmonary:     Effort: Pulmonary effort is normal.  Abdominal:     Palpations: Abdomen is soft.  Musculoskeletal:     Comments: MAETC  Skin:    General: Skin is warm and dry.  Neurological:     Mental Status: She is alert and oriented to person, place, and time.  Psychiatric:        Mood and Affect: Mood normal.        Behavior: Behavior normal.        Thought Content: Thought content normal.        Judgment: Judgment normal.     Palliative Assessment/Data: 60%     Thank you for this consult. Palliative medicine will continue to follow and assist holistically.   Time Total: 75 minutes  Time spent includes: Detailed review of medical records (labs, imaging, vital signs), medically appropriate exam (mental status, respiratory, cardiac, skin), discussed with treatment team, counseling and educating patient, family and staff, documenting clinical information, medication management and coordination of care.  Signed by: Georgiann Cocker, DNP, FNP-BC Palliative Medicine   Please contact Palliative Medicine Team providers via [facility] for questions and concerns.

## 2023-09-02 DIAGNOSIS — N390 Urinary tract infection, site not specified: Secondary | ICD-10-CM | POA: Diagnosis not present

## 2023-09-02 DIAGNOSIS — G9341 Metabolic encephalopathy: Secondary | ICD-10-CM | POA: Diagnosis not present

## 2023-09-02 DIAGNOSIS — Z515 Encounter for palliative care: Secondary | ICD-10-CM | POA: Diagnosis not present

## 2023-09-02 DIAGNOSIS — A419 Sepsis, unspecified organism: Secondary | ICD-10-CM | POA: Diagnosis not present

## 2023-09-02 DIAGNOSIS — R6520 Severe sepsis without septic shock: Secondary | ICD-10-CM | POA: Diagnosis not present

## 2023-09-02 DIAGNOSIS — R4182 Altered mental status, unspecified: Secondary | ICD-10-CM | POA: Diagnosis not present

## 2023-09-02 LAB — CBC
HCT: 37.7 % (ref 36.0–46.0)
Hemoglobin: 12.8 g/dL (ref 12.0–15.0)
MCH: 31.4 pg (ref 26.0–34.0)
MCHC: 34 g/dL (ref 30.0–36.0)
MCV: 92.6 fL (ref 80.0–100.0)
Platelets: 113 10*3/uL — ABNORMAL LOW (ref 150–400)
RBC: 4.07 MIL/uL (ref 3.87–5.11)
RDW: 15.3 % (ref 11.5–15.5)
WBC: 6.8 10*3/uL (ref 4.0–10.5)
nRBC: 0 % (ref 0.0–0.2)

## 2023-09-02 LAB — CULTURE, BLOOD (ROUTINE X 2)
Special Requests: ADEQUATE
Special Requests: ADEQUATE

## 2023-09-02 LAB — COMPREHENSIVE METABOLIC PANEL
ALT: 46 U/L — ABNORMAL HIGH (ref 0–44)
AST: 77 U/L — ABNORMAL HIGH (ref 15–41)
Albumin: 3.2 g/dL — ABNORMAL LOW (ref 3.5–5.0)
Alkaline Phosphatase: 66 U/L (ref 38–126)
Anion gap: 10 (ref 5–15)
BUN: 39 mg/dL — ABNORMAL HIGH (ref 8–23)
CO2: 21 mmol/L — ABNORMAL LOW (ref 22–32)
Calcium: 9 mg/dL (ref 8.9–10.3)
Chloride: 107 mmol/L (ref 98–111)
Creatinine, Ser: 1.28 mg/dL — ABNORMAL HIGH (ref 0.44–1.00)
GFR, Estimated: 45 mL/min — ABNORMAL LOW (ref >60)
Glucose, Bld: 127 mg/dL — ABNORMAL HIGH (ref 70–99)
Potassium: 4.1 mmol/L (ref 3.5–5.1)
Sodium: 138 mmol/L (ref 135–145)
Total Bilirubin: 0.9 mg/dL (ref 0.3–1.2)
Total Protein: 6.8 g/dL (ref 6.5–8.1)

## 2023-09-02 LAB — AMMONIA: Ammonia: 54 umol/L — ABNORMAL HIGH (ref 9–35)

## 2023-09-02 MED ORDER — MUPIROCIN 2 % EX OINT
1.0000 | TOPICAL_OINTMENT | Freq: Two times a day (BID) | CUTANEOUS | Status: DC
  Administered 2023-09-02: 1 via NASAL
  Filled 2023-09-02: qty 22

## 2023-09-02 MED ORDER — PREDNISONE 20 MG PO TABS: 40.0000 mg | ORAL_TABLET | Freq: Every day | ORAL | 0 refills | Status: AC

## 2023-09-02 MED ORDER — CEFDINIR 300 MG PO CAPS: 300.0000 mg | ORAL_CAPSULE | Freq: Two times a day (BID) | ORAL | 0 refills | Status: AC

## 2023-09-02 NOTE — TOC Transition Note (Signed)
Transition of Care [facility]) - CM/SW Discharge Note   Patient Details  Name: Carla Huerta MRN: [id number] Date of Birth: 08-Aug-1951  Transition of Care [facility]) CM/SW Contact:  Margarito Liner, LCSW Phone Number: 09/02/2023, 1:42 PM   Clinical Narrative:  Patient has orders to discharge home today. [facility] liaison is aware. No further concerns. CSW signing off.   Final next level of care: Home w Home Health Services Barriers to Discharge: Barriers Resolved   Patient Goals and CMS Choice CMS Medicare.gov Compare Post Acute Care list provided to:: Patient Choice offered to / list presented to : Patient  Discharge Placement                  Patient to be transferred to facility by: Daughter-in-law   Patient and family notified of of transfer: 09/02/23  Discharge Plan and Services Additional resources added to the After Visit Summary for       Post Acute Care Choice: Home Health                    HH Arranged: RN, PT, OT [facility] Agency: [facility] Date [facility] Agency Contacted: 09/02/23   Representative spoke with at [facility] Agency: [employer] [facility]  Social Determinants of Health (SDOH) Interventions SDOH Screenings   Food Insecurity: No Food Insecurity (03/13/2023)  Housing: Low Risk  (03/13/2023)  Transportation Needs: No Transportation Needs (03/13/2023)  Utilities: Not At Risk (03/13/2023)  Tobacco Use: High Risk (08/30/2023)     Readmission Risk Interventions    09/02/2023   11:19 AM 03/14/2023   11:12 AM  Readmission Risk Prevention Plan  Transportation Screening Complete   PCP or Specialist Appt within 3-5 Days Complete   HRI or Home Care Consult Complete   Social Work Consult for Recovery Care Planning/Counseling Complete   Palliative Care Screening Complete   Medication Review Oceanographer) Complete   PCP or Specialist appointment within 3-5 days of discharge  Complete  HRI or Home Care Consult  Complete  SW Recovery Care/Counseling Consult   Complete  Palliative Care Screening  Not Applicable  Skilled Nursing Facility  Not Applicable

## 2023-09-02 NOTE — Progress Notes (Signed)
Discharge instructions reviewed with patient and daughter in law including followup visits and new medications.  Understanding was verbalized and all questions were answered.  IV removed without complication; patient tolerated well.  Patient discharged home via wheelchair in stable condition escorted by nursing staff.

## 2023-09-02 NOTE — Progress Notes (Signed)
Palliative Care Progress Note, Assessment & Plan   Patient Name: NATOYIA CASHDOLLAR       Date: 09/02/2023 DOB: December 29, 1950  Age: 72 y.o. MRN#: [id number] Attending Physician: Charise Killian, MD Primary Care Physician: Hillery Aldo, MD Admit Date: 08/30/2023  Subjective: Patient is out of bed and sitting in recliner.  She acknowledges my presence and is able to make her wishes known.  She is alert and oriented x 4.  No family or friends present during my visit.  HPI: 72 y.o. female  with past medical history of chronic HFpEF, cirrhosis (NASH), COPD, HTN, CKD (stage II), chronic hyponatremia admitted on 08/30/2023 with AMS.    Patient is being treated for sepsis likely secondary to E. coli bacteremia from urinary source and possible pneumonia, acute hypoxic respiratory failure likely secondary to pulmonary edema, diastolic CHF and COPD exacerbation, UTI, bacteremia, hyponatremia, hypokalemia, and cirrhosis.   PMT was consulted to discuss goals of care.  Summary of counseling/coordination of care: Extensive chart review completed prior to meeting patient including labs, vital signs, imaging, progress notes, orders, and available advanced directive documents from current and previous encounters.   After reviewing the patient's chart and assessing the patient at bedside, I spoke with patient in regards to symptom management and plan of care.  Symptoms assessed.  Patient denies confusion, weakness, nausea, headache, chest pain, or other acute issues at this time.  She endorses she feels "like my old self again".  We discussed next steps.  Patient plans to discharge home with [facility].  TOC following closely for disposition.  We again outlined patient's goals and boundaries of care.  DNR with limited  interventions remains.  Patient has named her son as her NOK/HCPOA.  Discharge summary is in place.  Patient plans to discharge home today.  No further acute palliative needs at this time.  Physical Exam Vitals reviewed.  Constitutional:      General: She is not in acute distress.    Appearance: She is obese.  HENT:     Head: Normocephalic.     Nose: Nose normal.     Mouth/Throat:     Mouth: Mucous membranes are moist.  Eyes:     Pupils: Pupils are equal, round, and reactive to light.  Pulmonary:     Effort: Pulmonary effort is normal.  Abdominal:     Palpations: Abdomen is soft.  Skin:    General: Skin is warm and dry.  Neurological:     Mental Status: She is alert and oriented to person, place, and time.  Psychiatric:        Mood and Affect: Mood normal.        Behavior: Behavior normal.        Thought Content: Thought content normal.        Judgment: Judgment normal.             Total Time 25 minutes   Time spent includes: Detailed review of medical records (labs, imaging, vital signs), medically appropriate exam (mental status, respiratory, cardiac, skin), discussed with treatment team, counseling and educating patient, family and staff, documenting clinical information, medication management and coordination of care.  Samara Deist L. Bonita Quin, DNP, FNP-BC Palliative  Medicine Team

## 2023-09-02 NOTE — TOC Initial Note (Signed)
Transition of Care [facility]) - Initial/Assessment Note    Patient Details  Name: Carla Huerta MRN: [id number] Date of Birth: [birth year]  Transition of Care [facility]) CM/SW Contact:    Margarito Liner, LCSW Phone Number: 09/02/2023, 11:21 AM  Clinical Narrative:   Readmission prevention screen complete. CSW met with patient. No supports at bedside. CSW introduced role and explained that discharge planning would be discussed. PCP is Hillery Aldo, MD at [facility] clinic. Her daughter drives her to appointments. She uses the [facility] as well as a [facility] but she's not sure which one. No issues obtaining her medications. She lives home with her daughter, son-in-law, and grandchildren. No home health prior to admission. She was previously active with [facility] and reports having a good experience with them. She is willing to work with them again and they have accepted referral for PT, OT, RN. Patient has a rollator, wheelchair, BSC, and shower chair at home. She reports that her bathroom is handicapped-accessible. No further concerns. CSW encouraged patient to contact CSW as needed. CSW will continue to follow patient for support and facilitate return home at discharge. Her daughter-in-law will transport her home at discharge.               Expected Discharge Plan: Home w Home Health Services Barriers to Discharge: Continued Medical Work up   Patient Goals and CMS Choice   CMS Medicare.gov Compare Post Acute Care list provided to:: Patient Choice offered to / list presented to : Patient      Expected Discharge Plan and Services     Post Acute Care Choice: Home Health Living arrangements for the past 2 months: Single Family Home                           HH Arranged: RN, PT, OT [facility] Agency: [facility] Date [facility] Agency Contacted: 09/02/23   Representative spoke with at [facility] Agency: [facility]  Prior Living Arrangements/Services Living arrangements for the past 2  months: Single Family Home Lives with:: Adult Children, Relatives Patient language and need for interpreter reviewed:: Yes Do you feel safe going back to the place where you live?: Yes      Need for Family Participation in Patient Care: Yes (Comment) Care giver support system in place?: Yes (comment) Current home services: DME Criminal Activity/Legal Involvement Pertinent to Current Situation/Hospitalization: No - Comment as needed  Activities of Daily Living      Permission Sought/Granted Permission sought to share information with : Facility Industrial/product designer granted to share information with : Yes, Verbal Permission Granted     Permission granted to share info w AGENCY: Home Health Agencies        Emotional Assessment Appearance:: Appears stated age Attitude/Demeanor/Rapport: Engaged, Gracious Affect (typically observed): Accepting, Appropriate, Calm, Pleasant Orientation: : Oriented to Self, Oriented to Place, Oriented to  Time, Oriented to Situation Alcohol / Substance Use: Not Applicable Psych Involvement: No (comment)  Admission diagnosis:  Sepsis [facility]) [A41.9] Patient Active Problem List   Diagnosis Date Noted   Sepsis (HCC) 08/30/2023   AMS (altered mental status) 03/12/2023   Hyponatremia 03/12/2023   Hypercalcemia 12/10/2022   Dyslipidemia 12/05/2022   Peripheral neuropathy 12/05/2022   Obstructive sleep apnea 12/05/2022   Metabolic acidosis 12/05/2022   Thrombocytopenia, acquired (HCC) 12/05/2022   Chronic kidney disease, stage 3b (HCC) 12/05/2022   Chronic diastolic CHF (congestive heart failure) (HCC) 12/05/2022   COPD (  chronic obstructive pulmonary disease) (HCC) 12/05/2022   Essential hypertension 12/05/2022   Hepatic encephalopathy (HCC) 12/04/2022   Fall at home, initial encounter 11/23/2022   Anemia of chronic disease 11/23/2022   Generalized weakness 11/23/2022   Shortness of breath 11/22/2022   Morbid obesity with BMI of  45.0-49.9, adult (HCC) 11/22/2022   AKI (acute kidney injury) (HCC) 11/22/2022   Hyperlipidemia 11/22/2022   Gout 11/22/2022   Depression 11/22/2022   Sleep apnea 10/31/2022   Liver cirrhosis secondary to NASH (nonalcoholic steatohepatitis) (HCC) 10/31/2022   Acute CHF (congestive heart failure) (HCC) 10/30/2022   Acute hepatic encephalopathy (HCC) 05/04/2022   Sepsis secondary to UTI (HCC) 05/03/2022   Acute pyelonephritis 05/03/2022   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/12/2019   Iron deficiency anemia 10/14/2016   Blood loss anemia    Hernia, hiatal    Pancytopenia (HCC) 07/25/2016   Gallstone    PCP:  Hillery Aldo, MD Pharmacy:   [facility] [phone] - [place], [place] - [address] AT [facility] [address] [place] [address] [place] [address] Phone: [phone] Fax: [phone]     Social Determinants of Health (SDOH) Social History: SDOH Screenings   Food Insecurity: No Food Insecurity (03/13/2023)  Housing: Low Risk  (03/13/2023)  Transportation Needs: No Transportation Needs (03/13/2023)  Utilities: Not At Risk (03/13/2023)  Tobacco Use: High Risk (08/30/2023)   SDOH Interventions:     Readmission Risk Interventions    09/02/2023   11:19 AM 03/14/2023   11:12 AM  Readmission Risk Prevention Plan  Transportation Screening Complete   PCP or Specialist Appt within 3-5 Days Complete   HRI or Home Care Consult Complete   Social Work Consult for Recovery Care Planning/Counseling Complete   Palliative Care Screening Complete   Medication Review Oceanographer) Complete   PCP or Specialist appointment within 3-5 days of discharge  Complete  HRI or Home Care Consult  Complete  SW Recovery Care/Counseling Consult  Complete  Palliative Care Screening  Not Applicable  Skilled Nursing Facility  Not Applicable

## 2023-09-02 NOTE — Care Management Important Message (Signed)
Important Message  Patient Details  Name: Carla Huerta MRN: [id number] Date of Birth: April 16, 1951   Important Message Given:  Yes - Medicare IM     Margarito Liner, LCSW 09/02/2023, 11:19 AM

## 2023-09-02 NOTE — Discharge Summary (Signed)
Physician Discharge Summary  [name] MVH:[id number] DOB: 11/01/1951 DOA: 08/30/2023  PCP: Carla Aldo, MD  Admit date: 08/30/2023 Discharge date: 09/02/2023  Admitted From: home  Disposition:  home w/ home health   Recommendations for Outpatient Follow-up:  Follow up with PCP in 1-2 weeks   Home Health: yes Equipment/Devices:  Discharge Condition: stable  CODE STATUS: full  Diet recommendation: Heart Healthy / Carb Modified / Regular / Dysphagia   Brief/Interim Summary: HPI was taken from Dr. Irving Burton: Carla Huerta is a 72 y.o. female with medical history significant of chronic HFpEF, cirrhosis/NASH, COPD, HTN, CKD stage II, chronic hyponatremia, sent by family member for evaluation of worsening of mentations changes.   Symptoms started this morning, patient started to become confused in the morning and found patient talking nonsense.  Soon getting worse, unable to engage in any conversation and family decided sent her to ED. Son reported the patient was coherent yesterday and had no complaints, and family did not elicit any cough urinary complaints or diarrhea.  No fever yesterday   ED Course: Febrile 101.8, tachycardia 126, blood pressure 140/70, O2 saturation 97% on 2 L.  Tachypneic breathing rate 24.  Chest x-ray showed lung congested.  UA compatible with UTI with microscopic hematuria, WBC 11.9, creatinine 1.3, hemoglobin 14.2.   Patient was given IV bolus 1000 x 1 and vancomycin cefepime     Discharge Diagnoses:  Principal Problem:   Sepsis (HCC) Active Problems:   Sepsis secondary to UTI (HCC)   AMS (altered mental status)   Acute hypoxic respiratory failure: likely secondary to pulmonary edema, diastolic CHF & COPD exacerbation. 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 Continue on IV steroids, bronchodilators, IV rocephin, azithromycin. Weaned off of supplemental oxygen. Resolved   Sepsis: see Dr. Clovis Huerta notes on how pt met sepsis criteria. Likely  secondary to e.coli bacteremia from urinary source & possibly pneumonia. Continue on IV rocephin & azithromycin while inpatient. Sepsis resolved.   Bacteremia: blood cxs growing e. coli. Continue on IV rocephin while inpatient and d/c home po cefdinir. Repeat blood cxs NGTD. Secondary to UTI.   UTI: urine cx growing e. coli. 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 Continue on lactulose prn. 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 Avoid nephrotoxic meds   Hyponatremia: WNL today   Hypokalemia: WNL today   HTN: restart home dose of aldactone   HLD: continue on statin   Thrombocytopenia: likely secondary to NASH cirrhosis. Labile   Discharge Instructions  Discharge Instructions     Diet general   Complete by: As directed    Discharge instructions   Complete by: As directed    F/u w/ PCP in 1-2 weeks.   Increase activity slowly   Complete by: As directed    No wound care   Complete by: As directed       Allergies as of 09/02/2023       Reactions   Latex Rash   Local reaction where it touches her body        Medication List     TAKE these medications    albuterol 108 (90 Base) MCG/ACT inhaler Commonly known as: VENTOLIN HFA Inhale 2 puffs into the lungs every 6 (six) hours as needed for wheezing or shortness of breath.   albuterol (2.5 MG/3ML) 0.083% nebulizer solution Commonly known as: PROVENTIL Take 3 mLs (2.5 mg total) by nebulization every 4 (  four) hours as needed for wheezing or shortness of breath.   aspirin 81 MG chewable tablet Chew 81 mg by mouth at bedtime.   atorvastatin 40 MG tablet Commonly known as: LIPITOR Take 40 mg by mouth daily.   budesonide 0.25 MG/2ML nebulizer solution Commonly known as: PULMICORT Take 2 mLs (0.25 mg total) by nebulization 2 (two) times daily.   cefdinir 300 MG capsule Commonly known as: OMNICEF Take 1 capsule (300 mg total) by mouth 2 (two)  times daily for 7 days.   imipramine 50 MG tablet Commonly known as: TOFRANIL Take 100 mg by mouth at bedtime.   lactulose 10 GM/15ML solution Commonly known as: CHRONULAC Take 20 g (30 mL) 1-3 times a day for a goal of 1-2 bowel movements a day.   Mitigare 0.6 MG Caps Generic drug: Colchicine Take 0.6-1.2 mg by mouth daily as needed (acute gout flare).   omeprazole 40 MG capsule Commonly known as: PRILOSEC Take 40 mg by mouth daily.   predniSONE 20 MG tablet Commonly known as: DELTASONE Take 2 tablets (40 mg total) by mouth daily with breakfast for 5 days.   spironolactone 50 MG tablet Commonly known as: ALDACTONE Take 50 mg by mouth daily.   tolterodine 4 MG 24 hr capsule Commonly known as: DETROL LA Take 4 mg by mouth at bedtime.        Follow-up Information     [facility].   Specialty: Home Health Services Why: They will follow up with you for your home health needs. Contact information: [address] [place] [address] [phone]                Allergies  Allergen Reactions   Latex Rash    Local reaction where it touches her body    Consultations: ICU    Procedures/Studies: DG Chest Port 1 View  Result Date: 08/30/2023 CLINICAL DATA:  Questionable sepsis - evaluate for abnormality. Altered mental status. Possible stroke. EXAM: PORTABLE CHEST 1 VIEW COMPARISON:  03/12/2023. FINDINGS: Low lung volume. There are diffuse increased interstitial markings with central and bibasilar prominence. There are probable atelectatic changes at the left lung base. No dense consolidation. No pneumothorax or large pleural effusions. Moderately enlarged cardio-mediastinal silhouette, which may be accentuated by AP technique and low lung volume. No acute osseous abnormalities. The soft tissues are within normal limits. IMPRESSION: 1. Low lung volume. 2. Diffuse increased interstitial markings with central and bibasilar prominence,  most in keeping with pulmonary edema. Electronically Signed   By: Jules Schick M.D.   On: 08/30/2023 09:53   (Echo, Carotid, EGD, Colonoscopy, ERCP)    Subjective: Pt c/o fatigue   Discharge Exam: Vitals:   09/02/23 0717 09/02/23 0813  BP:  127/61  Pulse:  93  Resp:  16  Temp:  98.5 F (36.9 C)  SpO2: 95% 94%   Vitals:   09/01/23 2016 09/02/23 0012 09/02/23 0717 09/02/23 0813  BP:  132/67  127/61  Pulse:  (!) 101  93  Resp:  17  16  Temp:  98.9 F (37.2 C)  98.5 F (36.9 C)  TempSrc:      SpO2: 93% 93% 95% 94%  Weight:        General: Pt is alert, awake, not in acute distress Cardiovascular: S1/S2 +, no rubs, no gallops Respiratory: decreased breath sounds b/l  Abdominal: Soft, NT, obese, bowel sounds + Extremities: no cyanosis    The results of significant diagnostics  from this hospitalization (including imaging, microbiology, ancillary and laboratory) are listed below for reference.     Microbiology: Recent Results (from the past 240 hour(s))  Resp panel by RT-PCR (RSV, Flu A&B, Covid) Anterior Nasal Swab     Status: None   Collection Time: 08/30/23  9:19 AM   Specimen: Anterior Nasal Swab  Result Value Ref Range Status   SARS Coronavirus 2 by RT PCR NEGATIVE NEGATIVE Final    Comment: (NOTE) SARS-CoV-2 target nucleic acids are NOT DETECTED.  The SARS-CoV-2 RNA is generally detectable in upper respiratory specimens during the acute phase of infection. The lowest concentration of SARS-CoV-2 viral copies this assay can detect is 138 copies/mL. A negative result does not preclude SARS-Cov-2 infection and should not be used as the sole basis for treatment or other patient management decisions. A negative result may occur with  improper specimen collection/handling, submission of specimen other than nasopharyngeal swab, presence of viral mutation(s) within the areas targeted by this assay, and inadequate number of viral copies(<138 copies/mL). A negative  result must be combined with clinical observations, patient history, and epidemiological information. The expected result is Negative.  Fact Sheet for Patients:  BloggerCourse.com  Fact Sheet for Healthcare Providers:  SeriousBroker.it  This test is no t yet approved or cleared by the [place] FDA and  has been authorized for detection and/or diagnosis of SARS-CoV-2 by FDA under an Emergency Use Authorization (EUA). This EUA will remain  in effect (meaning this test can be used) for the duration of the COVID-19 declaration under Section 564(b)(1) of the Act, 21 U.S.C.section 360bbb-3(b)(1), unless the authorization is terminated  or revoked sooner.       Influenza A by PCR NEGATIVE NEGATIVE Final   Influenza B by PCR NEGATIVE NEGATIVE Final    Comment: (NOTE) The Xpert Xpress SARS-CoV-2/FLU/RSV plus assay is intended as an aid in the diagnosis of influenza from Nasopharyngeal swab specimens and should not be used as a sole basis for treatment. Nasal washings and aspirates are unacceptable for Xpert Xpress SARS-CoV-2/FLU/RSV testing.  Fact Sheet for Patients: BloggerCourse.com  Fact Sheet for Healthcare Providers: SeriousBroker.it  This test is not yet approved or cleared by the [place] FDA and has been authorized for detection and/or diagnosis of SARS-CoV-2 by FDA under an Emergency Use Authorization (EUA). This EUA will remain in effect (meaning this test can be used) for the duration of the COVID-19 declaration under Section 564(b)(1) of the Act, 21 U.S.C. section 360bbb-3(b)(1), unless the authorization is terminated or revoked.     Resp Syncytial Virus by PCR NEGATIVE NEGATIVE Final    Comment: (NOTE) Fact Sheet for Patients: BloggerCourse.com  Fact Sheet for Healthcare Providers: SeriousBroker.it  This  test is not yet approved or cleared by the [place] FDA and has been authorized for detection and/or diagnosis of SARS-CoV-2 by FDA under an Emergency Use Authorization (EUA). This EUA will remain in effect (meaning this test can be used) for the duration of the COVID-19 declaration under Section 564(b)(1) of the Act, 21 U.S.C. section 360bbb-3(b)(1), unless the authorization is terminated or revoked.  Performed at [facility], [address] [place], [place] [address]   Blood Culture (routine x 2)     Status: Abnormal   Collection Time: 08/30/23  9:20 AM   Specimen: BLOOD  Result Value Ref Range Status   Specimen Description   Final    BLOOD BLOOD RIGHT ARM Performed at [facility], [address] [place],  [place] [address]    Special Requests   Final    BOTTLES DRAWN AEROBIC AND ANAEROBIC Blood Culture adequate volume Performed at [facility], [address] [place], [place] [address]    Culture  Setup Time   Final    GRAM NEGATIVE RODS ANAEROBIC BOTTLE ONLY CRITICAL RESULT CALLED TO, READ BACK BY AND VERIFIED WITH:  MADISON HUNT 08/30/2023 2109 CP Performed at [facility], [address], [place], [place] [address]    Culture ESCHERICHIA COLI (A)  Final   Report Status 09/02/2023 FINAL  Final   Organism ID, Bacteria ESCHERICHIA COLI  Final      Susceptibility   Escherichia coli - MIC*    AMPICILLIN >=32 RESISTANT Resistant     CEFEPIME <=0.12 SENSITIVE Sensitive     CEFTAZIDIME <=1 SENSITIVE Sensitive     CEFTRIAXONE <=0.25 SENSITIVE Sensitive     CIPROFLOXACIN <=0.25 SENSITIVE Sensitive     GENTAMICIN <=1 SENSITIVE Sensitive     IMIPENEM <=0.25 SENSITIVE Sensitive     TRIMETH/SULFA <=20 SENSITIVE Sensitive     AMPICILLIN/SULBACTAM 16 INTERMEDIATE Intermediate     PIP/TAZO <=4 SENSITIVE Sensitive ug/mL    * ESCHERICHIA COLI  Blood Culture (routine x 2)     Status: Abnormal   Collection Time: 08/30/23  9:20 AM    Specimen: BLOOD  Result Value Ref Range Status   Specimen Description   Final    BLOOD BLOOD LEFT ARM Performed at [facility], [address], [place], [place] [address]    Special Requests   Final    BOTTLES DRAWN AEROBIC AND ANAEROBIC Blood Culture adequate volume Performed at [facility], [address] [place], [place] [address]    Culture  Setup Time   Final    GRAM NEGATIVE RODS IN BOTH AEROBIC AND ANAEROBIC BOTTLES CRITICAL RESULT CALLED TO, READ BACK BY AND VERIFIED WITH:  MADISON HUNT 08/30/2023 2109 CP Performed at [facility], [address] [place], [place] [address]    Culture (A)  Final    ESCHERICHIA COLI SUSCEPTIBILITIES PERFORMED ON PREVIOUS CULTURE WITHIN THE LAST 5 DAYS. Performed at [facility], [address], [place], [place] [address]    Report Status 09/02/2023 FINAL  Final  Blood Culture ID Panel (Reflexed)     Status: Abnormal   Collection Time: 08/30/23  9:20 AM  Result Value Ref Range Status   Enterococcus faecalis NOT DETECTED NOT DETECTED Final   Enterococcus Faecium NOT DETECTED NOT DETECTED Final   Listeria monocytogenes NOT DETECTED NOT DETECTED Final   Staphylococcus species NOT DETECTED NOT DETECTED Final   Staphylococcus aureus (BCID) NOT DETECTED NOT DETECTED Final   Staphylococcus epidermidis NOT DETECTED NOT DETECTED Final   Staphylococcus lugdunensis NOT DETECTED NOT DETECTED Final   Streptococcus species NOT DETECTED NOT DETECTED Final   Streptococcus agalactiae NOT DETECTED NOT DETECTED Final   Streptococcus pneumoniae NOT DETECTED NOT DETECTED Final   Streptococcus pyogenes NOT DETECTED NOT DETECTED Final   A.calcoaceticus-baumannii NOT DETECTED NOT DETECTED Final   Bacteroides fragilis NOT DETECTED NOT DETECTED Final   Enterobacterales DETECTED (A) NOT DETECTED Final    Comment: Enterobacterales represent a large order of gram negative bacteria, not a single organism. CRITICAL RESULT CALLED  TO, READ BACK BY AND VERIFIED WITH:  MADISON HUNT 08/30/2023 2109 CP    Enterobacter cloacae complex NOT DETECTED NOT DETECTED Final   Escherichia coli DETECTED (A) NOT DETECTED Final    Comment: CRITICAL RESULT  CALLED TO, READ BACK BY AND VERIFIED WITH:  MADISON HUNT 08/30/2023 2109 CP    Klebsiella aerogenes NOT DETECTED NOT DETECTED Final   Klebsiella oxytoca NOT DETECTED NOT DETECTED Final   Klebsiella pneumoniae NOT DETECTED NOT DETECTED Final   Proteus species NOT DETECTED NOT DETECTED Final   Salmonella species NOT DETECTED NOT DETECTED Final   Serratia marcescens NOT DETECTED NOT DETECTED Final   Haemophilus influenzae NOT DETECTED NOT DETECTED Final   Neisseria meningitidis NOT DETECTED NOT DETECTED Final   Pseudomonas aeruginosa NOT DETECTED NOT DETECTED Final   Stenotrophomonas maltophilia NOT DETECTED NOT DETECTED Final   Candida albicans NOT DETECTED NOT DETECTED Final   Candida auris NOT DETECTED NOT DETECTED Final   Candida glabrata NOT DETECTED NOT DETECTED Final   Candida krusei NOT DETECTED NOT DETECTED Final   Candida parapsilosis NOT DETECTED NOT DETECTED Final   Candida tropicalis NOT DETECTED NOT DETECTED Final   Cryptococcus neoformans/gattii NOT DETECTED NOT DETECTED Final   CTX-M ESBL NOT DETECTED NOT DETECTED Final   Carbapenem resistance IMP NOT DETECTED NOT DETECTED Final   Carbapenem resistance KPC NOT DETECTED NOT DETECTED Final   Carbapenem resistance NDM NOT DETECTED NOT DETECTED Final   Carbapenem resist OXA 48 LIKE NOT DETECTED NOT DETECTED Final   Carbapenem resistance VIM NOT DETECTED NOT DETECTED Final    Comment: Performed at [facility], [address], [place], [place] [address]  Urine Culture     Status: Abnormal   Collection Time: 08/30/23  9:48 AM   Specimen: Urine, Random  Result Value Ref Range Status   Specimen Description   Final    URINE, RANDOM Performed at [facility], [address] [place], [place]  [address]    Special Requests   Final    NONE Reflexed from [id number] Performed at [facility], [address] [place], [place] [address]    Culture >=100,000 COLONIES/mL ESCHERICHIA COLI (A)  Final   Report Status 09/01/2023 FINAL  Final   Organism ID, Bacteria ESCHERICHIA COLI (A)  Final      Susceptibility   Escherichia coli - MIC*    AMPICILLIN >=32 RESISTANT Resistant     CEFAZOLIN <=4 SENSITIVE Sensitive     CEFEPIME <=0.12 SENSITIVE Sensitive     CEFTRIAXONE <=0.25 SENSITIVE Sensitive     CIPROFLOXACIN <=0.25 SENSITIVE Sensitive     GENTAMICIN <=1 SENSITIVE Sensitive     IMIPENEM <=0.25 SENSITIVE Sensitive     NITROFURANTOIN <=16 SENSITIVE Sensitive     TRIMETH/SULFA <=20 SENSITIVE Sensitive     AMPICILLIN/SULBACTAM 16 INTERMEDIATE Intermediate     PIP/TAZO <=4 SENSITIVE Sensitive ug/mL    * >=100,000 COLONIES/mL ESCHERICHIA COLI  MRSA Next Gen by PCR, Nasal     Status: Abnormal   Collection Time: 08/30/23  6:58 PM   Specimen: Nasal Mucosa; Nasal Swab  Result Value Ref Range Status   MRSA by PCR Next Gen DETECTED (A) NOT DETECTED Final    Comment: CRITICAL RESULT CALLED TO, READ BACK BY AND VERIFIED WITH:  Encarnacion Slates [facility] AT 2127 08/30/23 JG (NOTE) The GeneXpert MRSA Assay (FDA approved for NASAL specimens only), is one component of a comprehensive MRSA colonization surveillance program. It is not intended to diagnose MRSA infection nor to guide or monitor treatment for MRSA infections. Test performance is not FDA approved in patients less than 16 years old. Performed at [facility], [address], [place], [place] [address]   Culture, blood (Routine X 2) w  Reflex to ID Panel     Status: None (Preliminary result)   Collection Time: 09/01/23  8:28 AM   Specimen: BLOOD LEFT HAND  Result Value Ref Range Status   Specimen Description BLOOD LEFT HAND  Final   Special Requests   Final    BOTTLES DRAWN AEROBIC AND ANAEROBIC Blood Culture adequate volume    Culture   Final    NO GROWTH < 24 HOURS Performed at [facility], [address] [place], [place] [address]    Report Status PENDING  Incomplete  Culture, blood (Routine X 2) w Reflex to ID Panel     Status: None (Preliminary result)   Collection Time: 09/01/23  8:37 AM   Specimen: BLOOD RIGHT ARM  Result Value Ref Range Status   Specimen Description BLOOD RIGHT ARM  Final   Special Requests   Final    BOTTLES DRAWN AEROBIC AND ANAEROBIC Blood Culture adequate volume   Culture   Final    NO GROWTH < 24 HOURS Performed at [facility], [address] [place], [place] [address]    Report Status PENDING  Incomplete     Labs: BNP (last 3 results) Recent Labs    12/01/22 1112 03/12/23 1605 08/30/23 0920  BNP 39.6 19.0 117.4*   Basic Metabolic Panel: Recent Labs  Lab 08/30/23 0920 08/31/23 0630 09/01/23 0455 09/02/23 0445  NA 132* 134* 135 138  K 3.2* 3.3* 4.2 4.1  CL 100 104 106 107  CO2 21* 20* 21* 21*  GLUCOSE 167* 164* 147* 127*  BUN 24* 34* 40* 39*  CREATININE 1.37* 1.32* 1.26* 1.28*  CALCIUM 9.5 8.3* 8.7* 9.0   Liver Function Tests: Recent Labs  Lab 08/30/23 0920 09/01/23 0455 09/02/23 0445  AST 69* 87* 77*  ALT 25 35 46*  ALKPHOS 82 64 66  BILITOT 1.9* 0.8 0.9  PROT 7.8 6.4* 6.8  ALBUMIN 3.6 3.1* 3.2*   No results for input(s): "LIPASE", "AMYLASE" in the last 168 hours. Recent Labs  Lab 08/30/23 1507 09/01/23 0455 09/02/23 0445  AMMONIA 34 41* 54*   CBC: Recent Labs  Lab 08/30/23 0920 08/31/23 0630 09/01/23 0455 09/02/23 0445  WBC 11.9* 9.7 9.6 6.8  NEUTROABS 10.4*  --   --   --   HGB 14.2 12.6 12.7 12.8  HCT 42.5 37.9 36.7 37.7  MCV 93.4 94.0 91.1 92.6  PLT 105* 89* 106* 113*   Cardiac Enzymes: No results for input(s): "CKTOTAL", "CKMB", "CKMBINDEX", "TROPONINI" in the last 168 hours. BNP: Invalid input(s): "POCBNP" CBG: Recent Labs  Lab 08/30/23 1850 08/31/23 2011  GLUCAP 138* 119*   D-Dimer No  results for input(s): "DDIMER" in the last 72 hours. Hgb A1c No results for input(s): "HGBA1C" in the last 72 hours. Lipid Profile No results for input(s): "CHOL", "HDL", "LDLCALC", "TRIG", "CHOLHDL", "LDLDIRECT" in the last 72 hours. Thyroid function studies Recent Labs    08/30/23 1507  TSH 0.631   Anemia work up No results for input(s): "VITAMINB12", "FOLATE", "FERRITIN", "TIBC", "IRON", "RETICCTPCT" in the last 72 hours. Urinalysis    Component Value Date/Time   COLORURINE AMBER (A) 08/30/2023 0948   APPEARANCEUR HAZY (A) 08/30/2023 0948   APPEARANCEUR Clear 04/24/2012 2129   LABSPEC 1.017 08/30/2023 0948   LABSPEC 1.012 04/24/2012 2129   PHURINE 5.0 08/30/2023 0948   GLUCOSEU NEGATIVE 08/30/2023 0948   GLUCOSEU Negative 04/24/2012 2129   HGBUR LARGE (A) 08/30/2023 0948   BILIRUBINUR NEGATIVE 08/30/2023 0948   BILIRUBINUR Negative  04/24/2012 2129   KETONESUR NEGATIVE 08/30/2023 0948   PROTEINUR 100 (A) 08/30/2023 0948   NITRITE NEGATIVE 08/30/2023 0948   LEUKOCYTESUR SMALL (A) 08/30/2023 0948   LEUKOCYTESUR Negative 04/24/2012 2129   Sepsis Labs Recent Labs  Lab 08/30/23 0920 08/31/23 0630 09/01/23 0455 09/02/23 0445  WBC 11.9* 9.7 9.6 6.8   Microbiology Recent Results (from the past 240 hour(s))  Resp panel by RT-PCR (RSV, Flu A&B, Covid) Anterior Nasal Swab     Status: None   Collection Time: 08/30/23  9:19 AM   Specimen: Anterior Nasal Swab  Result Value Ref Range Status   SARS Coronavirus 2 by RT PCR NEGATIVE NEGATIVE Final    Comment: (NOTE) SARS-CoV-2 target nucleic acids are NOT DETECTED.  The SARS-CoV-2 RNA is generally detectable in upper respiratory specimens during the acute phase of infection. The lowest concentration of SARS-CoV-2 viral copies this assay can detect is 138 copies/mL. A negative result does not preclude SARS-Cov-2 infection and should not be used as the sole basis for treatment or other patient management decisions. A negative  result may occur with  improper specimen collection/handling, submission of specimen other than nasopharyngeal swab, presence of viral mutation(s) within the areas targeted by this assay, and inadequate number of viral copies(<138 copies/mL). A negative result must be combined with clinical observations, patient history, and epidemiological information. The expected result is Negative.  Fact Sheet for Patients:  BloggerCourse.com  Fact Sheet for Healthcare Providers:  SeriousBroker.it  This test is no t yet approved or cleared by the [place] FDA and  has been authorized for detection and/or diagnosis of SARS-CoV-2 by FDA under an Emergency Use Authorization (EUA). This EUA will remain  in effect (meaning this test can be used) for the duration of the COVID-19 declaration under Section 564(b)(1) of the Act, 21 U.S.C.section 360bbb-3(b)(1), unless the authorization is terminated  or revoked sooner.       Influenza A by PCR NEGATIVE NEGATIVE Final   Influenza B by PCR NEGATIVE NEGATIVE Final    Comment: (NOTE) The Xpert Xpress SARS-CoV-2/FLU/RSV plus assay is intended as an aid in the diagnosis of influenza from Nasopharyngeal swab specimens and should not be used as a sole basis for treatment. Nasal washings and aspirates are unacceptable for Xpert Xpress SARS-CoV-2/FLU/RSV testing.  Fact Sheet for Patients: BloggerCourse.com  Fact Sheet for Healthcare Providers: SeriousBroker.it  This test is not yet approved or cleared by the [place] FDA and has been authorized for detection and/or diagnosis of SARS-CoV-2 by FDA under an Emergency Use Authorization (EUA). This EUA will remain in effect (meaning this test can be used) for the duration of the COVID-19 declaration under Section 564(b)(1) of the Act, 21 U.S.C. section 360bbb-3(b)(1), unless the authorization is  terminated or revoked.     Resp Syncytial Virus by PCR NEGATIVE NEGATIVE Final    Comment: (NOTE) Fact Sheet for Patients: BloggerCourse.com  Fact Sheet for Healthcare Providers: SeriousBroker.it  This test is not yet approved or cleared by the [place] FDA and has been authorized for detection and/or diagnosis of SARS-CoV-2 by FDA under an Emergency Use Authorization (EUA). This EUA will remain in effect (meaning this test can be used) for the duration of the COVID-19 declaration under Section 564(b)(1) of the Act, 21 U.S.C. section 360bbb-3(b)(1), unless the authorization is terminated or revoked.  Performed at [facility], [address], [place], [place] [address]   Blood Culture (routine x 2)     Status: Abnormal  Collection Time: 08/30/23  9:20 AM   Specimen: BLOOD  Result Value Ref Range Status   Specimen Description   Final    BLOOD BLOOD RIGHT ARM Performed at [facility], [address], [place], [place] [address]    Special Requests   Final    BOTTLES DRAWN AEROBIC AND ANAEROBIC Blood Culture adequate volume Performed at [facility], [address] [place], [place] [address]    Culture  Setup Time   Final    GRAM NEGATIVE RODS ANAEROBIC BOTTLE ONLY CRITICAL RESULT CALLED TO, READ BACK BY AND VERIFIED WITH:  MADISON HUNT 08/30/2023 2109 CP Performed at [facility], [address] [place], [place] [address]    Culture ESCHERICHIA COLI (A)  Final   Report Status 09/02/2023 FINAL  Final   Organism ID, Bacteria ESCHERICHIA COLI  Final      Susceptibility   Escherichia coli - MIC*    AMPICILLIN >=32 RESISTANT Resistant     CEFEPIME <=0.12 SENSITIVE Sensitive     CEFTAZIDIME <=1 SENSITIVE Sensitive     CEFTRIAXONE <=0.25 SENSITIVE Sensitive     CIPROFLOXACIN <=0.25 SENSITIVE Sensitive     GENTAMICIN <=1 SENSITIVE Sensitive     IMIPENEM <=0.25 SENSITIVE Sensitive      TRIMETH/SULFA <=20 SENSITIVE Sensitive     AMPICILLIN/SULBACTAM 16 INTERMEDIATE Intermediate     PIP/TAZO <=4 SENSITIVE Sensitive ug/mL    * ESCHERICHIA COLI  Blood Culture (routine x 2)     Status: Abnormal   Collection Time: 08/30/23  9:20 AM   Specimen: BLOOD  Result Value Ref Range Status   Specimen Description   Final    BLOOD BLOOD LEFT ARM Performed at [facility], [address], [place], [place] [address]    Special Requests   Final    BOTTLES DRAWN AEROBIC AND ANAEROBIC Blood Culture adequate volume Performed at [facility], [address] [place], [place] [address]    Culture  Setup Time   Final    GRAM NEGATIVE RODS IN BOTH AEROBIC AND ANAEROBIC BOTTLES CRITICAL RESULT CALLED TO, READ BACK BY AND VERIFIED WITH:  MADISON HUNT 08/30/2023 2109 CP Performed at [facility], [address] [place], [place] [address]    Culture (A)  Final    ESCHERICHIA COLI SUSCEPTIBILITIES PERFORMED ON PREVIOUS CULTURE WITHIN THE LAST 5 DAYS. Performed at [facility], [address] [place], [place] [address]    Report Status 09/02/2023 FINAL  Final  Blood Culture ID Panel (Reflexed)     Status: Abnormal   Collection Time: 08/30/23  9:20 AM  Result Value Ref Range Status   Enterococcus faecalis NOT DETECTED NOT DETECTED Final   Enterococcus Faecium NOT DETECTED NOT DETECTED Final   Listeria monocytogenes NOT DETECTED NOT DETECTED Final   Staphylococcus species NOT DETECTED NOT DETECTED Final   Staphylococcus aureus (BCID) NOT DETECTED NOT DETECTED Final   Staphylococcus epidermidis NOT DETECTED NOT DETECTED Final   Staphylococcus lugdunensis NOT DETECTED NOT DETECTED Final   Streptococcus species NOT DETECTED NOT DETECTED Final   Streptococcus agalactiae NOT DETECTED NOT DETECTED Final   Streptococcus pneumoniae NOT DETECTED NOT DETECTED Final   Streptococcus pyogenes NOT DETECTED NOT DETECTED Final   A.calcoaceticus-baumannii NOT  DETECTED NOT DETECTED Final   Bacteroides fragilis NOT DETECTED NOT DETECTED Final   Enterobacterales DETECTED (A) NOT DETECTED Final    Comment: Enterobacterales represent a large order of gram negative bacteria, not a single organism. CRITICAL RESULT  CALLED TO, READ BACK BY AND VERIFIED WITH:  MADISON HUNT 08/30/2023 2109 CP    Enterobacter cloacae complex NOT DETECTED NOT DETECTED Final   Escherichia coli DETECTED (A) NOT DETECTED Final    Comment: CRITICAL RESULT CALLED TO, READ BACK BY AND VERIFIED WITH:  MADISON HUNT 08/30/2023 2109 CP    Klebsiella aerogenes NOT DETECTED NOT DETECTED Final   Klebsiella oxytoca NOT DETECTED NOT DETECTED Final   Klebsiella pneumoniae NOT DETECTED NOT DETECTED Final   Proteus species NOT DETECTED NOT DETECTED Final   Salmonella species NOT DETECTED NOT DETECTED Final   Serratia marcescens NOT DETECTED NOT DETECTED Final   Haemophilus influenzae NOT DETECTED NOT DETECTED Final   Neisseria meningitidis NOT DETECTED NOT DETECTED Final   Pseudomonas aeruginosa NOT DETECTED NOT DETECTED Final   Stenotrophomonas maltophilia NOT DETECTED NOT DETECTED Final   Candida albicans NOT DETECTED NOT DETECTED Final   Candida auris NOT DETECTED NOT DETECTED Final   Candida glabrata NOT DETECTED NOT DETECTED Final   Candida krusei NOT DETECTED NOT DETECTED Final   Candida parapsilosis NOT DETECTED NOT DETECTED Final   Candida tropicalis NOT DETECTED NOT DETECTED Final   Cryptococcus neoformans/gattii NOT DETECTED NOT DETECTED Final   CTX-M ESBL NOT DETECTED NOT DETECTED Final   Carbapenem resistance IMP NOT DETECTED NOT DETECTED Final   Carbapenem resistance KPC NOT DETECTED NOT DETECTED Final   Carbapenem resistance NDM NOT DETECTED NOT DETECTED Final   Carbapenem resist OXA 48 LIKE NOT DETECTED NOT DETECTED Final   Carbapenem resistance VIM NOT DETECTED NOT DETECTED Final    Comment: Performed at [facility], [address] [place], [place]  [address]  Urine Culture     Status: Abnormal   Collection Time: 08/30/23  9:48 AM   Specimen: Urine, Random  Result Value Ref Range Status   Specimen Description   Final    URINE, RANDOM Performed at [facility], [address] [place], [place] [address]    Special Requests   Final    NONE Reflexed from [id number] Performed at [facility], [address] [place], [place] [address]    Culture >=100,000 COLONIES/mL ESCHERICHIA COLI (A)  Final   Report Status 09/01/2023 FINAL  Final   Organism ID, Bacteria ESCHERICHIA COLI (A)  Final      Susceptibility   Escherichia coli - MIC*    AMPICILLIN >=32 RESISTANT Resistant     CEFAZOLIN <=4 SENSITIVE Sensitive     CEFEPIME <=0.12 SENSITIVE Sensitive     CEFTRIAXONE <=0.25 SENSITIVE Sensitive     CIPROFLOXACIN <=0.25 SENSITIVE Sensitive     GENTAMICIN <=1 SENSITIVE Sensitive     IMIPENEM <=0.25 SENSITIVE Sensitive     NITROFURANTOIN <=16 SENSITIVE Sensitive     TRIMETH/SULFA <=20 SENSITIVE Sensitive     AMPICILLIN/SULBACTAM 16 INTERMEDIATE Intermediate     PIP/TAZO <=4 SENSITIVE Sensitive ug/mL    * >=100,000 COLONIES/mL ESCHERICHIA COLI  MRSA Next Gen by PCR, Nasal     Status: Abnormal   Collection Time: 08/30/23  6:58 PM   Specimen: Nasal Mucosa; Nasal Swab  Result Value Ref Range Status   MRSA by PCR Next Gen DETECTED (A) NOT DETECTED Final    Comment: CRITICAL RESULT CALLED TO, READ BACK BY AND VERIFIED WITH:  Encarnacion Slates [facility] AT 2127 08/30/23 JG (NOTE) The GeneXpert MRSA Assay (FDA approved for NASAL specimens only), is one component of a comprehensive MRSA colonization surveillance program. It is not intended to diagnose MRSA infection nor to  guide or monitor treatment for MRSA infections. Test performance is not FDA approved in patients less than 22 years old. Performed at [facility], [address] [place], [place] [address]   Culture, blood (Routine X 2) w Reflex to ID Panel     Status: None  (Preliminary result)   Collection Time: 09/01/23  8:28 AM   Specimen: BLOOD LEFT HAND  Result Value Ref Range Status   Specimen Description BLOOD LEFT HAND  Final   Special Requests   Final    BOTTLES DRAWN AEROBIC AND ANAEROBIC Blood Culture adequate volume   Culture   Final    NO GROWTH < 24 HOURS Performed at [facility], [address], [place], [place] [address]    Report Status PENDING  Incomplete  Culture, blood (Routine X 2) w Reflex to ID Panel     Status: None (Preliminary result)   Collection Time: 09/01/23  8:37 AM   Specimen: BLOOD RIGHT ARM  Result Value Ref Range Status   Specimen Description BLOOD RIGHT ARM  Final   Special Requests   Final    BOTTLES DRAWN AEROBIC AND ANAEROBIC Blood Culture adequate volume   Culture   Final    NO GROWTH < 24 HOURS Performed at [facility], [address] [place], [place] [address]    Report Status PENDING  Incomplete     Time coordinating discharge: Over 30 minutes  SIGNED:   Charise Killian, MD  [facility] 09/02/2023, 1:04 PM Pager   If 7PM-7AM, please contact night-coverage [email or web address]
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
## 2023-09-05 ENCOUNTER — Encounter: Payer: Medicare HMO | Admitting: Family

## 2023-09-05 ENCOUNTER — Telehealth: Payer: Self-pay | Admitting: Family

## 2023-09-05 NOTE — Telephone Encounter (Signed)
Patient did not show for her [facility] appointment on 09/05/23.

## 2023-09-06 LAB — CULTURE, BLOOD (ROUTINE X 2)
Culture: NO GROWTH
Culture: NO GROWTH
Special Requests: ADEQUATE
Special Requests: ADEQUATE
